# Patient Record
Sex: Male | Born: 1935 | Race: White | Hispanic: No | Smoking: Former smoker
Health system: Southern US, Community
[De-identification: ages and names within clinical notes are randomized; demographics above are authoritative.]

## PROBLEM LIST (undated history)

## (undated) DIAGNOSIS — K8020 Calculus of gallbladder without cholecystitis without obstruction: Secondary | ICD-10-CM

## (undated) DIAGNOSIS — F329 Major depressive disorder, single episode, unspecified: Secondary | ICD-10-CM

## (undated) DIAGNOSIS — K219 Gastro-esophageal reflux disease without esophagitis: Secondary | ICD-10-CM

## (undated) DIAGNOSIS — F419 Anxiety disorder, unspecified: Secondary | ICD-10-CM

## (undated) DIAGNOSIS — I2510 Atherosclerotic heart disease of native coronary artery without angina pectoris: Secondary | ICD-10-CM

## (undated) DIAGNOSIS — I639 Cerebral infarction, unspecified: Secondary | ICD-10-CM

## (undated) DIAGNOSIS — J449 Chronic obstructive pulmonary disease, unspecified: Secondary | ICD-10-CM

## (undated) DIAGNOSIS — N189 Chronic kidney disease, unspecified: Secondary | ICD-10-CM

## (undated) DIAGNOSIS — I10 Essential (primary) hypertension: Secondary | ICD-10-CM

## (undated) DIAGNOSIS — M549 Dorsalgia, unspecified: Secondary | ICD-10-CM

## (undated) DIAGNOSIS — R4189 Other symptoms and signs involving cognitive functions and awareness: Secondary | ICD-10-CM

## (undated) DIAGNOSIS — I219 Acute myocardial infarction, unspecified: Secondary | ICD-10-CM

## (undated) DIAGNOSIS — E785 Hyperlipidemia, unspecified: Secondary | ICD-10-CM

## (undated) DIAGNOSIS — R51 Headache: Secondary | ICD-10-CM

## (undated) HISTORY — DX: Calculus of gallbladder without cholecystitis without obstruction: K80.20

## (undated) HISTORY — DX: Gastro-esophageal reflux disease without esophagitis: K21.9

## (undated) HISTORY — DX: Headache: R51

## (undated) HISTORY — DX: Atherosclerotic heart disease of native coronary artery without angina pectoris: I25.10

## (undated) HISTORY — DX: Other symptoms and signs involving cognitive functions and awareness: R41.89

## (undated) HISTORY — DX: Major depressive disorder, single episode, unspecified: F32.9

## (undated) HISTORY — DX: Essential (primary) hypertension: I10

## (undated) HISTORY — DX: Dorsalgia, unspecified: M54.9

## (undated) HISTORY — DX: Chronic obstructive pulmonary disease, unspecified: J44.9

## (undated) HISTORY — DX: Anxiety disorder, unspecified: F41.9

## (undated) HISTORY — DX: Hyperlipidemia, unspecified: E78.5

---

## 1994-09-20 DIAGNOSIS — F419 Anxiety disorder, unspecified: Secondary | ICD-10-CM

## 1994-09-20 HISTORY — DX: Anxiety disorder, unspecified: F41.9

## 1995-09-21 HISTORY — PX: CORONARY STENT PLACEMENT: SHX1402

## 1998-01-02 ENCOUNTER — Ambulatory Visit (HOSPITAL_COMMUNITY): Admission: RE | Admit: 1998-01-02 | Discharge: 1998-01-02 | Payer: Self-pay | Admitting: Family Medicine

## 2001-08-10 ENCOUNTER — Encounter: Payer: Self-pay | Admitting: Family Medicine

## 2001-08-10 ENCOUNTER — Ambulatory Visit (HOSPITAL_COMMUNITY): Admission: RE | Admit: 2001-08-10 | Discharge: 2001-08-10 | Payer: Self-pay | Admitting: Family Medicine

## 2002-09-20 HISTORY — PX: CORONARY STENT PLACEMENT: SHX1402

## 2003-05-22 ENCOUNTER — Encounter: Payer: Self-pay | Admitting: Emergency Medicine

## 2003-05-22 ENCOUNTER — Inpatient Hospital Stay (HOSPITAL_COMMUNITY): Admission: EM | Admit: 2003-05-22 | Discharge: 2003-05-24 | Payer: Self-pay | Admitting: Emergency Medicine

## 2004-11-27 ENCOUNTER — Ambulatory Visit: Payer: Self-pay | Admitting: Pulmonary Disease

## 2004-12-24 ENCOUNTER — Ambulatory Visit: Payer: Self-pay | Admitting: Cardiology

## 2005-02-22 ENCOUNTER — Ambulatory Visit: Payer: Self-pay | Admitting: Pulmonary Disease

## 2006-01-19 ENCOUNTER — Ambulatory Visit: Payer: Self-pay | Admitting: Cardiology

## 2006-01-31 ENCOUNTER — Ambulatory Visit: Payer: Self-pay | Admitting: Pulmonary Disease

## 2006-10-17 ENCOUNTER — Ambulatory Visit (HOSPITAL_COMMUNITY): Admission: RE | Admit: 2006-10-17 | Discharge: 2006-10-17 | Payer: Self-pay | Admitting: Nephrology

## 2007-05-03 ENCOUNTER — Ambulatory Visit: Payer: Self-pay | Admitting: Cardiology

## 2008-05-08 ENCOUNTER — Ambulatory Visit: Payer: Self-pay | Admitting: Cardiology

## 2008-12-18 DIAGNOSIS — N200 Calculus of kidney: Secondary | ICD-10-CM | POA: Insufficient documentation

## 2008-12-18 DIAGNOSIS — F17200 Nicotine dependence, unspecified, uncomplicated: Secondary | ICD-10-CM

## 2008-12-18 DIAGNOSIS — I10 Essential (primary) hypertension: Secondary | ICD-10-CM

## 2008-12-18 DIAGNOSIS — I472 Ventricular tachycardia, unspecified: Secondary | ICD-10-CM | POA: Insufficient documentation

## 2008-12-19 ENCOUNTER — Ambulatory Visit: Payer: Self-pay | Admitting: Internal Medicine

## 2008-12-19 DIAGNOSIS — R0989 Other specified symptoms and signs involving the circulatory and respiratory systems: Secondary | ICD-10-CM

## 2008-12-19 DIAGNOSIS — R0609 Other forms of dyspnea: Secondary | ICD-10-CM

## 2008-12-19 DIAGNOSIS — J449 Chronic obstructive pulmonary disease, unspecified: Secondary | ICD-10-CM

## 2009-01-13 ENCOUNTER — Telehealth (INDEPENDENT_AMBULATORY_CARE_PROVIDER_SITE_OTHER): Payer: Self-pay | Admitting: *Deleted

## 2009-03-03 ENCOUNTER — Ambulatory Visit: Payer: Self-pay | Admitting: Internal Medicine

## 2009-03-03 DIAGNOSIS — R635 Abnormal weight gain: Secondary | ICD-10-CM

## 2009-05-29 ENCOUNTER — Ambulatory Visit: Payer: Self-pay | Admitting: Cardiology

## 2009-05-29 DIAGNOSIS — M549 Dorsalgia, unspecified: Secondary | ICD-10-CM | POA: Insufficient documentation

## 2009-07-21 ENCOUNTER — Encounter: Payer: Self-pay | Admitting: Cardiovascular Disease

## 2009-09-20 DIAGNOSIS — R4189 Other symptoms and signs involving cognitive functions and awareness: Secondary | ICD-10-CM

## 2009-09-20 HISTORY — DX: Other symptoms and signs involving cognitive functions and awareness: R41.89

## 2009-11-26 ENCOUNTER — Encounter: Payer: Self-pay | Admitting: Cardiovascular Disease

## 2010-03-09 ENCOUNTER — Ambulatory Visit: Payer: Self-pay | Admitting: Cardiovascular Disease

## 2010-03-25 ENCOUNTER — Encounter: Payer: Self-pay | Admitting: Cardiovascular Disease

## 2010-08-27 ENCOUNTER — Encounter: Payer: Self-pay | Admitting: Adult Health

## 2010-08-27 ENCOUNTER — Ambulatory Visit: Payer: Self-pay | Admitting: Cardiology

## 2010-08-31 ENCOUNTER — Telehealth (INDEPENDENT_AMBULATORY_CARE_PROVIDER_SITE_OTHER): Payer: Self-pay | Admitting: *Deleted

## 2010-09-08 ENCOUNTER — Encounter (INDEPENDENT_AMBULATORY_CARE_PROVIDER_SITE_OTHER): Payer: Self-pay | Admitting: *Deleted

## 2010-09-08 LAB — CONVERTED CEMR LAB
Albumin: 4.1 g/dL
BUN: 31 mg/dL
CO2: 21 meq/L
Chloride: 106 meq/L
Creatinine, Ser: 1.44 mg/dL
Total Protein: 7.1 g/dL

## 2010-10-11 ENCOUNTER — Encounter: Payer: Self-pay | Admitting: Nephrology

## 2010-10-20 NOTE — Assessment & Plan Note (Signed)
Summary: W1X   Visit Type:  Follow-up Referring Provider:  Self- Former Dr Sung Amabile pt. Primary Provider:  Dr. [name]  CC:  occasional sob.  History of Present Illness: Terry Flynn is seen today for F/U of CAD, Hypertension, elevated lipids and SOB.  He is a patient of Dr. Antoine Poche.  He has a history of stenting of the RCA I believe back in 2004.  He continues to smoke and has seen Dr. Sherene Sires for COPD.  His activity is severely limited by lower back and hip problems.  He is getting injections by Dr. Ethelene Hal at this point.  He would need a Dobutamine Myovue  to clear him for any major surgery in the future.  He is not having any significant SSCP but he can only walk about 20 ft because of his back and hip pain.  He breathing has been stable with no cough fever or sputum production.  He has been compliant with his meds  Current Problems (verified): 1)  Back Pain  (ICD-724.5) 2)  Weight Gain, Abnormal  (ICD-783.1) 3)  COPD Unspecified  (ICD-496) 4)  Dyspnea  (ICD-786.09) 5)  Nephrolithiasis  (ICD-592.0) 6)  Inguinal Hernia  (ICD-550.90) 7)  Hypertension  (ICD-401.9) 8)  Tobacco Abuse  (ICD-305.1) 9)  Dyslipidemia  (ICD-272.4) 10)  Ventricular Tachycardia  (ICD-427.1) 11)  Coronary Artery Disease  (ICD-414.00)  Current Medications (verified): 1)  Toprol Xl 25 Mg Xr24h-Tab (Metoprolol Succinate) .... Take 1 Tablet By Mouth Once A Day 2)  Colchicine 0.6 Mg Tabs (Colchicine) .Marland Kitchen.. 1 Once Daily As Needed 3)  Symbicort 160-4.5 Mcg/act  Aero (Budesonide-Formoterol Fumarate) .... 2 Puffs First Thing  in Am and 2 Puffs Again in Pm About 12 Hours Later 4)  Lorazepam 2 Mg Tabs (Lorazepam) .... Take 1 Tab Three Times A Day 5)  Proair Hfa 108 (90 Base) Mcg/act Aers (Albuterol Sulfate) .... Inhale 2 Puff Using Inhaler Every Four Hours 6)  Diovan 320 Mg Tabs (Valsartan) .... Take 1 Tab Daily 7)  Uloric 80 Mg Tabs (Febuxostat) .... Take 1 Tab Daily  Allergies (verified): No Known Drug Allergies  Past  History:  Past Medical History: Last updated: 03/03/2009 NEPHROLITHIASIS (ICD-592.0) INGUINAL HERNIA (ICD-550.90) HYPERTENSION (ICD-401.9) TOBACCO ABUSE (ICD-305.1) DYSLIPIDEMIA (ICD-272.4) VENTRICULAR TACHYCARDIA (ICD-427.1) CORONARY ARTERY DISEASE (ICD-414.00) COPD    - PFT's 02/22/05  FEV1 2.04 (76%) ratio 64  21% better after B2,  DLC0 78%     - PFT's 03/03/09          1.57(62%) ratio 58   11% better after B2 (after am symbicort) and DLC0 65  Past Surgical History: Last updated: 12/19/2008 Stent x 4  Family History: Last updated: 12/19/2008 Asthma- Son Heart Dz- 2 Brothers Throat CA- Father (smoker)  Social History: Last updated: 12/19/2008 Current smoker since age 45.  Smokes 1/2 to 1 ppd. Widowed Children Son lives with pt. Retired from [employer]  Review of Systems       Denies fever, malais, weight loss, blurry vision, decreased visual acuity, cough, sputum, SOB, hemoptysis, pleuritic pain, palpitaitons, heartburn, abdominal pain, melena, lower extremity edema, claudication, or rash.   Vital Signs:  Patient profile:   77 year old male Weight:      207 pounds BMI:     24.64 Pulse rate:   72 / minute BP sitting:   166 / 82  (right arm)  Vitals Entered By: Dreama Saa, CNA (March 09, 2010 9:24 AM)   Physical Exam  General:  Affect appropriate Healthy:  appears stated age HEENT: normal Neck supple with no adenopathy JVP normal no bruits no thyromegaly Lungs clear with no wheezing and good diaphragmatic motion Heart:  S1/S2 no murmur,rub, gallop or click PMI normal Abdomen: benighn, BS positve, no tenderness, no AAA no bruit.  No HSM or HJR Distal pulses intact with no bruits No edema Neuro non-focal Skin warm and dry    Impression & Recommendations:  Problem # 1:  CORONARY ARTERY DISEASE (ICD-414.00)  Stable no angina  continue ASA and BB His updated medication list for this problem includes:    Toprol Xl 25 Mg Xr24h-tab  (Metoprolol succinate) .Marland Kitchen... Take 1 tablet by mouth once a day  His updated medication list for this problem includes:    Toprol Xl 25 Mg Xr24h-tab (Metoprolol succinate) .Marland Kitchen... Take 1 tablet by mouth once a day  Problem # 2:  COPD UNSPECIFIED (ICD-496) Refills for inhalers called in .  f/U with [facility] as needed His updated medication list for this problem includes:    Symbicort 160-4.5 Mcg/act Aero (Budesonide-formoterol fumarate) .Marland Kitchen... 2 puffs first thing  in am and 2 puffs again in pm about 12 hours later    Proair Hfa 108 (90 Base) Mcg/act Aers (Albuterol sulfate) ..... Inhale 2 puff using inhaler every four hours  Problem # 3:  HYPERTENSION (ICD-401.9) Well controlled continue low sodium diet The following medications were removed from the medication list:    Benicar Hct 40-25 Mg Tabs (Olmesartan medoxomil-hctz) .Marland Kitchen... Take 1 tablet by mouth once a day His updated medication list for this problem includes:    Toprol Xl 25 Mg Xr24h-tab (Metoprolol succinate) .Marland Kitchen... Take 1 tablet by mouth once a day    Diovan 320 Mg Tabs (Valsartan) .Marland Kitchen... Take 1 tab daily  Problem # 4:  TOBACCO ABUSE (ICD-305.1) Counseled for less than 10 minutes regarding cessation.  No motivation to quit despite COPD  Problem # 5:  BACK PAIN (ICD-724.5) Arthritis in knees and back.  Ibupofen helps for now.  Meloxicam or other Cox-2 inhibitor may be needed in future.  f/U Roe Coombs Moore's office  Patient Instructions: 1)  Your physician recommends that you schedule a follow-up appointment in: 6 months 2)  Your physician recommends that you continue on your current medications as directed. Please refer to the Current Medication list given to you today. Prescriptions: PROAIR HFA 108 (90 BASE) MCG/ACT AERS (ALBUTEROL SULFATE) Inhale 2 puff using inhaler every four hours  #1 x 5   Entered by:   Larita Fife Via LPN   Authorized by:   Colon Branch, MD, [facility]   Signed by:   Larita Fife Via LPN on 60/45/4098   Method used:    [facility] to        [facility] [phone]* (retail)       [address]       [place], [place]  [address]       Ph: [phone] or [phone]       Fax: [phone]   RxID:   [id number] SYMBICORT 160-4.5 MCG/ACT  AERO (BUDESONIDE-FORMOTEROL FUMARATE) 2 puffs first thing  in am and 2 puffs again in pm about 12 hours later  #1 x 5   Entered by:   Larita Fife Via LPN   Authorized by:   Colon Branch, MD, [facility]   Signed by:   Larita Fife Via LPN on 56/38/7564   Method used:   [facility]  [address] [phone]* (retail)       [address]       [place], [place]  [address]       Ph: [phone] or [phone]       Fax: [phone]   RxID:   [id number]

## 2010-10-20 NOTE — Assessment & Plan Note (Signed)
Summary: Z6X   Visit Type:  Follow-up Referring Saliha Salts:  Self- Former Dr Sung Amabile pt. Primary Nayzeth Altman:  Dr. [name]  CC:  no cardiology complaints.  History of Present Illness: [name] is a  77 y/o CM with known history of CAD (stent to RCA 2004), hypertension, chronic back pain.and COPD.  Hecomes today for 6 months follow up for continued assessment and treatment of cardiac issues.  He denies any chest discomfort, DOE or weakness.  He is just getting over Gout and continues to have some back pain.  Otherwise he is without cardiac complaints. He is intolerant to statins causing myalgias.  Current Medications (verified): 1)  Toprol Xl 25 Mg Xr24h-Tab (Metoprolol Succinate) .... Take 1 Tablet By Mouth Once A Day 2)  Lorazepam 2 Mg Tabs (Lorazepam) .... Take 1 Tab Three Times A Day 3)  Proair Hfa 108 (90 Base) Mcg/act Aers (Albuterol Sulfate) .... Inhale 2 Puff Using Inhaler Every Four Hours 4)  Diovan 320 Mg Tabs (Valsartan) .... Take 1 Tab Daily 5)  Uloric 80 Mg Tabs (Febuxostat) .... Take 1 Tab Daily 6)  Indomethacin 25 Mg Caps (Indomethacin) .... Take 1 Tab Daily 7)  Aspirin 81 Mg Tbec (Aspirin) .... Take One Tablet By Mouth Daily 8)  Amlodipine Besylate 5 Mg Tabs (Amlodipine Besylate) .... Take One Tablet By Mouth Daily 9)  Fish Oil 1000 Mg Caps (Omega-3 Fatty Acids) .... Take 1 Tablet By Mouth Once A Day  Allergies (verified): 1)  ! * Statin  Comments:  Nurse/Medical Assistant: patient t brought meds he is no longer on colchicine or symbicort and doesn't take asa PMH-FH-SH reviewed-no changes except otherwise noted  Review of Systems       Gout pain, chronic back pain.  All other systems have been reviewed and are negative unless stated above.   Vital Signs:  Patient profile:   77 year old male Weight:      208 pounds O2 Sat:      94 % on Room air Pulse rate:   72 / minute BP sitting:   160 / 90  (right arm)  Vitals Entered By: Dreama Saa, CNA (August 27, 2010 11:02 AM)  O2 Flow:  Room air  Physical Exam  General:  Well developed, well nourished, in no acute distress. Lungs:  Clear bilaterally to auscultation and percussion. Heart:  Non-displaced PMI, chest non-tender; regular rate and rhythm, S1, S2 without murmurs, rubs or gallops. Carotid upstroke normal, no bruit. Normal abdominal aortic size, no bruits. Femorals normal pulses, no bruits. Pedals normal pulses. No edema, no varicosities. Abdomen:  Obese, NT 2+ bowel sounds Msk:  joint tenderness.  Right foot Pulses:  pulses normal in all 4 extremities Extremities:  No clubbing or cyanosis. Neurologic:  Alert and oriented x 3. Psych:  Normal affect.   EKG  Procedure date:  08/27/2010  Findings:      Normal sinus rhythm with rate of:  67 bpm  Impression & Recommendations:  Problem # 1:  HYPERTENSION (ICD-401.9)  Not welll controlled in patient with known CAD. Past BP was elevated in June.  Would not increase toprol as he has COPD, and is at low normal HR of 67.  Will begin Norvasc 5 mg daily. He will follow-up and have BP recheck in 1 week. His updated medication list for this problem includes:    Toprol Xl 25 Mg Xr24h-tab (Metoprolol succinate) .Marland Kitchen... Take 1 tablet by mouth once a day    Diovan 320  Mg Tabs (Valsartan) .Marland Kitchen... Take 1 tab daily    Aspirin 81 Mg Tbec (Aspirin) .Marland Kitchen... Take one tablet by mouth daily    Amlodipine Besylate 5 Mg Tabs (Amlodipine besylate) .Marland Kitchen... Take one tablet by mouth daily  Problem # 2:  DYSLIPIDEMIA (ICD-272.4) Intolerant to statins causing myalgias.  Will start fish oil 100mg  daily and instructed on low cholesterol diet.  Patient Instructions: 1)  Your physician recommends that you schedule a follow-up appointment in: 3 months. 2)  Your physician has recommended you make the following change in your medication: fish oil 1000mg  1 tablet daily, amlodipine 5mg  daily, asprin 81mg  daily 3)  You have been referred to 1 week nurse visit for BP  check Prescriptions: AMLODIPINE BESYLATE 5 MG TABS (AMLODIPINE BESYLATE) Take one tablet by mouth daily  #30 x 3   Entered by:   Teressa Lower RN   Authorized by:   Joni Reining, NP   Signed by:   Teressa Lower RN on 08/27/2010   Method used:   [facility]        [facility] [phone][facility] (retail)       [address]       [place], [place]  [address]       Ph: [phone] or [phone]       Fax: [phone]   RxID:   [id number]

## 2010-10-22 NOTE — Progress Notes (Signed)
Summary: Terry Flynn was told to call today with bp reading  Phone Note Call from Patient Call back at Home Phone [phone]   Caller: Terry Flynn Reason for Call: Talk to Nurse Summary of Call: Terry Flynn said he was told to call today after getting bp check his bp is 180/90. Terry Flynn has it checked at dr Christell Constant office. Initial call taken by: Faythe Ghee,  August 31, 2010 12:59 PM  Follow-up for Phone Call        [facility] Teressa Lower RN  September 01, 2010 9:32 AM Terry Flynn is scheduled to see Dr. Christell Constant on  Monday 09/07/2010 and will  have them send [place] the office note with bp readings  Follow-up by: Teressa Lower RN,  August 31, 2010 4:58 PM  Additional Follow-up for Phone Call Additional follow up Details #1::        S: Terry Flynn was to have a nurse visit yesterday B: office visit on 08/27/2010, started on fish oil 1000mg  once daily, amlodipine 5mg  daily and asa 81mg  daily A: Terry Flynn denies c/o, refused to come to nurse visit went to Dr. Kathi Der office for bp check and called our office to do visit over the phone. R: awaiting your recommendations Additional Follow-up by: Teressa Lower RN,  September 01, 2010 1:17 PM    Additional Follow-up for Phone Call Additional follow up Details #2::    Increase Norvasc to 10mg  daily.  Repeat BP check in one week for evaluation.    Follow-up by: Joni Reining NP  Additional Follow-up for Phone Call Additional follow up Details #3:: Details for Additional Follow-up Action Taken: lmom   Teressa Lower RN  September 02, 2010 1:31 PM   New/Updated Medications: AMLODIPINE BESYLATE 10 MG TABS (AMLODIPINE BESYLATE) Take one tablet by mouth daily Prescriptions: AMLODIPINE BESYLATE 10 MG TABS (AMLODIPINE BESYLATE) Take one tablet by mouth daily  #30 x 3   Entered by:   Teressa Lower RN   Authorized by:   Joni Reining, NP   Signed by:   Teressa Lower RN on 09/02/2010   Method used:   [facility] to        [facility] [phone]* (retail)       [address]       [place], [place]  [address]       Ph: [phone] or [phone]       Fax: [phone]   RxID:   [id number]

## 2010-11-25 ENCOUNTER — Encounter: Payer: Self-pay | Admitting: Cardiovascular Disease

## 2010-11-25 ENCOUNTER — Ambulatory Visit (INDEPENDENT_AMBULATORY_CARE_PROVIDER_SITE_OTHER): Payer: BC Managed Care – PPO | Admitting: Cardiovascular Disease

## 2010-11-25 DIAGNOSIS — I10 Essential (primary) hypertension: Secondary | ICD-10-CM

## 2010-11-25 DIAGNOSIS — E785 Hyperlipidemia, unspecified: Secondary | ICD-10-CM

## 2010-11-25 DIAGNOSIS — R519 Headache, unspecified: Secondary | ICD-10-CM | POA: Insufficient documentation

## 2010-11-25 DIAGNOSIS — I2510 Atherosclerotic heart disease of native coronary artery without angina pectoris: Secondary | ICD-10-CM

## 2010-11-25 DIAGNOSIS — K219 Gastro-esophageal reflux disease without esophagitis: Secondary | ICD-10-CM

## 2010-11-25 DIAGNOSIS — R51 Headache: Secondary | ICD-10-CM | POA: Insufficient documentation

## 2010-11-25 NOTE — Assessment & Plan Note (Signed)
No angina  Continue current medical Rx

## 2010-11-25 NOTE — Assessment & Plan Note (Signed)
Good control  With diet  Labs with pirmary.  No statins due to myalgias

## 2010-11-25 NOTE — Assessment & Plan Note (Signed)
OTC maalox and pepcid  F/U Dr Christell Constant

## 2010-11-25 NOTE — Assessment & Plan Note (Signed)
Improved with higher dose diovan

## 2010-11-25 NOTE — Patient Instructions (Signed)
F/U Dr Eden Emms 6 months Eye doctor tomorrow F/U primary Dr Christell Constant Maalox and OTC pepcid for GERD

## 2010-11-25 NOTE — Assessment & Plan Note (Signed)
Nonfocal exam with improved BP  Eye doctor tomorrow to R/O glaucoma  And F/U Dr Christell Constant

## 2010-11-25 NOTE — Progress Notes (Signed)
Terry Flynn is a  77 y/o CM with known history of CAD (stent to RCA 2004), hypertension, chronic back pain.and COPD.  He  comes today for 6 months follow up for continued assessment and treatment of cardiac issues.  He denies any chest discomfort, DOE or weakness.  He is just getting over Gout and continues to have some back pain.  Otherwise he is without cardiac complaints. He is intolerant to statins causing myalgias.  Has had headache since Saturday.  Has appt with eye doctor tomorrow to R/O glaucoma.  BP improved on higher dos Diovan.  Also has GERD no change in bowel habits nausea vomiting or melena.  Has F/U with Dr Christell Constant in [place] soon.    ROS: Denies fever, malais, weight loss, blurry vision, decreased visual acuity, cough, sputum, SOB, hemoptysis, pleuritic pain, palpitaitons, heartburn, abdominal pain, melena, lower extremity edema, claudication, or rash.   General:  Affect appropriate Healthy:  appears stated age HEENT: normal Neck supple with no adenopathy JVP normal no bruits no thyromegaly Lungs clear with no wheezing and good diaphragmatic motion Heart:  S1/S2 no murmur,rub, gallop or click PMI normal Abdomen: benighn, BS positve, no tenderness, no AAA no bruit.  No HSM or HJR Distal pulses intact with no bruits No edema Neuro non-focal Skin warm and dry No muscular weakness   Current outpatient prescriptions:albuterol (PROAIR HFA) 108 (90 BASE) MCG/ACT inhaler, Inhale 2 puffs into the lungs every 6 (six) hours as needed.  , Disp: , Rfl: ;  amLODipine (NORVASC) 10 MG tablet, Take 10 mg by mouth daily.  , Disp: , Rfl: ;  aspirin 81 MG chewable tablet, Chew 81 mg by mouth daily.  , Disp: , Rfl: ;  Febuxostat 80 MG TABS, Take 80 mg by mouth daily.  , Disp: , Rfl:  indomethacin (INDOCIN) 25 MG capsule, Take 25 mg by mouth 2 (two) times daily with a meal.  , Disp: , Rfl: ;  metoprolol succinate (TOPROL-XL) 25 MG 24 hr tablet, Take 25 mg by mouth daily.  , Disp: , Rfl: ;  valsartan  (DIOVAN) 320 MG tablet, Take 320 mg by mouth daily.  , Disp: , Rfl:  Statins   Allergies: Statins

## 2010-12-01 NOTE — Letter (Signed)
Summary: Work Writer at [employer]  [address], [place] [address]   Phone: [phone]  Fax: [phone]     November 25, 2010    Terry Flynn   The above named patient had a medical visit today at:  2:00pm and was brought to   clinic by Francoise Ceo. Please take this into consideration when reviewing the   time away from work.      Sincerely yours,  Architectural technologist

## 2010-12-01 NOTE — Assessment & Plan Note (Signed)
Summary: 3 mth f/u per pt checkout//tmj/tg   Visit Type:  Follow-up Referring Provider:  Self- Former Dr Sung Amabile pt. Primary Provider:  Dr. [name]  CC:  headache.  History of Present Illness: Terry Flynn is a  77 y/o CM with known history of CAD (stent to RCA 2004), hypertension, chronic back pain.and COPD.  Hecomes today for 6 months follow up for continued assessment and treatment of cardiac issues.  He denies any chest discomfort, DOE or weakness.  He is just getting over Gout and continues to have some back pain.  Otherwise he is without cardiac complaints. He is intolerant to statins causing myalgias.  Has had headache since Saturday.  Seeing the eye doctor tomorrow.  Also some epigastric pain and GERD.  No nausea, vohmiting melena or change in bowel habits.  BP better controlled on higher dose Diovan  Current Problems (verified): 1)  Back Pain  (ICD-724.5) 2)  Weight Gain, Abnormal  (ICD-783.1) 3)  COPD Unspecified  (ICD-496) 4)  Dyspnea  (ICD-786.09) 5)  Nephrolithiasis  (ICD-592.0) 6)  Inguinal Hernia  (ICD-550.90) 7)  Hypertension  (ICD-401.9) 8)  Tobacco Abuse  (ICD-305.1) 9)  Dyslipidemia  (ICD-272.4) 10)  Ventricular Tachycardia  (ICD-427.1) 11)  Coronary Artery Disease  (ICD-414.00)  Current Medications (verified): 1)  Toprol Xl 25 Mg Xr24h-Tab (Metoprolol Succinate) .... Take 1 Tablet By Mouth Once A Day 2)  Lorazepam 2 Mg Tabs (Lorazepam) .... Take 1 Tab Three Times A Day 3)  Proair Hfa 108 (90 Base) Mcg/act Aers (Albuterol Sulfate) .... Inhale 2 Puff Using Inhaler Every Four Hours 4)  Diovan 320 Mg Tabs (Valsartan) .... Take 1 Tab Daily 5)  Uloric 80 Mg Tabs (Febuxostat) .... Take 1 Tab Daily 6)  Indomethacin 25 Mg Caps (Indomethacin) .... Take 1 Tab Daily 7)  Aspirin 81 Mg Tbec (Aspirin) .... Take One Tablet By Mouth Daily 8)  Amlodipine Besylate 10 Mg Tabs (Amlodipine Besylate) .... Take One Tablet By Mouth Daily 9)  Fish Oil 1000 Mg Caps (Omega-3 Fatty  Acids) .... Take 1 Tablet By Mouth Once A Day  Allergies (verified): 1)  ! * Statin  Comments:  Nurse/Medical Assistant: no meds no list [facility] [place]  Past History:  Past Medical History: Last updated: 03/03/2009 NEPHROLITHIASIS (ICD-592.0) INGUINAL HERNIA (ICD-550.90) HYPERTENSION (ICD-401.9) TOBACCO ABUSE (ICD-305.1) DYSLIPIDEMIA (ICD-272.4) VENTRICULAR TACHYCARDIA (ICD-427.1) CORONARY ARTERY DISEASE (ICD-414.00) COPD    - PFT's 02/22/05  FEV1 2.04 (76%) ratio 64  21% better after B2,  DLC0 78%     - PFT's 03/03/09          1.57(62%) ratio 58   11% better after B2 (after am symbicort) and DLC0 65  Past Surgical History: Last updated: 12/19/2008 Stent x 4  Family History: Last updated: 12/19/2008 Asthma- Son Heart Dz- 2 Brothers Throat CA- Father (smoker)  Social History: Last updated: 12/19/2008 Current smoker since age 52.  Smokes 1/2 to 1 ppd. Widowed Children Son lives with pt. Retired from [employer]  Review of Systems       Denies fever, malais, weight loss, blurry vision, decreased visual acuity, cough, sputum, SOB, hemoptysis, pleuritic pain, palpitaitons, , abdominal pain, melena, lower extremity edema, claudication, or rash.   Vital Signs:  Patient profile:   77 year old male Weight:      204 pounds BMI:     24.28 Pulse rate:   68 / minute BP sitting:   132 / 79  (left arm)  Vitals Entered By: Dreama Saa,  CNA (November 25, 2010 1:55 PM)  Physical Exam  General:  Affect appropriate Healthy:  appears stated age HEENT: normal Neck supple with no adenopathy JVP normal no bruits no thyromegaly Lungs clear with no wheezing and good diaphragmatic motion Heart:  S1/S2 no murmur,rub, gallop or click PMI normal Abdomen: benighn, BS positve, no tenderness, no AAA no bruit.  No HSM or HJR Distal pulses intact with no bruits No edema Neuro non-focal Skin warm and dry    Impression & Recommendations:  Problem # 1:  HYPERTENSION  (ICD-401.9)  Improved with higher dose diovan His updated medication list for this problem includes:    Toprol Xl 25 Mg Xr24h-tab (Metoprolol succinate) .Marland Kitchen... Take 1 tablet by mouth once a day    Diovan 320 Mg Tabs (Valsartan) .Marland Kitchen... Take 1 tab daily    Aspirin 81 Mg Tbec (Aspirin) .Marland Kitchen... Take one tablet by mouth daily    Amlodipine Besylate 10 Mg Tabs (Amlodipine besylate) .Marland Kitchen... Take one tablet by mouth daily  His updated medication list for this problem includes:    Toprol Xl 25 Mg Xr24h-tab (Metoprolol succinate) .Marland Kitchen... Take 1 tablet by mouth once a day    Diovan 320 Mg Tabs (Valsartan) .Marland Kitchen... Take 1 tab daily    Aspirin 81 Mg Tbec (Aspirin) .Marland Kitchen... Take one tablet by mouth daily    Amlodipine Besylate 10 Mg Tabs (Amlodipine besylate) .Marland Kitchen... Take one tablet by mouth daily  Problem # 2:  DYSLIPIDEMIA (ICD-272.4) Continue diet Rx Myalgias with statins  Problem # 3:  CORONARY ARTERY DISEASE (ICD-414.00)  Stabel no angina continue asa and BB His updated medication list for this problem includes:    Toprol Xl 25 Mg Xr24h-tab (Metoprolol succinate) .Marland Kitchen... Take 1 tablet by mouth once a day    Aspirin 81 Mg Tbec (Aspirin) .Marland Kitchen... Take one tablet by mouth daily    Amlodipine Besylate 10 Mg Tabs (Amlodipine besylate) .Marland Kitchen... Take one tablet by mouth daily  His updated medication list for this problem includes:    Toprol Xl 25 Mg Xr24h-tab (Metoprolol succinate) .Marland Kitchen... Take 1 tablet by mouth once a day    Aspirin 81 Mg Tbec (Aspirin) .Marland Kitchen... Take one tablet by mouth daily    Amlodipine Besylate 10 Mg Tabs (Amlodipine besylate) .Marland Kitchen... Take one tablet by mouth daily  Problem # 4:  GERD (ICD-530.81) OTC maalox and pepcid  F/U Dr Christell Constant  Problem # 5:  HEADACHE (ICD-784.0) Nonfocal neuro exam  R/O glaucoma at eye doctor tomorrow  F/U Dr Christell Constant His updated medication list for this problem includes:    Toprol Xl 25 Mg Xr24h-tab (Metoprolol succinate) .Marland Kitchen... Take 1 tablet by mouth once a day    Indomethacin 25 Mg  Caps (Indomethacin) .Marland Kitchen... Take 1 tab daily    Aspirin 81 Mg Tbec (Aspirin) .Marland Kitchen... Take one tablet by mouth daily  Patient Instructions: 1)  Your physician recommends that you schedule a follow-up appointment in: 6 months 2)  Your physician has recommended you make the following change in your medication: you may take OTC Mylox or prevacid.

## 2011-02-02 NOTE — Assessment & Plan Note (Signed)
[facility]                            CARDIOLOGY OFFICE NOTE   NAME:Terry Flynn, Terry Flynn                     MRN:          [id number]  DATE:05/03/2007                            DOB:          05/22/1933    PRIMARY:  Dr. Vernon Prey   REASON FOR PRESENTATION:  Evaluate patient with coronary disease.   HISTORY OF PRESENT ILLNESS:  The patient presents for yearly followup.  He is now 77 years old.  He has had no new symptoms since I last saw  him.  He was evaluated by Dr. Kristian Covey for some renal insufficiency.  He  has had some chronic foot pain which he says limits his walking.  He  continues to smoke a few cigarettes a week when he gets stressed.  He  denies any ongoing chest discomfort, neck or arm discomfort.  He has had  no palpitation, presyncope or syncope.  He denies any PND or orthopnea.   PAST MEDICAL HISTORY:  1. Coronary artery disease (Status post stenting to his right coronary      artery in September 2004 with a Taxus stent.  He had an EF of 50%.      The patient had 90% stenosis, 30% proximal LAD stenosis, 90% first      diagonal stenosis as well as 50% obtuse marginal stenosis and 30%      mid circumflex stenosis).  2. Nonsustained ventricular tachycardia during admission.  3. Dyslipidemia.  4. Ongoing tobacco use.  5. Hypertension.  6. Bilateral inguinal hernia repair.  7. Nephrolithiasis.   ALLERGIES:  None.   MEDICATIONS:  1. Toprol-XL 25 mg daily.  2. Lexapro 10 mg q.h.s.  3. Zetia 10 mg daily.  4. Benicar 40/25 daily.   REVIEW OF SYSTEMS:  As stated in the HPI and otherwise negative for  other systems.   PHYSICAL EXAMINATION:  The patient is in no distress.  Blood pressure  112/72, heart rate 64 and regular.  HEENT:  Eyelids unremarkable.  Pupils equal, round, and react to light.  Fundi not visualized.  Oral mucosa unremarkable.  NECK:  No jugular venous distention.  Waveform within normal limits.  Carotid upstroke brisk  and symmetric.  No bruits, no thyromegaly.  LYMPHATICS:  No cervical, axillary or inguinal adenopathy.  LUNGS:  Clear to auscultation bilaterally.  BACK:  No costovertebral angle tenderness.  CHEST:  Unremarkable.  HEAR:  PMI not displaced or sustained.  S1 and S2 within normal limits.  No S3, no S4.  No clicks, rubs, murmurs.  ABDOMEN:  Flat, positive bowel sounds normal in frequent and pitch.  No  bruits, no rebound, no guarding.  No midline pulsatile mass.  No  hepatomegaly, no splenomegaly.  SKIN:  No rashes, no nodules.  EXTREMITIES:  Show 2+ upper pulses, 2+ femorals, 1+ dorsalis pedis and  posterior tibial bilaterally, dependent rubor, no cyanosis, no clubbing.  NEUROLOGIC:  Oriented to person, place, time.  Cranial nerves II-XII  grossly intact.  Motor grossly intact.   EKG:  Sinus rhythm, rate 64, axis within normal limits, intervals within  normal limits, no acute  ST-T wave changes.   ASSESSMENT AND PLAN:  1. Coronary disease.  The patient is having no ongoing symptoms.  No      further cardiovascular testing is suggested.  Will continue to try      to attempt secondary risk reduction.  2. Tobacco.  He understands the need to stop smoking completely,      though he does not think he can do this because he uses it to help      with his emotional stress.  3. Hypertension.  Blood pressure is well controlled and he will      continue the medications as listed.  4. Dyslipidemia.  He has not tolerated statins in the past.  This is      followed by Dr. Christell Constant.  5. Followup.  Will see the patient back in 1 year or sooner if needed.     [name], MD, [facility]  Electronically Signed    JH/MedQ  DD: 05/03/2007  DT: 05/04/2007  Job #: [id number]   cc:   Ernestina Penna, M.D.

## 2011-02-02 NOTE — Assessment & Plan Note (Signed)
[facility]                            CARDIOLOGY OFFICE NOTE   NAME:Terry Flynn, Terry Flynn                     MRN:          [id number]  DATE:05/08/2008                            DOB:          10-26-1932    PRIMARY CARE PHYSICIAN:  Ernestina Penna, MD   REASON FOR PRESENTATION:  Evaluate the patient with coronary artery  disease.   HISTORY OF PRESENT ILLNESS:  The patient presents for yearly followup.  Since I last saw him, he has done well.  He has not had any new  cardiovascular symptoms.  In particular, he has not had any of the chest  discomfort that he had at the time of his previous interventions.  He  has had no chest pressure, neck, or arm discomfort.  He has had no  palpitations, presyncope, or syncope.  He gets no overt shortness of  breath and denies any PND or orthopnea.  He is fairly limited but gets  around [place] and walking to and from his car.  He has some gout in his  feet and heel and spurs.   PAST MEDICAL HISTORY:  1. Coronary artery disease (status post stenting to his right coronary      artery in September 2004 with a Taxus stent.  His EF was 50%.  He      had a 90% stenosis in the RCA, 30% proximal LAD stenosis, 90% first      diagonal stenosis as well as 50% obtuse marginal stenosis, and 30%      mid circumflex stenosis).  2. Nonsustained ventricular tachycardia at the time of that admission.  3. Dyslipidemia.  4. Ongoing tobacco use (a third pack per day).  5. Hypertension.  6. Bilateral inguinal hernia repair.  7. Nephrolithiasis.   ALLERGIES:  None.   MEDICATIONS:  1. Benicar 40/25 daily.  2. Toprol 25 mg daily.  (The patient was on statins and on Zetia but      quit these because of muscle aches).   REVIEW OF SYSTEMS:  As stated in the HPI and negative for all other  systems.   PHYSICAL EXAMINATION:  GENERAL:  The patient is in no distress.  VITAL SIGNS:  Blood pressure 112/72, heart rate 73 and regular,  weight  205 pounds, and body mass index 31.  HEENT:  Eyes are unremarkable.  Pupils equal, round, and reactive to  light.  Fundi not visualized.  Oral mucosa unremarkable.  NECK:  No jugular venous distention at 45 degrees.  Carotid upstroke  brisk and symmetric.  No bruits and no thyromegaly.  LYMPHATICS:  No cervical, axillary, or inguinal adenopathy.  LUNGS:  Clear to auscultation bilaterally.  BACK:  No costovertebral angle tenderness.  CHEST:  Unremarkable.  HEART:  PMI not displaced or sustained.  S1 and S2 within normal limits.  No S3, no S4, no clicks, no rubs, and no murmurs.  ABDOMEN:  Flat,  positive bowel sounds normal in frequency and pitch.  No bruits,  rebound, or guarding.  No midline pulsatile mass.  No hepatomegaly and  no splenomegaly.  SKIN:  No  rashes and no nodules.  EXTREMITIES:  A 2+ upper pulses, 2+ femorals, 1+ dorsalis pedis, and  posterior tibialis bilaterally.  Dependent rubor.  Trace left lower  extremity edema.  No cyanosis or clubbing.  NEURO:  Oriented to person, place, and time.  Cranial nerves II through  XII grossly intact.  Motor grossly intact.   EKG, sinus rhythm, rate 73, axes within normal limits, intervals within  normal limits, and no acute ST-T wave changes.   ASSESSMENT AND PLAN:  1. Coronary artery disease.  The patient is having no overt symptoms.      However, he has a limited functional capacity.  It has been 5 years      since his PTCA.  He needs stress perfusion imaging to further      evaluate his coronaries.  He wants to defer this until after the      first of the year.  He needs continued risk reduction.  2. Tobacco.  I discussed with him the need to stop smoking (greater      than 3 minutes).  He wants to quit cold [place].  He plans to do      this and I think he is motivated.  3. Hypertension.  Blood pressure is controlled, and he will continue      the medications as listed.  4. Dyslipidemia.  He has not tolerated statins.   He has not tried      pravastatin, and I am going to start this at 40 mg once a day.  He      will be given instructions to get lipid profile in 8 weeks.  5. Obesity.  He understands the need to lose weight with diet and      exercise.  I think I have him convinced to borrow a friend's      stationary bicycle.  6. Follow-up.  I will see him back in 1 year or sooner if needed.  We      will get the results of the stress perfusion study if he consents      to having this done.     [name], MD, [facility]  Electronically Signed    JH/MedQ  DD: 05/08/2008  DT: 05/09/2008  Job #: [id number]   cc:   Ernestina Penna, M.D.

## 2011-02-05 NOTE — Cardiovascular Report (Signed)
NAME:  Terry Flynn, Terry Flynn                        ACCOUNT NO.:  [id number]   MEDICAL RECORD NO.:  [id number]                   PATIENT TYPE:  INP   LOCATION:  2930                                 FACILITY:  [facility]   PHYSICIAN:  Charlies Constable, M.D.                  DATE OF BIRTH:  [birth year]   DATE OF PROCEDURE:  05/22/2003  DATE OF DISCHARGE:                              CARDIAC CATHETERIZATION   PRIMARY CARE PHYSICIAN:  Ernestina Penna, M.D.   PROCEDURE:  Cardiac catheterization and percutaneous intervention.   CLINICAL HISTORY:  Terry Flynn is 77 years old and had a remote stenting of  the right coronary artery seven years ago.  He presented to Ernestina Penna,  M.D. office with chest pain and EKG changes and acute inferior wall MI and  was transferred to [facility] for further treatment.  He was seen in the  emergency room by Willa Rough, M.D. where he developed junctional  bradycardia and then atrial fibrillation with marked bradycardia and  hypotension with blood pressures in the 70s and 80s.  He was transported  emergently to the cardiac catheterization laboratory.  He is a smoker and  has a history of hypertension and hyperlipidemia.   PROCEDURE:  The procedure was performed via the right femoral artery  utilizing an arterial sheath and 6-French preformed coronary catheters.  A  temporary pacemaker was passed via the right femoral vein to the right  ventricle before any angiogram was performed.  He was given fluids and  dopamine.  His initial blood pressure just at the time of starting the  dopamine after we inserted the pacemaker was approximately 90.  After  completion of the diagnostic pictures, we made a decision to proceed with  intervention on the right coronary artery.  The patient was consented and  enrolled in the __________ trial and received bivalirudin.  He also received  600 of Plavix, but no 2B3A inhibitor.   We used a 6-French JR4 guiding catheter with side  holes and a short Luge  wire.  We crossed the lesion with the wire without difficulty.  We initially  pre dilated with a 3.0 x 20 mm Maverick performing one inflation up to 8  atmospheres for 30 seconds.  We then deployed a 3.0 x 32 mm Taxus stent  deploying this with one inflation up to 12 atmospheres for 30 seconds.  We  then post dilated with a 3.25 x 20 mm Quantum Maverick performing three  inflations up to 16 atmospheres for 30 seconds.  We had a great deal of  difficulty getting the Maverick into the stent due to catching on the  proximal edge and had to use a buddy wire to accomplish this.   We then approached the lesion in the distal right coronary artery which  extended into the posterior descending branch of the right coronary artery.  We direct stented this  with a 2.5 x 20 mm Taxus stent deploying this with  one inflation of 10 atmospheres for 30 seconds.  The stent crossed a  posterolateral branch but there did not appear to be ostial lesion in the  posterolateral branch and we felt that we could get by without compromising  this vessel.  We did develop some slow flow after placing this second stent  in both the posterolateral and posterior descending branch and this  responded to intracoronary verapamil.  We then post dilated with a 2.5 x 15  mm Quantum Maverick performing two inflations up to 14 atmospheres for 30  seconds.  Repeat diagnostic studies were then performed through the guiding  catheter.  The patient tolerated the procedure well and left the laboratory  in satisfactory condition.   RESULTS:  Left main coronary artery:  Free of significant disease.   Left anterior descending artery:  Gave rise to two septal perforators and a  moderately large diagonal branch.  There was 30% proximal stenosis in the  LAD.  There was 90% ostial stenosis in the diagonal branch.   Circumflex artery:  Gave rise to a marginal branch and two small  posterolateral branches.  There was  50% ostial and 40% distal stenosis in  the marginal branch and 30% narrowing in the mid and distal circumflex  arteries.   Right coronary artery:  Moderately large vessel that gave rise to a conus  branch, a right ventricular branch, a posterior descending branch, and a  posterolateral branch.  There was a 99% stenosis in the proximal right  coronary artery within the previous stented segment.  There was TIMI 2 flow.  There was also a 90% lesion in the distal right coronary artery that  extended into the posterior descending branch.   LEFT VENTRICULOGRAM:  The left ventriculogram performed in the RAO  projection showed good slight hypokinesis of the inferobasal wall.  The  overall wall motion was good with an estimated ejection fraction was 60%.   Following stenting of the lesion in the proximal right coronary artery  within the stent the stenosis improved from 99% to less than 10% and the  flow improved from TIMI 2 to TIMI 3 flow.   Following stenting of the lesion in the distal right coronary artery the  stenosis improved from 90% to less than 10%.   The patient had the onset of chest pain at 11 a.m. and arrived at our  emergency room at 12:35 p.m.  The first balloon inflation was at 1352.  This  gave a total balloon time of one hour and 28 minutes and a reperfusion time  of two hours and 52 minutes.   CONCLUSIONS:  1. Acute diaphragmatic wall infarction with 99% stenosis within an old stent     in the proximal right coronary artery, 90% stenosis in the distal right     coronary artery, 30% narrowing in the proximal left anterior descending,     90% narrowing in a diagonal branch of the left anterior descending, 50%     narrowing in a marginal branch of the circumflex artery with 30%     narrowing in the mid and distal circumflex artery, and inferobasal     hypokinesis with an estimated ejection fraction of 60%. 2. Successful stenting of the proximal lesion for in-stent  restenosis in the     proximal right coronary artery with improvement of center of narrowing     from 99% to less than 10%  and improvement in the flow from TIMI 2 to TIMI     3 flow.  3. Successful stenting of the distal right coronary artery with improvement     in center of narrowing from 90% to less than 10%.   DISPOSITION:  The patient was returned to post anesthesia unit for further  observation.  The long-term outlook is reasonably good with well preserved  left ventricular function and predominantly single vessel disease.                                               Charlies Constable, M.D.    BB/MEDQ  D:  05/22/2003  T:  05/23/2003  Job:  [id number]   cc:   Ernestina Penna, M.D.  [address] [place]  [place] [address]  Fax: [phone]   [facility]

## 2011-02-05 NOTE — Discharge Summary (Signed)
NAME:  ENIO, HORNBACK NO.:  [id number]   MEDICAL RECORD NO.:  [id number]                   PATIENT TYPE:  INP   LOCATION:  2009                                 FACILITY:  [facility]   PHYSICIAN:  Willa Rough, M.D.                  DATE OF BIRTH:  1933/06/08   DATE OF ADMISSION:  05/22/2003  DATE OF DISCHARGE:  05/24/2003                           DISCHARGE SUMMARY - REFERRING   PROCEDURE:  Emergent coronary artery stenting on May 22, 2003.   REASON FOR ADMISSION:  Mr. Serviss is a 77 year old male, with known  coronary artery disease status post previous stenting, who initially  presented to his primary care physician's office with a complaint of chest  pain associated with diaphoresis and nausea.  An electrocardiogram was  consistent with acute inferior myocardial infarction.  The patient was  stabilized and transferred via EMS to [facility].  On arrival, the  patient was in sinus bradycardia and subsequently developed a brief episode  of paroxysmal atrial fibrillation.  He also was noted to have a very short  PR interval while in sinus rhythm.  He was placed on intravenous  nitroglycerin and heparin, but continued to have chest pain and, following  evaluation by Dr. Willa Rough, directed to the cardiac catheterization lab  for emergent intervention.   LABORATORY DATA:  Hemoglobin 11.5, hematocrit 34.1, platelets 148, WBC 8.1  at discharge, normal metabolic profile prior to discharge.  Lipid profile:  Total cholesterol 114, triglyceride 109, HDL 32, LDL 60 (ratio 3.6).  Cardiac enzymes:  CPK 376/81 (22%), troponin I less than 0.05.  liver  enzymes normal.   Admission chest x-ray:  Tortuous thoracic aorta with fullness in superior  aspect of right hilum.   HOSPITAL COURSE:  Emergent coronary angiogram was performed, by Dr. Charlies Constable (see report for full details), revealing subcritical end stent  restenosis of the proximal RCA as  well as a 90% distal lesion.  The residual  anatomy notable for 30% proximal LAD; 90% first diagonal, 50% first obtuse  marginal; 30% mid circumflex.  LV function was preserved (60%).   Dr. Juanda Chance proceeded with successful stenting (TAXUS) of the 99% proximal  and 9% distal RCA lesions, both of less than 10% residual stenosis.   Postoperative course essentially benign.  The patient had no complaints of  recurrent chest discomfort.   The patient did have a followup two view chest x-ray, following the abnormal  portable chest x-ray (see lab data).  This continued to report suspicion for  right hilar mass.  Therefore, a followup chest CT scan was ordered.  This,  however, was negative for a mass.   There was also a question of tachyarrhythmia.  However, upon further review,  this consisted of a two beat run of ventricular tachycardia over a monitored  24 hour period.  There also was no evidence of ventricular fibrillation.  As noted earlier, the patient did develop transient atrial fibrillation  while in the emergency room which degenerated into a junctional rhythm.  Postoperative course, however, was devoid of any recurrent dysrhythmia.   The patient was cleared for discharge on hospital day #2.   DISCHARGE MEDICATIONS:  1. Plavix 75 mg daily (at least six months, new).  2. Coated aspirin 325 mg daily.  3. Pravachol 40 mg daily.  4. Benicar 10 mg daily.  5. Toprol XL 25 mg daily (new).  6. Nitrostat 0.4 mg p.r.n.   DISCHARGE INSTRUCTIONS:  1. No strenuous activity, driving, or return to work until seen by     physician.  2. Maintain low fat/cholesterol diet.  3. Stop smoking tobacco.   FOLLOWUP:  Follow up with Dr. Tinnie Gens Katz/Michael R. Duran, P.A.-C. on  Monday, June 03, 2003 at 2:15 p.m. at [facility] at  [facility].   DISCHARGE DIAGNOSES:  1. Status post acute inferior myocardial infarction.     A. Emergent stent (TAXUS) proximal/distal  right coronary artery.     B. Residual nonobstructive left anterior descending and circumflex        disease; 90% first diagonal.     C. Normal left ventricular function.  2. Nonsustained ventricular tachycardia.  3. Dyslipidemia.  4. Tobacco.  5. History of hypertension.      Gene Serpe, P.A. [facility]                      Willa Rough, M.D.    GS/MEDQ  D:  05/24/2003  T:  05/24/2003  Job:  [id number]   cc:   Ernestina Penna, M.D.  [address] [place]  [place] [address]  Fax: [phone]   [facility]  [address]  [place]  [place] [address]

## 2011-02-14 ENCOUNTER — Emergency Department (HOSPITAL_COMMUNITY): Payer: Medicare Other

## 2011-02-14 ENCOUNTER — Emergency Department (HOSPITAL_COMMUNITY)
Admission: EM | Admit: 2011-02-14 | Discharge: 2011-02-14 | Disposition: A | Discharge status: Home / Self Care | Payer: Medicare Other | Attending: Emergency Medicine | Admitting: Emergency Medicine

## 2011-02-14 DIAGNOSIS — R51 Headache: Secondary | ICD-10-CM | POA: Insufficient documentation

## 2011-02-14 LAB — DIFFERENTIAL
Basophils Absolute: 0 10*3/uL (ref 0.0–0.1)
Basophils Relative: 0 % (ref 0–1)
Lymphocytes Relative: 20 % (ref 12–46)
Monocytes Relative: 8 % (ref 3–12)
Neutro Abs: 6.4 10*3/uL (ref 1.7–7.7)
Neutrophils Relative %: 70 % (ref 43–77)

## 2011-02-14 LAB — COMPREHENSIVE METABOLIC PANEL
ALT: 11 U/L (ref 0–53)
AST: 15 U/L (ref 0–37)
Alkaline Phosphatase: 108 U/L (ref 39–117)
CO2: 25 mEq/L (ref 19–32)
Chloride: 98 mEq/L (ref 96–112)
GFR calc Af Amer: >60 mL/min (ref >60)
GFR calc non Af Amer: 57 mL/min — ABNORMAL LOW (ref >60)
Sodium: 133 mEq/L — ABNORMAL LOW (ref 135–145)
Total Bilirubin: 0.4 mg/dL (ref 0.3–1.2)

## 2011-02-14 LAB — CBC
Hemoglobin: 15.9 g/dL (ref 13.0–17.0)
RBC: 5.08 MIL/uL (ref 4.22–5.81)

## 2011-05-28 ENCOUNTER — Encounter: Payer: Self-pay | Admitting: Adult Health

## 2011-06-01 ENCOUNTER — Encounter: Payer: Self-pay | Admitting: Adult Health

## 2011-06-01 ENCOUNTER — Ambulatory Visit (INDEPENDENT_AMBULATORY_CARE_PROVIDER_SITE_OTHER): Payer: Medicare Other | Admitting: Adult Health

## 2011-06-01 DIAGNOSIS — I2510 Atherosclerotic heart disease of native coronary artery without angina pectoris: Secondary | ICD-10-CM

## 2011-06-01 DIAGNOSIS — I10 Essential (primary) hypertension: Secondary | ICD-10-CM

## 2011-06-01 DIAGNOSIS — E7800 Pure hypercholesterolemia, unspecified: Secondary | ICD-10-CM

## 2011-06-01 NOTE — Assessment & Plan Note (Signed)
He is doing very well without cardiac complaint.  Denies any chest pain or exertional discomfort.  His breathing status is sometimes compromised secondary to COPD and this causes some outside heat intolerance. Otherwise is doing well. I will make no changes at this time. He is to call for any changes or concerns.

## 2011-06-01 NOTE — Progress Notes (Signed)
HPI: [name] is pleasant 77 y/o patient of Dr. Dietrich Flynn, we are following for ongoing assessment and treatment of CAD (stent to RCA in 2004) hypertension, COPD and hypercholesterolemia (intolerant to statins). He has been doing well without complaints. He has no new allergies or hospitalizations. He did go to ER in August of 2012 for a migraine headache.  He is followed by Dr. Rudi Flynn for his cholesterol status and is on Fish Oil tablets daily. Heis tolerating Norvasc without complaint with the exception of mild ankle edema.   Allergies  Allergen Reactions  . Statins     REACTION: MYALGIA    Current Outpatient Prescriptions  Medication Sig Dispense Refill  . albuterol (PROAIR HFA) 108 (90 BASE) MCG/ACT inhaler Inhale 2 puffs into the lungs every 6 (six) hours as needed.        Marland Kitchen amLODipine (NORVASC) 10 MG tablet Take 10 mg by mouth daily.        Marland Kitchen aspirin 81 MG chewable tablet Chew 81 mg by mouth daily.        . Febuxostat 80 MG TABS Take 80 mg by mouth daily.        . metoprolol succinate (TOPROL-XL) 25 MG 24 hr tablet Take 25 mg by mouth daily.        . valsartan (DIOVAN) 320 MG tablet Take 320 mg by mouth daily.          Past Medical History  Diagnosis Date  . Back pain   . HTN (hypertension)   . CAD (coronary artery disease)   . COPD (chronic obstructive pulmonary disease)     Stent to RCA 2004  . GERD (gastroesophageal reflux disease)   . Headache     Past Surgical History  Procedure Date  . Coronary stent placement     x4    ZOX:WRUEAV of systems complete and found to be negative unless listed above PHYSICAL EXAM BP 118/69  Pulse 64  Resp 18  Ht 6\' 5"  (1.956 m)  Wt 203 lb (92.08 kg)  BMI 24.07 kg/m2  SpO2 96% General: Well developed, well nourished, in no acute distress Head: Eyes PERRLA, No xanthomas.   Normal cephalic and atramatic  Lungs: Clear bilaterally to auscultation and percussion. Heart: HRRR S1 S2, without MRG.  Pulses are 2+ & equal.    No carotid bruit. No JVD.  No abdominal bruits. No femoral bruits. Abdomen: Bowel sounds are positive, abdomen soft and non-tender without masses or                  Hernia's noted. Msk:  Back normal, normal gait. Normal strength and tone for age. Extremities: No clubbing, cyanosis or edema.  DP +1 Neuro: Alert and oriented X 3. Psych:  Good affect, responds appropriately    ASSESSMENT AND PLAN

## 2011-06-01 NOTE — Assessment & Plan Note (Signed)
Blood pressure is well controlled at present.  No changes in medications. He is due to see Dr. Christell Constant this month. He is to have labs drawn at that time.  Will request copy for evaluation of kidney fx on multiple medications.

## 2011-06-01 NOTE — Patient Instructions (Signed)
**Note De-identified  Obfuscation** Your physician recommends that you continue on your current medications as directed. Please refer to the Current Medication list given to you today.  Your physician recommends that you schedule a follow-up appointment in: 6 MONTHS  

## 2011-06-22 ENCOUNTER — Encounter: Payer: Self-pay | Admitting: Cardiology

## 2011-08-27 ENCOUNTER — Emergency Department (HOSPITAL_COMMUNITY): Payer: Medicare Other

## 2011-08-27 ENCOUNTER — Inpatient Hospital Stay (HOSPITAL_COMMUNITY)
Admission: EM | Admit: 2011-08-27 | Discharge: 2011-08-31 | DRG: 065 | Disposition: A | Discharge status: Home Health | Payer: Medicare Other | Attending: Internal Medicine | Admitting: Internal Medicine

## 2011-08-27 ENCOUNTER — Other Ambulatory Visit: Payer: Self-pay

## 2011-08-27 ENCOUNTER — Encounter (HOSPITAL_COMMUNITY): Payer: Self-pay | Admitting: Emergency Medicine

## 2011-08-27 DIAGNOSIS — F17200 Nicotine dependence, unspecified, uncomplicated: Secondary | ICD-10-CM | POA: Diagnosis present

## 2011-08-27 DIAGNOSIS — G4700 Insomnia, unspecified: Secondary | ICD-10-CM | POA: Diagnosis present

## 2011-08-27 DIAGNOSIS — I2510 Atherosclerotic heart disease of native coronary artery without angina pectoris: Secondary | ICD-10-CM | POA: Diagnosis present

## 2011-08-27 DIAGNOSIS — R296 Repeated falls: Secondary | ICD-10-CM

## 2011-08-27 DIAGNOSIS — Z888 Allergy status to other drugs, medicaments and biological substances status: Secondary | ICD-10-CM

## 2011-08-27 DIAGNOSIS — N189 Chronic kidney disease, unspecified: Secondary | ICD-10-CM | POA: Diagnosis present

## 2011-08-27 DIAGNOSIS — N179 Acute kidney failure, unspecified: Secondary | ICD-10-CM | POA: Diagnosis present

## 2011-08-27 DIAGNOSIS — J449 Chronic obstructive pulmonary disease, unspecified: Secondary | ICD-10-CM

## 2011-08-27 DIAGNOSIS — I6350 Cerebral infarction due to unspecified occlusion or stenosis of unspecified cerebral artery: Principal | ICD-10-CM | POA: Diagnosis present

## 2011-08-27 DIAGNOSIS — K219 Gastro-esophageal reflux disease without esophagitis: Secondary | ICD-10-CM | POA: Diagnosis present

## 2011-08-27 DIAGNOSIS — Z9861 Coronary angioplasty status: Secondary | ICD-10-CM

## 2011-08-27 DIAGNOSIS — F329 Major depressive disorder, single episode, unspecified: Secondary | ICD-10-CM | POA: Diagnosis present

## 2011-08-27 DIAGNOSIS — I639 Cerebral infarction, unspecified: Secondary | ICD-10-CM | POA: Diagnosis present

## 2011-08-27 DIAGNOSIS — Z79899 Other long term (current) drug therapy: Secondary | ICD-10-CM

## 2011-08-27 DIAGNOSIS — F3289 Other specified depressive episodes: Secondary | ICD-10-CM | POA: Diagnosis present

## 2011-08-27 DIAGNOSIS — E785 Hyperlipidemia, unspecified: Secondary | ICD-10-CM | POA: Diagnosis present

## 2011-08-27 DIAGNOSIS — I129 Hypertensive chronic kidney disease with stage 1 through stage 4 chronic kidney disease, or unspecified chronic kidney disease: Secondary | ICD-10-CM | POA: Diagnosis present

## 2011-08-27 DIAGNOSIS — Z9181 History of falling: Secondary | ICD-10-CM

## 2011-08-27 DIAGNOSIS — I10 Essential (primary) hypertension: Secondary | ICD-10-CM | POA: Diagnosis present

## 2011-08-27 DIAGNOSIS — F411 Generalized anxiety disorder: Secondary | ICD-10-CM | POA: Diagnosis present

## 2011-08-27 DIAGNOSIS — J441 Chronic obstructive pulmonary disease with (acute) exacerbation: Secondary | ICD-10-CM | POA: Diagnosis present

## 2011-08-27 DIAGNOSIS — Z7902 Long term (current) use of antithrombotics/antiplatelets: Secondary | ICD-10-CM

## 2011-08-27 HISTORY — DX: Chronic kidney disease, unspecified: N18.9

## 2011-08-27 LAB — HEPATIC FUNCTION PANEL
ALT: 15 U/L (ref 0–53)
AST: 35 U/L (ref 0–37)
Albumin: 3.2 g/dL — ABNORMAL LOW (ref 3.5–5.2)
Alkaline Phosphatase: 78 U/L (ref 39–117)
Alkaline Phosphatase: 67 U/L (ref 39–117)
Bilirubin, Direct: <0.1 mg/dL (ref 0.0–0.3)
Total Bilirubin: 0.3 mg/dL (ref 0.3–1.2)
Total Bilirubin: 0.2 mg/dL — ABNORMAL LOW (ref 0.3–1.2)
Total Protein: 7 g/dL (ref 6.0–8.3)

## 2011-08-27 LAB — DIFFERENTIAL
Basophils Absolute: 0 10*3/uL (ref 0.0–0.1)
Basophils Relative: 0 % (ref 0–1)
Monocytes Absolute: 0.7 10*3/uL (ref 0.1–1.0)
Neutro Abs: 5.5 10*3/uL (ref 1.7–7.7)
Neutrophils Relative %: 72 % (ref 43–77)

## 2011-08-27 LAB — BASIC METABOLIC PANEL
Chloride: 103 mEq/L (ref 96–112)
Creatinine, Ser: 1.66 mg/dL — ABNORMAL HIGH (ref 0.50–1.35)
GFR calc Af Amer: 45 mL/min — ABNORMAL LOW (ref >90)
Potassium: 4 mEq/L (ref 3.5–5.1)

## 2011-08-27 LAB — PROTIME-INR: Prothrombin Time: 13.2 seconds (ref 11.6–15.2)

## 2011-08-27 LAB — CBC
MCHC: 33.7 g/dL (ref 30.0–36.0)
Platelets: 113 10*3/uL — ABNORMAL LOW (ref 150–400)
RDW: 14.9 % (ref 11.5–15.5)

## 2011-08-27 LAB — CK TOTAL AND CKMB (NOT AT ARMC): Total CK: 2152 U/L — ABNORMAL HIGH (ref 7–232)

## 2011-08-27 MED ORDER — ASPIRIN 81 MG PO CHEW
81.0000 mg | CHEWABLE_TABLET | Freq: Every day | ORAL | Status: DC
  Administered 2011-08-27: 81 mg via ORAL
  Filled 2011-08-27: qty 1

## 2011-08-27 MED ORDER — ASPIRIN 81 MG PO CHEW
CHEWABLE_TABLET | ORAL | Status: AC
  Administered 2011-08-27: 81 mg via ORAL
  Filled 2011-08-27: qty 1

## 2011-08-27 MED ORDER — GUAIFENESIN 100 MG/5ML PO SOLN
5.0000 mL | ORAL | Status: DC | PRN
  Filled 2011-08-27: qty 5

## 2011-08-27 MED ORDER — CLOPIDOGREL BISULFATE 75 MG PO TABS
75.0000 mg | ORAL_TABLET | Freq: Every day | ORAL | Status: DC
  Administered 2011-08-28 – 2011-08-31 (×4): 75 mg via ORAL
  Filled 2011-08-27 (×5): qty 1

## 2011-08-27 MED ORDER — ACETAMINOPHEN 325 MG PO TABS
650.0000 mg | ORAL_TABLET | Freq: Four times a day (QID) | ORAL | Status: DC
  Administered 2011-08-27 – 2011-08-30 (×10): 650 mg via ORAL
  Filled 2011-08-27 (×6): qty 2

## 2011-08-27 MED ORDER — IPRATROPIUM BROMIDE 0.02 % IN SOLN
0.5000 mg | RESPIRATORY_TRACT | Status: AC
  Administered 2011-08-27 – 2011-08-28 (×3): 0.5 mg via RESPIRATORY_TRACT
  Filled 2011-08-27 (×3): qty 2.5

## 2011-08-27 MED ORDER — SENNOSIDES-DOCUSATE SODIUM 8.6-50 MG PO TABS: 1.0000 | ORAL_TABLET | Freq: Every evening | ORAL | Status: DC | PRN

## 2011-08-27 MED ORDER — DOXYCYCLINE HYCLATE 100 MG PO TABS
100.0000 mg | ORAL_TABLET | Freq: Two times a day (BID) | ORAL | Status: DC
  Administered 2011-08-27 – 2011-08-31 (×8): 100 mg via ORAL
  Filled 2011-08-27 (×9): qty 1

## 2011-08-27 MED ORDER — ALBUTEROL SULFATE HFA 108 (90 BASE) MCG/ACT IN AERS
2.0000 | INHALATION_SPRAY | RESPIRATORY_TRACT | Status: DC | PRN
  Administered 2011-08-27: 2 via RESPIRATORY_TRACT
  Filled 2011-08-27: qty 6.7

## 2011-08-27 MED ORDER — ONDANSETRON HCL 4 MG/2ML IJ SOLN: 4.0000 mg | Freq: Four times a day (QID) | INTRAMUSCULAR | Status: DC | PRN

## 2011-08-27 MED ORDER — PAROXETINE HCL 10 MG PO TABS
10.0000 mg | ORAL_TABLET | ORAL | Status: DC
  Administered 2011-08-28 – 2011-08-30 (×3): 10 mg via ORAL
  Filled 2011-08-27 (×5): qty 1

## 2011-08-27 MED ORDER — LORAZEPAM 1 MG PO TABS: 1.0000 mg | ORAL_TABLET | Freq: Two times a day (BID) | ORAL | Status: DC

## 2011-08-27 MED ORDER — NICOTINE 21 MG/24HR TD PT24
21.0000 mg | MEDICATED_PATCH | Freq: Once | TRANSDERMAL | Status: AC
  Administered 2011-08-27: 21 mg via TRANSDERMAL
  Filled 2011-08-27: qty 1

## 2011-08-27 MED ORDER — IPRATROPIUM BROMIDE 0.02 % IN SOLN
0.5000 mg | Freq: Once | RESPIRATORY_TRACT | Status: AC
  Administered 2011-08-27: 0.5 mg via RESPIRATORY_TRACT
  Filled 2011-08-27: qty 2.5

## 2011-08-27 MED ORDER — LORAZEPAM 1 MG PO TABS
2.0000 mg | ORAL_TABLET | Freq: Three times a day (TID) | ORAL | Status: DC
  Administered 2011-08-27 – 2011-08-30 (×10): 2 mg via ORAL
  Filled 2011-08-27 (×10): qty 2

## 2011-08-27 MED ORDER — LORAZEPAM 1 MG PO TABS
0.5000 mg | ORAL_TABLET | Freq: Once | ORAL | Status: AC
  Administered 2011-08-27: 0.5 mg via ORAL
  Filled 2011-08-27: qty 1

## 2011-08-27 MED ORDER — ACETAMINOPHEN 325 MG PO TABS
650.0000 mg | ORAL_TABLET | ORAL | Status: DC | PRN
  Administered 2011-08-30: 650 mg via ORAL
  Filled 2011-08-27 (×5): qty 2

## 2011-08-27 MED ORDER — ALBUTEROL SULFATE (5 MG/ML) 0.5% IN NEBU
2.5000 mg | INHALATION_SOLUTION | RESPIRATORY_TRACT | Status: AC
  Administered 2011-08-27 – 2011-08-28 (×3): 2.5 mg via RESPIRATORY_TRACT
  Filled 2011-08-27: qty 1
  Filled 2011-08-27 (×2): qty 0.5

## 2011-08-27 MED ORDER — METHYLPREDNISOLONE SODIUM SUCC 125 MG IJ SOLR
60.0000 mg | Freq: Two times a day (BID) | INTRAMUSCULAR | Status: DC
  Administered 2011-08-27: 60 mg via INTRAVENOUS
  Filled 2011-08-27: qty 0.96
  Filled 2011-08-27: qty 2
  Filled 2011-08-27: qty 0.96

## 2011-08-27 MED ORDER — LORAZEPAM 1 MG PO TABS
ORAL_TABLET | ORAL | Status: AC
  Filled 2011-08-27: qty 2

## 2011-08-27 MED ORDER — SODIUM CHLORIDE 0.9 % IJ SOLN
3.0000 mL | INTRAMUSCULAR | Status: DC | PRN
  Administered 2011-08-28: 3 mL via INTRAVENOUS

## 2011-08-27 MED ORDER — SODIUM CHLORIDE 0.9 % IJ SOLN
3.0000 mL | Freq: Two times a day (BID) | INTRAMUSCULAR | Status: DC
  Administered 2011-08-27 – 2011-08-30 (×6): 3 mL via INTRAVENOUS

## 2011-08-27 MED ORDER — ALBUTEROL SULFATE (5 MG/ML) 0.5% IN NEBU
5.0000 mg | INHALATION_SOLUTION | Freq: Once | RESPIRATORY_TRACT | Status: AC
  Administered 2011-08-27: 5 mg via RESPIRATORY_TRACT
  Filled 2011-08-27: qty 1

## 2011-08-27 MED ORDER — ENOXAPARIN SODIUM 30 MG/0.3ML ~~LOC~~ SOLN
30.0000 mg | SUBCUTANEOUS | Status: DC
  Administered 2011-08-27 – 2011-08-30 (×4): 30 mg via SUBCUTANEOUS
  Filled 2011-08-27 (×5): qty 0.3

## 2011-08-27 NOTE — ED Notes (Signed)
Report received. Awaiting pt to return to ED from vascular.

## 2011-08-27 NOTE — ED Notes (Signed)
Pt complaining of feeling anxious. States he usually takes ativan at home 3xday. Paged Dr. Baltazar Apo for order. Awaiting return call.

## 2011-08-27 NOTE — ED Provider Notes (Signed)
This 77 year old male lives at home with his son, and the patient states he has been coughing and wheezing for the last 2 weeks, the patient also has accidentally tripped and fallen twice in the last 2 months according to the patient without lightheadedness dizziness localized or even generalized weakness.  The patient has mild diffuse wheezing on examination.  Hurman Horn, MD 08/27/11 [phone]

## 2011-08-27 NOTE — ED Provider Notes (Signed)
Medical screening examination/treatment/procedure(s) were conducted as a shared visit with non-physician practitioner(s) and myself.  I personally evaluated the patient during the encounter  Hurman Horn, MD 08/27/11 1747

## 2011-08-27 NOTE — ED Provider Notes (Signed)
History     CSN: [id number] Arrival date & time: 08/27/2011  5:18 AM   First MD Initiated Contact with Patient 08/27/11 [phone]      Chief Complaint  Patient presents with  . Fall    (Consider location/radiation/quality/duration/timing/severity/associated sxs/prior treatment) HPI Comments: Patient with history of COPD from home. History from patient as well as EMS who spoke with the patient's son. The patient lives at home with his son. Over the past several weeks patient has had multiple falls. He has had no workup for this prior to today. This morning the patient's woke up and stumbled over a chair. Patient states that he struck the back of his head when he fell. He denies palpitations or dizziness prior to falling. He denies loss of consciousness, vomiting, weakness in extremities. The patient has no other complaints today. The patient has a history of coronary artery disease and stent placement however does not endorse any chest pain. Patient has some shortness of breath but this is at baseline per him. He occasionally uses oxygen at home and uses his inhaler several times a day.  Patient is a 77 y.o. male presenting with fall. The history is provided by the patient and the EMS personnel.  Fall The accident occurred 1 to 2 hours ago. The fall occurred while walking. The point of impact was the head. The pain is at a severity of 0/10. The patient is experiencing no pain. He was ambulatory at the scene. Pertinent negatives include no visual change, no fever, no numbness, no abdominal pain, no nausea, no vomiting, no headaches, no loss of consciousness and no tingling. He has tried nothing for the symptoms.    Past Medical History  Diagnosis Date  . Back pain   . HTN (hypertension)   . CAD (coronary artery disease)   . COPD (chronic obstructive pulmonary disease)     Stent to RCA 2004  . GERD (gastroesophageal reflux disease)   . Headache     Past Surgical History  Procedure Date  .  Coronary stent placement     x4    Family History  Problem Relation Age of Onset  . Throat cancer Father     Smoker  . Heart disease Brother   . Asthma Son   . Heart disease Brother     History  Substance Use Topics  . Smoking status: Current Some Day Smoker -- 1.0 packs/day for 50 years    Types: Cigarettes  . Smokeless tobacco: Not on file  . Alcohol Use: Yes      Review of Systems  Constitutional: Negative for fever.  Gastrointestinal: Negative for nausea, vomiting and abdominal pain.  Neurological: Negative for tingling, loss of consciousness, numbness and headaches.    Allergies  Statins  Home Medications   Current Outpatient Rx  Name Route Sig Dispense Refill  . ALBUTEROL SULFATE HFA 108 (90 BASE) MCG/ACT IN AERS Inhalation Inhale 2 puffs into the lungs every 6 (six) hours as needed. For breathing    . AMLODIPINE BESYLATE 10 MG PO TABS Oral Take 10 mg by mouth daily.      . ASPIRIN 81 MG PO CHEW Oral Chew 81 mg by mouth daily.      Marland Kitchen METOPROLOL SUCCINATE ER 25 MG PO TB24 Oral Take 25 mg by mouth daily.      Marland Kitchen PAROXETINE HCL 10 MG PO TABS Oral Take 10 mg by mouth every morning.      Marland Kitchen VALSARTAN 320 MG PO TABS  Oral Take 320 mg by mouth daily.        BP 124/74  Pulse 97  Temp(Src) 98.6 F (37 C) (Oral)  Resp 25  SpO2 97%  Physical Exam  Nursing note and vitals reviewed. Constitutional: He is oriented to person, place, and time. He appears well-developed and well-nourished.  HENT:  Head: Normocephalic and atraumatic.  Mouth/Throat: Oropharynx is clear and moist.  Eyes: Conjunctivae are normal. Pupils are equal, round, and reactive to light. Right eye exhibits no discharge. Left eye exhibits no discharge.  Neck: Normal range of motion. Neck supple.  Cardiovascular: Normal rate, regular rhythm and normal heart sounds.   Pulmonary/Chest: No respiratory distress. He has wheezes. He has no rales.       Mild diffuse expiratory wheezing  Abdominal: Soft. Bowel  sounds are normal. There is no tenderness. There is no rebound and no guarding.  Musculoskeletal: He exhibits no edema and no tenderness.       Small abrasion of right knee. Scattered varicosities across bilateral lower extremities.  Neurological: He is alert and oriented to person, place, and time.  Skin: Skin is warm and dry. No erythema.  Psychiatric: He has a normal mood and affect.    ED Course  Procedures (including critical care time)  Labs Reviewed  CBC - Abnormal; Notable for the following:    Platelets 113 (*) PLATELET COUNT CONFIRMED BY SMEAR   All other components within normal limits  BASIC METABOLIC PANEL - Abnormal; Notable for the following:    Glucose, Bld 116 (*)    Creatinine, Ser 1.66 (*)    GFR calc non Af Amer 38 (*)    GFR calc Af Amer 45 (*)    All other components within normal limits  DIFFERENTIAL  URINALYSIS, ROUTINE W REFLEX MICROSCOPIC  PROTIME-INR  APTT  CK TOTAL AND CKMB  TROPONIN I  HEPATIC FUNCTION PANEL   Dg Chest 2 View  08/27/2011  *RADIOLOGY REPORT*  Clinical Data: Syncope, shortness of breath.  COPD.  CHEST - 2 VIEW  Comparison: 08/27/2011  Findings: Minimal bibasilar atelectasis.  Heart is normal size.  No effusions or acute bony abnormality.  IMPRESSION: No active disease.  Original Report Authenticated By: Cyndie Chime, M.D.   Ct Head Wo Contrast  08/27/2011  *RADIOLOGY REPORT*  Clinical Data: Fall, hit head.  CT HEAD WITHOUT CONTRAST  Technique:  Contiguous axial images were obtained from the base of the skull through the vertex without contrast.  Comparison: 02/14/2011  Findings: Acute to subacute infarct is noted in the posterior left parietal lobe. There is atrophy and chronic small vessel disease changes.  No hemorrhage.  No hydrocephalus. No acute calvarial abnormality.  Diffuse chronic mucosal thickening throughout the paranasal sinuses.  Mastoids are clear.  IMPRESSION: Acute to subacute left posterior parietal infarct.  Atrophy,  chronic small vessel disease.  Original Report Authenticated By: Cyndie Chime, M.D.     1. CVA (cerebral infarction)   2. COPD (chronic obstructive pulmonary disease)     6:35 AM patient seen and examined. Discussed with Dr. Fonnie Jarvis. Workup ordered. Oxygen was stopped in the room and patient held his O2 sat between 92-94%.   8:07 AM CT shows acute to subacute stroke. Remainder of workup ordered. Will admit to hospital.  8:51 AM outpatient clinics to admit patient. I've spoken with the son who states the patient has been more confused and has fallen twice since Wednesday night. He has had needed help getting up off the  floor. Patient has had some slight weakness of right arm according to the son since this time.   Date: 08/27/2011  Rate: 89  Rhythm: normal sinus rhythm  QRS Axis: normal  Intervals: normal  ST/T Wave abnormalities: nonspecific ST changes  Conduction Disutrbances:nonspecific intraventricular conduction delay  Narrative Interpretation:   Old EKG Reviewed: changes noted t-waves now upright inferiorly, new <69mm ST seg depression laterally     MDM  Admit for CVA        Carolee Rota, [place] 08/27/11 [phone]

## 2011-08-27 NOTE — Progress Notes (Signed)
Bilateral carotid artery duplex completed at 11:25.  Preliminary report is no evidence of significant ICA stenosis.  Right vertebral artery has retrograde flow and left vertebral has antegrade.  Brachial blood pressure is 116 mmHg on the right and 146 mmHg on the left, suggestive of a right subclavian steal. Smiley Houseman 08/27/2011, 12:04 PM

## 2011-08-27 NOTE — H&P (Signed)
Date: 08/27/2011                  Patient Name:  Terry Flynn  MRN: [id number]  DOB: Aug 26, 1933  Age / Sex: 77 y.o., male   PCP: No primary provider on file.                 Medical Service: Internal Medicine Teaching Service                 Attending Physician: Staci Righter, MD     First Contact: Dr. Dede Query  Pager: [phone]  Second Contact: Dr. Arvilla Market  Pager: [phone]              After Hours (After 5pm / weekends / holidays): First Contact  Pager: [phone]   Second Contact  Pager: [phone]     Chief Complaint: Falls and confusion  History of Present Illness: Patient is a 77 y.o. male with a PMHx of hypertension, hyperlipidemia, coronary artery disease status post stenting, COPD, GERD, DJD lumbar spine, depression and anxiety who presents to [facility] for evaluation of falls and confusion. History was obtained from both patient and his son. Patient states that he has had a number of falls recently with the last 2 falls within this week. Patient's son states at patient first fell on Tuesday evening. He subsequently fell again morning of admission. Patient states that he is lightheaded upon standing. At baseline he is a cane to ambulate. However after his first fall patient used a walker. Per patient he reports difficulty with balance over the last year. Additionally patient has history of COPD and is only on albuterol inhaler. He has productive cough with white sputum along with worsening shortness of breath and wheezing over the last few days. No sick contacts at home. No fevers or chills. Patient also complains of a headache. He states that this headache has been chronic over the last month. It is located over the frontal region. It is not relieved by Advil. No headache symptoms present at this time.  History of obtained from son - patient has suffered from significant depression anxiety since the death of his wife in 44. Patient's son reports the patient is increasingly agitated over  the last year. He also reports increasing confusion over the last year. Patient often gets days of the week mixed up. His balance has been increasingly unstable and despite being advised to use a walker patient will only use his cane. Patient is also requiring more assistance with activities of daily living such as bathing as patient will be short of breath due to his COPD.  Current Outpatient Medications: 1.) albuterol (PROAIR HFA) 108 (90 BASE) MCG/ACT inhaler, Inhale 2 puffs into the lungs every 6 (six) hours as needed.  2.) amLODipine (NORVASC) 10 MG tablet, Take 10 mg by mouth daily.  3.) aspirin 81 MG chewable tablet, Chew 81 mg by mouth daily.  4.) metoprolol succinate (TOPROL-XL) 25 MG 24 hr tablet, Take 25 mg by mouth daily.  5.) paroxetine (PAXIL) 10 MG tablet, Take 10 mg by mouth every morning.  6.) valsartan (DIOVAN) 320 MG tablet, Take 320 mg by mouth daily.  7.) ativan 2 mg po tid   Allergies: Allergies  Allergen Reactions  . Statins Other (See Comments)    REACTION: MYALGIA     Past Medical History: Past Medical History  Diagnosis Date  . Back pain     DJD  . HTN (hypertension)   .  CAD (coronary artery disease)     stent to RCA 18-Feb-1996 and 18-Feb-2003  . COPD (chronic obstructive pulmonary disease)   . GERD (gastroesophageal reflux disease)   . Headache     chronic, worse over last month  . Chronic renal insufficiency     baseline Cr around 1.4  . Depression 1996    since wife passed away  . Anxiety 1996    since wife passed away  . Confusion 02/17/10    progressive since 02-17-10, question of dementia  . HLD (hyperlipidemia)     Past Surgical History: Past Surgical History  Procedure Date  . Coronary stent placement 1997    RCA  . Coronary stent placement 18-Feb-2003    in-stent restenosis, stent replaced RCA.      Family History: Family History  Problem Relation Age of Onset  . Throat cancer Father     Smoker  . Heart disease Brother   . Asthma Son   . Heart disease  Brother     Social History: History   Social History  . Marital Status: Widowed    Spouse Name: N/A    Number of Children: 1  . Years of Education: N/A   Occupational History  . Retired [employer]   .     Social History Main Topics  . Smoking status: Current Some Day Smoker -- 1.0 packs/day for 50 years    Types: Cigarettes  . Smokeless tobacco: Not on file  . Alcohol Use: Yes     very occasional  . Drug Use: No  . Sexually Active: Not on file   Other Topics Concern  . Not on file   Social History Narrative   Lives in [place] with his son.Widowed since 02/18/1995.ADLs: toilet-ing independently, dresses himself, administers medications independently, some meal preparation, hygiene, ambulates with cane IADLs: most activities assisted by son, ie finances, groceries, driving     Review of Systems: As per HPI  Vital Signs: Blood pressure 115/71, pulse 96, temperature 98.6 F (37 C), temperature source Oral, resp. rate 25, SpO2 96.00%.  Physical Exam: Gen: Sitting up in bed in no acute distress. Mildly tachypneic. Alert HEENT: Normocephalic, atraumatic. Right facial droop Neck: Supple, normal range of motion and no JVD, no carotid bruits Lungs-upper airway sounds, coarse breath sounds, bilateral expiratory wheezing, shallow breaths, not using accessory muscles Heart-S1-S2 present, ectopic beats, tachycardic Abdomen-Slightly distended but consistent with patient's baseline, nontender, bowel sounds are present Extremities-no lower extremity edema, pulses dorsalis pedis palpable 2+ Neuro-alert and oriented x3, cranial nerves II through XII grossly intact, strength in upper and lower extremities equal 5 out of 5, finger to nose normal however abnormal rapid rotating head movement, right facial droop present, slightly slurred speech unable to assess gait  Lab results: Basic Metabolic Panel: Recent Labs  [facility] 08/27/11 0645   Kishawn Pickar 137   K 4.0   CL 103   CO2 22    GLUCOSE 116*   BUN 23   CREATININE 1.66*   CALCIUM 9.0   MG --   PHOS --   Liver Function Tests: Recent Labs  [facility] 08/27/11 0815   AST 41*   ALT 16   ALKPHOS 78   BILITOT 0.3   PROT 8.0   ALBUMIN 3.5   CBC: Recent Labs  [facility] 08/27/11 0645   WBC 7.7   NEUTROABS 5.5   HGB 16.3   HCT 48.4   MCV 92.7   PLT 113*   Cardiac Enzymes: Recent Labs  [facility] 08/27/11 0818   CKTOTAL 2152*   CKMB 5.2*   CKMBINDEX --   TROPONINI <0.30     Imaging results:  Dg Chest 2 View  08/27/2011  *RADIOLOGY REPORT*  Clinical Data: Syncope, shortness of breath.  COPD.  CHEST - 2 VIEW  Comparison: 08/27/2011  Findings: Minimal bibasilar atelectasis.  Heart is normal size.  No effusions or acute bony abnormality.  IMPRESSION: No active disease.  Original Report Authenticated By: Cyndie Chime, M.D.   Ct Head Wo Contrast  08/27/2011  *RADIOLOGY REPORT*  Clinical Data: Fall, hit head.  CT HEAD WITHOUT CONTRAST  Technique:  Contiguous axial images were obtained from the base of the skull through the vertex without contrast.  Comparison: 02/14/2011  Findings: Acute to subacute infarct is noted in the posterior left parietal lobe. There is atrophy and chronic small vessel disease changes.  No hemorrhage.  No hydrocephalus. No acute calvarial abnormality.  Diffuse chronic mucosal thickening throughout the paranasal sinuses.  Mastoids are clear.  IMPRESSION: Acute to subacute left posterior parietal infarct.  Atrophy, chronic small vessel disease.  Original Report Authenticated By: Cyndie Chime, M.D.      Assessment & Plan:  #1 Falls and confusion-multifactorial etiology with acute CVA and dementia. Patient has suffered an acute to subacute left posterior parietal infarction.    A) CVA - right facial droop present. CT positive for a left posterior parietal infarct.         Plan: Stroke workup per protocol.    - MRI/MRA   - Lipid panel and A1c to assess risk factors   - Because patient  was compliant with aspirin this is considered as aspirin failure thus start patient on Plavix   - PT/OT evaluation   - Consider inpatient rehabilitation versus skilled nursing home facility placement if patient qualifies   - Hold blood pressure medication setting of acute stroke and soft systolic blood pressures   - Resume diet as patient passed a swallow evaluation            B) Confusion- diagnosis of dementia has not been formally made however this may be likely. Given progression over the                last year this may be consistent with Alzheimer's dementia. No prior diagnostic testing has been done for this problem.               This will likely need further workup as an outpatient with Mini-Mental Status Examination. We'll deferred to patient's                primary care provider to initiate medication such as Aricept to slow down the progression if patient indeed does have                Dementia.  #2 COPD exacerbation-patient is only on a rescue inhaler and does not have any maintenance inhalers. He is also not on home oxygen. Given onset of cough and worsening shortness of breath with wheezing patient is suffering an acute COPD exacerbation.  Plan:  -Scheduled DuoNeb breathing treatments -Solu-Medrol twice a day -Start doxycycline -Start patient on Spiriva along with albuterol after symptoms start to improve -If symptoms have improved have nursing ambulate patient and check oxygen saturations to see if patient will qualify for home O2.    #3 depression and anxiety-patient has suffered from depression and anxiety since 1996 after his wife passed. Patient has been on chronic Paxil  and Ativan since this time. Per patient's son patient has been increasingly agitated which worsens during hospitalization.   Plan: -Continue Paxil at home dose. -Continue Ativan. - consider chemical restraints such as Haldol for worsening agitation    #4 tobacco abuse-patient does not want to quit  smoking at this time. We'll provide patient with a nicotine patch.  DVT Prophylaxis: Lovenox   Code status: DNR/DNI       SIDHU,AMANJOT 08/27/2011, 12:27 PM  ----------------------------------- Addendum By Dr. Dede Query.        For confusion evaluation, We will also check TSH and Vitamin B 12 level.       We will initiate the anticoagulation therapy with Plavix for patient's CVA. Given his extensive cardiac             history with previous stents placement, Aspirin 81 mg is also initiated.

## 2011-08-27 NOTE — ED Notes (Signed)
Patient fell when he got up this morning. Pt states he stumbled over a chair. Pt's son states he has had several falls over the last few weeks

## 2011-08-27 NOTE — ED Notes (Signed)
Ordered diet tray for pt  

## 2011-08-27 NOTE — ED Provider Notes (Signed)
Received call from Dr. Kearney Hard regarding ct result of acute to subacute left parietal stroke.  Discussed with Dr. Fonnie Jarvis.  Plan admission.  Hilario Quarry, MD 08/27/11 0800

## 2011-08-28 ENCOUNTER — Encounter (HOSPITAL_COMMUNITY): Payer: Self-pay | Admitting: *Deleted

## 2011-08-28 DIAGNOSIS — J441 Chronic obstructive pulmonary disease with (acute) exacerbation: Secondary | ICD-10-CM

## 2011-08-28 DIAGNOSIS — I2510 Atherosclerotic heart disease of native coronary artery without angina pectoris: Secondary | ICD-10-CM

## 2011-08-28 DIAGNOSIS — I6350 Cerebral infarction due to unspecified occlusion or stenosis of unspecified cerebral artery: Principal | ICD-10-CM

## 2011-08-28 LAB — LIPID PANEL
HDL: 33 mg/dL — ABNORMAL LOW (ref >39)
LDL Cholesterol: 74 mg/dL (ref 0–99)
Total CHOL/HDL Ratio: 3.9 RATIO
VLDL: 21 mg/dL (ref 0–40)

## 2011-08-28 LAB — URINALYSIS, ROUTINE W REFLEX MICROSCOPIC
Glucose, UA: NEGATIVE mg/dL
Nitrite: NEGATIVE
Protein, ur: 100 mg/dL — AB

## 2011-08-28 LAB — RPR: RPR Ser Ql: NONREACTIVE

## 2011-08-28 LAB — URINE MICROSCOPIC-ADD ON

## 2011-08-28 LAB — TSH: TSH: 2.493 u[IU]/mL (ref 0.350–4.500)

## 2011-08-28 MED ORDER — IPRATROPIUM BROMIDE 0.02 % IN SOLN: 0.5000 mg | RESPIRATORY_TRACT | Status: DC | PRN

## 2011-08-28 MED ORDER — PREDNISONE 20 MG PO TABS
40.0000 mg | ORAL_TABLET | Freq: Every day | ORAL | Status: DC
  Administered 2011-08-28 – 2011-08-30 (×3): 40 mg via ORAL
  Filled 2011-08-28 (×7): qty 2

## 2011-08-28 MED ORDER — ALBUTEROL SULFATE (5 MG/ML) 0.5% IN NEBU
2.5000 mg | INHALATION_SOLUTION | Freq: Four times a day (QID) | RESPIRATORY_TRACT | Status: DC
  Administered 2011-08-28 – 2011-08-31 (×9): 2.5 mg via RESPIRATORY_TRACT
  Filled 2011-08-28 (×9): qty 0.5

## 2011-08-28 MED ORDER — ALBUTEROL SULFATE (5 MG/ML) 0.5% IN NEBU
2.5000 mg | INHALATION_SOLUTION | RESPIRATORY_TRACT | Status: DC | PRN
  Administered 2011-08-30: 2.5 mg via RESPIRATORY_TRACT
  Filled 2011-08-28: qty 0.5

## 2011-08-28 MED ORDER — IPRATROPIUM BROMIDE 0.02 % IN SOLN
0.5000 mg | RESPIRATORY_TRACT | Status: DC | PRN
  Administered 2011-08-30: 0.5 mg via RESPIRATORY_TRACT
  Filled 2011-08-28: qty 2.5

## 2011-08-28 MED ORDER — ALBUTEROL SULFATE (5 MG/ML) 0.5% IN NEBU: 2.5000 mg | INHALATION_SOLUTION | RESPIRATORY_TRACT | Status: DC | PRN

## 2011-08-28 MED ORDER — IPRATROPIUM BROMIDE 0.02 % IN SOLN
0.5000 mg | Freq: Four times a day (QID) | RESPIRATORY_TRACT | Status: DC
  Administered 2011-08-28 – 2011-08-31 (×9): 0.5 mg via RESPIRATORY_TRACT
  Filled 2011-08-28 (×9): qty 2.5

## 2011-08-28 MED ORDER — NICOTINE 21 MG/24HR TD PT24
21.0000 mg | MEDICATED_PATCH | Freq: Every day | TRANSDERMAL | Status: DC
  Administered 2011-08-28 – 2011-08-31 (×4): 21 mg via TRANSDERMAL
  Filled 2011-08-28 (×4): qty 1

## 2011-08-28 NOTE — Progress Notes (Signed)
Physical Therapy Evaluation Patient Details Name: Terry Flynn MRN: [id number] DOB: [birth year] Today's Date: 08/28/2011  Problem List:  Patient Active Problem List  Diagnoses  . DYSLIPIDEMIA  . TOBACCO ABUSE  . HYPERTENSION  . CORONARY ARTERY DISEASE  . VENTRICULAR TACHYCARDIA  . COPD UNSPECIFIED  . INGUINAL HERNIA  . NEPHROLITHIASIS  . BACK PAIN  . WEIGHT GAIN, ABNORMAL  . DYSPNEA  . GERD (gastroesophageal reflux disease)  . Headache  . GERD  . Hypercholesterolemia  . Frequent falls  . CVA (cerebral infarction)    Past Medical History:  Past Medical History  Diagnosis Date  . Back pain     DJD  . HTN (hypertension)   . CAD (coronary artery disease)     stent to RCA 02-10-1996 and 02-10-03  . COPD (chronic obstructive pulmonary disease)   . GERD (gastroesophageal reflux disease)   . Headache     chronic, worse over last month  . Chronic renal insufficiency     baseline Cr around 1.4  . Depression 1996    since wife passed away  . Anxiety 1996    since wife passed away  . Confusion Feb 09, 2010    progressive since 09-Feb-2010, question of dementia  . HLD (hyperlipidemia)    Past Surgical History:  Past Surgical History  Procedure Date  . Coronary stent placement 1997    RCA  . Coronary stent placement 2003-02-10    in-stent restenosis, stent replaced RCA.      PT Assessment/Plan/Recommendation PT Assessment Clinical Impression Statement: patient s/p CVA and history of frequent, recent falls.  Patient with no obvious loss of balance during evaluation, would like to do more high level testing of balance.  patient will benefit from skilled PT to further assessment balance and improve mobility to modified indepedent  PT Recommendation/Assessment: Patient will need skilled PT in the acute care venue PT Problem List: Decreased activity tolerance;Decreased balance;Decreased mobility;Cardiopulmonary status limiting activity PT Therapy Diagnosis : Generalized weakness PT Plan PT Frequency:  Min 4X/week PT Treatment/Interventions: DME instruction;Gait training;Functional mobility training;Therapeutic activities;Balance training PT Recommendation Recommendations for Other Services: OT consult Follow Up Recommendations: Home health PT;24 hour supervision/assistance Equipment Recommended: None recommended by PT PT Goals  Acute Rehab PT Goals PT Goal Formulation: With patient Time For Goal Achievement: 7 days Pt will go Supine/Side to Sit: with modified independence;with HOB 0 degrees;Independently PT Goal: Supine/Side to Sit - Progress: Progressing toward goal Pt will go Sit to Supine/Side: Independently;with HOB 0 degrees PT Goal: Sit to Supine/Side - Progress: Progressing toward goal Pt will go Sit to Stand: with modified independence PT Goal: Sit to Stand - Progress: Progressing toward goal Pt will go Stand to Sit: with modified independence PT Goal: Stand to Sit - Progress: Progressing toward goal Pt will Ambulate: >150 feet;with modified independence;with rolling walker PT Goal: Ambulate - Progress: Progressing toward goal  PT Evaluation Precautions/Restrictions  Precautions Precautions: Fall Required Braces or Orthoses: No Restrictions Weight Bearing Restrictions: No Prior Functioning  Home Living Lives With: Sheran Spine Help From: Family Type of Home: House Home Layout: One level Home Access: Stairs to enter Entrance Stairs-Rails: Can reach both;Right;Left Entrance Stairs-Number of Steps: 4 Home Adaptive Equipment: Straight cane;Walker - rolling Prior Function Level of Independence: Requires assistive device for independence;Independent with gait Cognition Cognition Arousal/Alertness: Awake/alert Overall Cognitive Status: Appears within functional limits for tasks assessed Orientation Level: Oriented to person;Oriented to place Sensation/Coordination Sensation Light Touch: Not tested Stereognosis: Not tested Hot/Cold: Not tested Proprioception: Not  tested Coordination Gross Motor Movements are Fluid and Coordinated: No Coordination and Movement Description: appears "stiff" in movements for lower extremities Extremity Assessment RLE Assessment RLE Assessment: Within Functional Limits LLE Assessment LLE Assessment: Within Functional Limits Mobility (including Balance) Bed Mobility Bed Mobility: Yes Supine to Sit: 5: Supervision;With rails;HOB elevated (Comment degrees) Supine to Sit Details (indicate cue type and reason): supervision for safety, cues for sequence Sit to Supine - Right: 5: Supervision;HOB flat Sit to Supine - Right Details (indicate cue type and reason): supervision for safety Scooting to [facility]: 1: +2 Total assist Scooting to [facility] Details (indicate cue type and reason): unable to scoot self in bed; two person assist to scoot to [facility] Transfers Transfers: Yes Sit to Stand: 5: Supervision;From bed;With upper extremity assist Sit to Stand Details (indicate cue type and reason): supervision for safety, balance Stand to Sit: 5: Supervision;To bed;With upper extremity assist Stand to Sit Details: supervision for safety Ambulation/Gait Ambulation/Gait: Yes Ambulation/Gait Assistance: 4: Min assist Ambulation/Gait Assistance Details (indicate cue type and reason): assist for safety, balance.  no obvious loss of balance during gait.  Patient SOB post ambulation. Ambulation Distance (Feet): 150 Feet Assistive device: Rolling walker Gait Pattern: Decreased stride length Gait velocity: decreased Stairs: No Wheelchair Mobility Wheelchair Mobility: No  Static Sitting Balance Static Sitting - Balance Support: No upper extremity supported;Feet supported Static Sitting - Level of Assistance: 5: Stand by assistance Static Sitting - Comment/# of Minutes: at least 1 minute Static Standing Balance Static Standing - Balance Support: Bilateral upper extremity supported Static Standing - Level of Assistance: 4: Min assist Static  Standing - Comment/# of Minutes: required assist for static standing Dynamic Standing Balance Dynamic Standing - Comments: no loss of balance during ambulation; had patient look up and down and right and left during ambulation. Exercise    End of Session PT - End of Session Equipment Utilized During Treatment: Gait belt Activity Tolerance: Patient tolerated treatment well Patient left: in bed;with call bell in reach Nurse Communication: Mobility status for transfers;Mobility status for ambulation General Behavior During Session: [facility] for tasks performed Cognition: Impaired (patient stated he was having surgery for stroke)  Olivia Canter, PT 08/28/2011, 12:32 PM [phone]

## 2011-08-28 NOTE — Progress Notes (Signed)
Internal Medicine MS4 Progress Note  Subjective: Terry Flynn was seen and examined bedside this morning. He states that he slept well throughout the night however as per RN staff overnight he was agitated and refused his scheduled ativan dose. He states that his breathing has symptomatically improved but still endorses mild SOB, cough, and sputum production of which he says is yellow. He denies any fevers, chills, headaches, and vision changes.  Objective: Vital signs in last 24 hours: Filed Vitals:   08/28/11 0046 08/28/11 0200 08/28/11 0506 08/28/11 1026  BP: 142/86 148/80  120/72  Pulse: 94 96  84  Temp: 99.1 F (37.3 C) 99 F (37.2 C)  97.4 F (36.3 C)  TempSrc: Oral Oral  Oral  Resp: 22 20  20   Height:      Weight:      SpO2: 94% 98% 96% 95%   Weight change:   Intake/Output Summary (Last 24 hours) at 08/28/11 1151 Last data filed at 08/27/11 1841  Gross per 24 hour  Intake      0 ml  Output    100 ml  Net   -100 ml   Physical Exam: General appearance: cooperative, no distress with slowed mentation HEENT: Normocephalic, without obvious abnormality, nasal canula in place Lungs: diminished breath sounds bilaterally with diffuse expiratory wheezes and prolonged expiratory phase Heart: S1, S2 normal, RRR Abdomen: soft, non-tender, mildly distended; bowel sounds normal; Extremities: extremities normal, atraumatic, no cyanosis or edema Pulses: 2+ and symmetric Neurologic: AAOx3, CN VII: Right sided mild facial droop prominent with attempted smile and Right eyelid close strength diminished to left; remaining CN grossly intact; Motor: 5/5 equal strength in upper and lower extremities  Lab Results: Basic Metabolic Panel:  Lab 08/27/11 0454  Tyshea Imel 137  K 4.0  CL 103  CO2 22  GLUCOSE 116*  BUN 23  CREATININE 1.66*  CALCIUM 9.0  MG --  PHOS --   Liver Function Tests:  Lab 08/27/11 1918 08/27/11 0815  AST 35 41*  ALT 15 16  ALKPHOS 67 78  BILITOT 0.2* 0.3  PROT 7.0  8.0  ALBUMIN 3.2* 3.5   CBC:  Lab 08/27/11 0645  WBC 7.7  NEUTROABS 5.5  HGB 16.3  HCT 48.4  MCV 92.7  PLT 113*   Cardiac Enzymes:  Lab 08/27/11 0818  CKTOTAL 2152*  CKMB 5.2*  CKMBINDEX --  TROPONINI <0.30   Fasting Lipid Panel:  Lab 08/28/11 0600  CHOL 128  HDL 33*  LDLCALC 74  TRIG 098  CHOLHDL 3.9  LDLDIRECT --   Thyroid Function Tests:  Lab 08/27/11 1918  TSH 2.493  T4TOTAL --  FREET4 --  T3FREE --  THYROIDAB --   Coagulation:  Lab 08/27/11 0815  LABPROT 13.2  INR 0.98   Anemia Panel:  Lab 08/27/11 1918  VITAMINB12 232  FOLATE --  FERRITIN --  TIBC --  IRON --  RETICCTPCT --    Studies/Results: Dg Chest 2 View  08/27/2011  *RADIOLOGY REPORT*  Clinical Data: Syncope, shortness of breath.  COPD.  CHEST - 2 VIEW  Comparison: 08/27/2011  Findings: Minimal bibasilar atelectasis.  Heart is normal size.  No effusions or acute bony abnormality.  IMPRESSION: No active disease.  Original Report Authenticated By: Cyndie Chime, M.D.   Ct Head Wo Contrast  08/27/2011  *RADIOLOGY REPORT*  Clinical Data: Fall, hit head.  CT HEAD WITHOUT CONTRAST  Technique:  Contiguous axial images were obtained from the base of the skull through the vertex  without contrast.  Comparison: 02/14/2011  Findings: Acute to subacute infarct is noted in the posterior left parietal lobe. There is atrophy and chronic small vessel disease changes.  No hemorrhage.  No hydrocephalus. No acute calvarial abnormality.  Diffuse chronic mucosal thickening throughout the paranasal sinuses.  Mastoids are clear.  IMPRESSION: Acute to subacute left posterior parietal infarct.  Atrophy, chronic small vessel disease.  Original Report Authenticated By: Cyndie Chime, M.D.    MRI and MRA Head/brain Wo Cm  08/27/2011  *RADIOLOGY REPORT*  Clinical Data:  Stroke.  Patient was on the floor for several hours.  Weakness.  Abnormal CT scan.  MRI HEAD WITHOUT CONTRAST MRA HEAD WITHOUT CONTRAST  Technique:   Multiplanar, multiecho pulse sequences of the brain and surrounding structures were obtained without intravenous contrast. Angiographic images of the head were obtained using MRA technique without contrast.  Comparison:  CT head without contrast 08/27/2011.  MRI HEAD  Findings:  There is increased diffusion signal, T2, and FLAIR signal within the left parietal lobe, corresponding to the area of hypoattenuation on the CT scan.  The ADC map shows intermediate to increased signal, suggesting this is an acute or subacute infarct. There is no associated hemorrhage.  Advanced generalized atrophy is present.  A moderate periventricular and mild subcortical T2 and FLAIR hyperintensity is present bilaterally.  There is asymmetric white matter disease in the brain stem.  Punctate lacunar infarcts are evident within the right cerebellum.  Flow is present in the major intracranial arteries.  The globes and orbits are intact.  Mild mucosal thickening is present in the frontal sinuses bilaterally.  There is scattered opacification of ethmoid air cells.  Circumferential mucosal thickening is evident within the maxillary sphenoid sinuses bilaterally.  There is a polyp or mucous retention cyst posteriorly in the right maxillary sinus.  IMPRESSION:  1.  Signal abnormality within the left parietal lobe is compatible with a an acute to subacute infarct, likely at least several days old. 2.  Advanced atrophy and moderate white matter disease likely reflects the sequelae of chronic microvascular ischemia. 3.  Moderate diffuse sinus disease.   MRA HEAD  Findings: The internal carotid arteries within normal limits from high cervical segments through the ICA termini.  The A1 and M1 segments are normal.  No definite to anterior communicating artery is seen.  There is moderate attenuation of MCA branch vessels bilaterally.  No significant proximal stenosis or occlusion is evident.  The left vertebral artery is the dominant vessel.  The  right vertebral artery is hypoplastic on the T2 sequence, but pierced of flow.  There is no flow signal within this artery on the MRA.  This suggests extremely slow or occluded flow.  The basilar artery is within normal limits.  There is marked attenuation of PCA branch vessels bilaterally.  IMPRESSION:  1.  Occluded or slow flow within the right vertebral artery. 2.  Moderate small vessel disease.  Original Report Authenticated By: Jamesetta Orleans. MATTERN, M.D.   Medications: I have reviewed the patient's current medications. Scheduled Meds:    . acetaminophen  650 mg Oral Q6H WA  . ipratropium  0.5 mg Nebulization Q4H   And  . albuterol  2.5 mg Nebulization Q4H  . albuterol  2.5 mg Nebulization Q6H  . clopidogrel  75 mg Oral Q breakfast  . doxycycline  100 mg Oral Q12H  . enoxaparin  30 mg Subcutaneous Q24H  . ipratropium  0.5 mg Nebulization Q6H  . LORazepam      .  LORazepam  2 mg Oral Q8H  . nicotine  21 mg Transdermal Once  . PARoxetine  10 mg Oral Q0700  . predniSONE  40 mg Oral Q breakfast  . sodium chloride  3 mL Intravenous Q12H  . DISCONTD: aspirin  81 mg Oral Daily  . DISCONTD: LORazepam  1 mg Oral BID  . DISCONTD: methylPREDNISolone (SOLU-MEDROL) injection  60 mg Intravenous Q12H   Continuous Infusions:  PRN Meds:.acetaminophen, albuterol, guaiFENesin, ipratropium, ondansetron (ZOFRAN) IV, senna-docusate, sodium chloride, DISCONTD: albuterol, DISCONTD: albuterol, DISCONTD: ipratropium  Assessment & Plan by Problem Terry Flynn is a 77 year old man with a past medical history significant for COPD, CAD s/p RCA stent, hyperlipidemia, depression, and anxiety, presenting with falls and confusion who on imaging was found to have a several day old non-hemorraghic infarct within the left parietal lobe. He also presented with a several day history of worsening cough, SOB, and sputum production being managed as an acute exacerbation of his underlying COPD. Finally, he has a history of  depression and anxiety since the passing of his wife that will be managed on this admission as well.  #Falls and Confusion Patient endorses year long history of increasing confusion with numerous falls. Etiology is most likely multifactorial with an acute CVA found on imaging. An underlying component of undiagnosed dementia may be present as well and will be evaluated on this admission. An MRI and MRA were performed to further evaluate the CVA. The MRA showed occluded vs slow flow within the Right vertebral artery and the MRI showed likely several day old acute to subacute infarct with advanced atrophy and moderate white matter disease likely a sequelae of chronic microvascular ischemia. Lipid panel to assess stroke risk within normal limits. Patient was compliant on ASA 81 at home but with this presentation is considered an ASA failure and was started on clopidogrel. Antihypertensive medications were held as patient has recent stroke with pressures stable. Patient may have an underlying dementia which was worked up with a B12 and TSH which were normal and an RPR which is pending.  -holding antihypertensive medications -will obtain Hemoglobin A1C for evaluation of risk factors -will f/u RPR as part of dementia workup -continue clopidogrel -PT/OT evaluation  #COPD exacerbation Patient has a history of COPD with no home maintenance medication. He presented with a several day history of cough, sputum production, and SOB.   -DuoNeb therapy now discontinued with lung exam mildly improved -Will start albuterol nebulizer Q6 scheduled -Will start atrovent nebulizer Q6 scheduled -Will order for separate albuterol and atrovent nebulizer Q2 PRN for tighter management -switched IV solu-medrol to PO prednisone as patient passed swallow study and can tolerate PO meds -Continue doxycycline   #Depression and Anxiety This has been an ongoing issue since the death of his wife in 1995/02/18. Patient has home  medications of Ativan and Paxil which will be continued on his home scheduled regimen. If agitation not controlled with home regimen will consider adding a PRN Haldol.  -Continue Ativan 2mg  TID -Continue Paxil 10mg  daily  #Chronic Smoking History Patient has 50 pack year history of smoking and continues to smoke. Patient was counseled and is not interested in smoking cessation at this time.   -Nictotine patch during this admission  DVT prophylaxis: Lovenox  Disposition: Secondary to admission history, patient will most likely require inpatient rehabilitation vs a skilled nursing facility upon discharge. Will follow-up PT/OT recommendations.    LOS: 1 day   Joseph Berkshire 08/28/2011, 11:51 AM

## 2011-08-28 NOTE — H&P (Signed)
Internal Medicine Teaching Service Attending Note Date: 08/28/2011  Patient name: Terry Flynn [facility]  Medical record number: [id number]  Date of birth: 04-Aug-1933   I have seen and evaluated Terry Flynn and discussed their care with the Residency Team.  77 yo with hitory of COPD and falls came in with confusion and falls. He has had at least 2 falls in the last week.  Endorses some lightheadedness.  Has also had progressively worsening ability to walk over the last year.  + headache.   Physical Exam: Blood pressure 120/72, pulse 84, temperature 97.4 F (36.3 C), temperature source Oral, resp. rate 20, height 5\' 7"  (1.702 m), weight 198 lb 3.2 oz (89.903 kg), SpO2 95.00%. BP 120/72  Pulse 84  Temp(Src) 97.4 F (36.3 C) (Oral)  Resp 20  Ht 5\' 7"  (1.702 m)  Wt 198 lb 3.2 oz (89.903 kg)  BMI 31.04 kg/m2  SpO2 95% General appearance: alert and no distress Lungs: clear to auscultation bilaterally Neurologic: Grossly normal, not oriented to place or time.   Lab results: Results for orders placed during the hospital encounter of 08/27/11 (from the past 24 hour(s))  VITAMIN B12     Status: Normal   Collection Time   08/27/11  7:18 PM      Component Value Range   Vitamin B-12 232  211 - 911 (pg/mL)  HEPATIC FUNCTION PANEL     Status: Abnormal   Collection Time   08/27/11  7:18 PM      Component Value Range   Total Protein 7.0  6.0 - 8.3 (g/dL)   Albumin 3.2 (*) 3.5 - 5.2 (g/dL)   AST 35  0 - 37 (U/L)   ALT 15  0 - 53 (U/L)   Alkaline Phosphatase 67  39 - 117 (U/L)   Total Bilirubin 0.2 (*) 0.3 - 1.2 (mg/dL)   Bilirubin, Direct <4.0  0.0 - 0.3 (mg/dL)   Indirect Bilirubin NOT CALCULATED  0.3 - 0.9 (mg/dL)  TSH     Status: Normal   Collection Time   08/27/11  7:18 PM      Component Value Range   TSH 2.493  0.350 - 4.500 (uIU/mL)  LIPID PANEL     Status: Abnormal   Collection Time   08/28/11  6:00 AM      Component Value Range   Cholesterol 128  0 - 200 (mg/dL)   Triglycerides 981   <150 (mg/dL)   HDL 33 (*) >19 (mg/dL)   Total CHOL/HDL Ratio 3.9     VLDL 21  0 - 40 (mg/dL)   LDL Cholesterol 74  0 - 99 (mg/dL)    Imaging results:  Dg Chest 2 View  08/27/2011  *RADIOLOGY REPORT*  Clinical Data: Syncope, shortness of breath.  COPD.  CHEST - 2 VIEW  Comparison: 08/27/2011  Findings: Minimal bibasilar atelectasis.  Heart is normal size.  No effusions or acute bony abnormality.  IMPRESSION: No active disease.  Original Report Authenticated By: Cyndie Chime, M.D.   Ct Head Wo Contrast  08/27/2011  *RADIOLOGY REPORT*  Clinical Data: Fall, hit head.  CT HEAD WITHOUT CONTRAST  Technique:  Contiguous axial images were obtained from the base of the skull through the vertex without contrast.  Comparison: 02/14/2011  Findings: Acute to subacute infarct is noted in the posterior left parietal lobe. There is atrophy and chronic small vessel disease changes.  No hemorrhage.  No hydrocephalus. No acute calvarial abnormality.  Diffuse chronic mucosal thickening throughout the  paranasal sinuses.  Mastoids are clear.  IMPRESSION: Acute to subacute left posterior parietal infarct.  Atrophy, chronic small vessel disease.  Original Report Authenticated By: Cyndie Chime, M.D.   Mr Brain Wo Contrast  08/27/2011  *RADIOLOGY REPORT*  Clinical Data:  Stroke.  Patient was on the floor for several hours.  Weakness.  Abnormal CT scan.  MRI HEAD WITHOUT CONTRAST MRA HEAD WITHOUT CONTRAST  Technique:  Multiplanar, multiecho pulse sequences of the brain and surrounding structures were obtained without intravenous contrast. Angiographic images of the head were obtained using MRA technique without contrast.  Comparison:  CT head without contrast 08/27/2011.  MRI HEAD  Findings:  There is increased diffusion signal, T2, and FLAIR signal within the left parietal lobe, corresponding to the area of hypoattenuation on the CT scan.  The ADC map shows intermediate to increased signal, suggesting this is an acute or  subacute infarct. There is no associated hemorrhage.  Advanced generalized atrophy is present.  A moderate periventricular and mild subcortical T2 and FLAIR hyperintensity is present bilaterally.  There is asymmetric white matter disease in the brain stem.  Punctate lacunar infarcts are evident within the right cerebellum.  Flow is present in the major intracranial arteries.  The globes and orbits are intact.  Mild mucosal thickening is present in the frontal sinuses bilaterally.  There is scattered opacification of ethmoid air cells.  Circumferential mucosal thickening is evident within the maxillary sphenoid sinuses bilaterally.  There is a polyp or mucous retention cyst posteriorly in the right maxillary sinus.  IMPRESSION:  1.  Signal abnormality within the left parietal lobe is compatible with a an acute to subacute infarct, likely at least several days old. 2.  Advanced atrophy and moderate white matter disease likely reflects the sequelae of chronic microvascular ischemia. 3.  Moderate diffuse sinus disease.  MRA HEAD  Findings: The internal carotid arteries within normal limits from high cervical segments through the ICA termini.  The A1 and M1 segments are normal.  No definite to anterior communicating artery is seen.  There is moderate attenuation of MCA branch vessels bilaterally.  No significant proximal stenosis or occlusion is evident.  The left vertebral artery is the dominant vessel.  The right vertebral artery is hypoplastic on the T2 sequence, but pierced of flow.  There is no flow signal within this artery on the MRA.  This suggests extremely slow or occluded flow.  The basilar artery is within normal limits.  There is marked attenuation of PCA branch vessels bilaterally.  IMPRESSION:  1.  Occluded or slow flow within the right vertebral artery. 2.  Moderate small vessel disease.  Original Report Authenticated By: Jamesetta Orleans. MATTERN, M.D.   Mr Maxine Glenn Head/brain Wo Cm  08/27/2011  *RADIOLOGY  REPORT*  Clinical Data:  Stroke.  Patient was on the floor for several hours.  Weakness.  Abnormal CT scan.  MRI HEAD WITHOUT CONTRAST MRA HEAD WITHOUT CONTRAST  Technique:  Multiplanar, multiecho pulse sequences of the brain and surrounding structures were obtained without intravenous contrast. Angiographic images of the head were obtained using MRA technique without contrast.  Comparison:  CT head without contrast 08/27/2011.  MRI HEAD  Findings:  There is increased diffusion signal, T2, and FLAIR signal within the left parietal lobe, corresponding to the area of hypoattenuation on the CT scan.  The ADC map shows intermediate to increased signal, suggesting this is an acute or subacute infarct. There is no associated hemorrhage.  Advanced generalized atrophy is present.  A moderate periventricular and mild subcortical T2 and FLAIR hyperintensity is present bilaterally.  There is asymmetric white matter disease in the brain stem.  Punctate lacunar infarcts are evident within the right cerebellum.  Flow is present in the major intracranial arteries.  The globes and orbits are intact.  Mild mucosal thickening is present in the frontal sinuses bilaterally.  There is scattered opacification of ethmoid air cells.  Circumferential mucosal thickening is evident within the maxillary sphenoid sinuses bilaterally.  There is a polyp or mucous retention cyst posteriorly in the right maxillary sinus.  IMPRESSION:  1.  Signal abnormality within the left parietal lobe is compatible with a an acute to subacute infarct, likely at least several days old. 2.  Advanced atrophy and moderate white matter disease likely reflects the sequelae of chronic microvascular ischemia. 3.  Moderate diffuse sinus disease.  MRA HEAD  Findings: The internal carotid arteries within normal limits from high cervical segments through the ICA termini.  The A1 and M1 segments are normal.  No definite to anterior communicating artery is seen.  There is  moderate attenuation of MCA branch vessels bilaterally.  No significant proximal stenosis or occlusion is evident.  The left vertebral artery is the dominant vessel.  The right vertebral artery is hypoplastic on the T2 sequence, but pierced of flow.  There is no flow signal within this artery on the MRA.  This suggests extremely slow or occluded flow.  The basilar artery is within normal limits.  There is marked attenuation of PCA branch vessels bilaterally.  IMPRESSION:  1.  Occluded or slow flow within the right vertebral artery. 2.  Moderate small vessel disease.  Original Report Authenticated By: Jamesetta Orleans. MATTERN, M.D.    Assessment and Plan: I agree with the formulated Assessment and Plan with the following changes: He has evidence of a stroke in parietal region.  He will require secondary prevention with Plavix.  He is allergic to Statins so will have to use other methods for lipid control. His creat is likely 2/2 prerenal process and will follow, his baseline is normal.  For his CVA, he will get PT/OT and consider rehab.  I do not feel he needs permissive blood pressure control since this is not acute on MRI.

## 2011-08-28 NOTE — Progress Notes (Signed)
Patient has not voided since being changed from incontinence at 2030 last night with moderate saturation. Bladder scanned at 0430 and result shows 97 ml. Attempt made to in and out cath after writer explained what was going to be done and why but patient declined and stated,  "if don't leave my room , i will get my gun and shoot you". Writer and nurse tech left room.    Patient in agitated mood still and declined his 0600 Ativan 2 mg. Another nurse called to help but still declined.

## 2011-08-29 ENCOUNTER — Inpatient Hospital Stay (HOSPITAL_COMMUNITY): Payer: Medicare Other

## 2011-08-29 LAB — CREATININE, URINE, RANDOM: Creatinine, Urine: 139.48 mg/dL

## 2011-08-29 LAB — BASIC METABOLIC PANEL
BUN: 45 mg/dL — ABNORMAL HIGH (ref 6–23)
CO2: 24 mEq/L (ref 19–32)
Chloride: 104 mEq/L (ref 96–112)
Glucose, Bld: 140 mg/dL — ABNORMAL HIGH (ref 70–99)
Potassium: 4.3 mEq/L (ref 3.5–5.1)

## 2011-08-29 MED ORDER — TRAZODONE 25 MG HALF TABLET
25.0000 mg | ORAL_TABLET | Freq: Once | ORAL | Status: AC
  Administered 2011-08-29: 25 mg via ORAL
  Filled 2011-08-29: qty 1

## 2011-08-29 MED ORDER — WHITE PETROLATUM GEL
Status: AC
  Administered 2011-08-29: 19:00:00
  Filled 2011-08-29: qty 5

## 2011-08-29 NOTE — Progress Notes (Signed)
Subjective: Pt feeling well today.  Reports poor sleep overnight.  No other concerns/complaints.  No acute events overnight.  Objective: Vital signs in last 24 hours: Filed Vitals:   08/29/11 0307 08/29/11 0500 08/29/11 0738 08/29/11 0933  BP: 133/81 139/83  129/84  Pulse: 74 81  93  Temp: 97.4 F (36.3 C) 97.4 F (36.3 C)  97.8 F (36.6 C)  TempSrc: Axillary Axillary  Oral  Resp: 22 24  18   Height:      Weight:      SpO2: 94% 96% 94% 91%   Weight change:   Intake/Output Summary (Last 24 hours) at 08/29/11 1041 Last data filed at 08/29/11 0534  Gross per 24 hour  Intake    483 ml  Output    425 ml  Net     58 ml    Physical Exam: Gen: Sitting up in bed in no acute distress. Alert and oriented HEENT: Normocephalic, atraumatic. Right facial droop noted Neck: Supple, normal range of motion and no JVD, no carotid bruits  Lungs-CTAB with good air movement.  No ronchi, wheezes, or crackles Heart-S1-S2 present, RRR with occasional ectopy Abdomen-Slightly distended but consistent with patient's baseline, nontender, bowel sounds are present  Extremities-no lower extremity edema, pulses dorsalis pedis palpable 2+  Neuro-alert and oriented x3, cranial nerves II through XII grossly intact, strength in upper and lower extremities equal 5 out of 5,   Lab Results: Basic Metabolic Panel:  Lab 08/29/11 1610 08/27/11 0645  NA 138 137  K 4.3 4.0  CL 104 103  CO2 24 22  GLUCOSE 140* 116*  BUN 45* 23  CREATININE 2.03* 1.66*  CALCIUM 8.5 9.0  MG -- --  PHOS -- --   Medications: I have reviewed the patient's current medications.  Assessment/Plan: CVA: pt remains neurologically stable with no new changes.  TSH and A1c wnl.  LDL at goal. - continue Plavix - continue PT/OT  A/CKI: Cr trending up.  U/A reveals pyuria however pt asymptomatic (no dysuria, etc).  He reports a hx of enlarged prostate, unclear if rising Cr is the result of obstructive pathology +/- pre-renal.   - D/C  condom cath - Check urine Cr, urine Na - Renal U/S  - BMET in am  Fall and confusion: likely multifactorial and related to pts underlying dementia, acute CVA, and underlying CNS vascular disease. - check orthostatic blood pressure - continue with PT/OT  COPD exacerbation: this is improved.  Plan for prednisone taper.  He will need albuterol inhaler as well as rx for tiotropium on d/c.  Depression/anxiety: stable on current medication regimen.  Insomnia: pt reports difficulty sleeping.  May be related to depression in addition to hospitalization.  Will try trazodone tonight. - Trazodone 25mg  qhs  Dispo: PT recommends d/c to home with HHPT.  WIll need to confirm with son; if pt cannot go home with son, will need SNF.   Terry Flynn 08/29/2011, 10:41 AM

## 2011-08-29 NOTE — Progress Notes (Signed)
CSW received consult for SNF placement. PT recommendation for home with [facility] noted. Also noted pt ambulating 150 ft with no loss of balance and lives with son. If plan changes and SNF needed, will need OT eval as pt has [employer]. CSW signing off. Please re-consult if SNF needed. Thanks. Dellie Burns, MSW, [place] [phone]

## 2011-08-30 LAB — BASIC METABOLIC PANEL
CO2: 23 mEq/L (ref 19–32)
Chloride: 106 mEq/L (ref 96–112)
Glucose, Bld: 105 mg/dL — ABNORMAL HIGH (ref 70–99)
Potassium: 3.7 mEq/L (ref 3.5–5.1)
Sodium: 138 mEq/L (ref 135–145)

## 2011-08-30 MED ORDER — SODIUM CHLORIDE 0.9 % IV SOLN: 250.0000 mL | INTRAVENOUS | Status: DC | PRN

## 2011-08-30 MED ORDER — SODIUM CHLORIDE 0.9 % IJ SOLN
3.0000 mL | Freq: Two times a day (BID) | INTRAMUSCULAR | Status: DC
  Administered 2011-08-31: 3 mL via INTRAVENOUS

## 2011-08-30 MED ORDER — SODIUM CHLORIDE 0.45 % IV SOLN
INTRAVENOUS | Status: DC
  Administered 2011-08-30: 23:00:00 via INTRAVENOUS

## 2011-08-30 MED ORDER — AMLODIPINE BESYLATE 10 MG PO TABS
10.0000 mg | ORAL_TABLET | Freq: Every day | ORAL | Status: DC
  Administered 2011-08-30 – 2011-08-31 (×2): 10 mg via ORAL
  Filled 2011-08-30 (×2): qty 1

## 2011-08-30 MED ORDER — SODIUM CHLORIDE 0.9 % IV SOLN: INTRAVENOUS | Status: AC

## 2011-08-30 MED ORDER — METOPROLOL SUCCINATE ER 25 MG PO TB24
25.0000 mg | ORAL_TABLET | Freq: Every day | ORAL | Status: DC
  Administered 2011-08-30 – 2011-08-31 (×2): 25 mg via ORAL
  Filled 2011-08-30 (×2): qty 1

## 2011-08-30 MED ORDER — SODIUM CHLORIDE 0.9 % IJ SOLN: 3.0000 mL | INTRAMUSCULAR | Status: DC | PRN

## 2011-08-30 NOTE — Progress Notes (Signed)
Occupational Therapy Evaluation Patient Details Name: Terry Flynn MRN: [id number] DOB: [birth year] Today's Date: 08/30/2011  Problem List:  Patient Active Problem List  Diagnoses  . DYSLIPIDEMIA  . TOBACCO ABUSE  . HYPERTENSION  . CORONARY ARTERY DISEASE  . VENTRICULAR TACHYCARDIA  . COPD UNSPECIFIED  . INGUINAL HERNIA  . NEPHROLITHIASIS  . BACK PAIN  . WEIGHT GAIN, ABNORMAL  . DYSPNEA  . GERD (gastroesophageal reflux disease)  . Headache  . GERD  . Hypercholesterolemia  . Frequent falls  . CVA (cerebral infarction)    Past Medical History:  Past Medical History  Diagnosis Date  . Back pain     DJD  . HTN (hypertension)   . CAD (coronary artery disease)     stent to RCA February 22, 1996 and 2003-02-22  . COPD (chronic obstructive pulmonary disease)   . GERD (gastroesophageal reflux disease)   . Headache     chronic, worse over last month  . Chronic renal insufficiency     baseline Cr around 1.4  . Depression 1996    since wife passed away  . Anxiety 1996    since wife passed away  . Confusion 21-Feb-2010    progressive since 02/21/10, question of dementia  . HLD (hyperlipidemia)    Past Surgical History:  Past Surgical History  Procedure Date  . Coronary stent placement 1997    RCA  . Coronary stent placement 2003/02/22    in-stent restenosis, stent replaced RCA.      OT Assessment/Plan/Recommendation OT Assessment Clinical Impression Statement: Patient will benefit from skilled OT in the acute setting to maximize independence with ADL and ADL mobility to Mod I/I- Supervision level upon d/c home. OT Recommendation/Assessment: Patient will need skilled OT in the acute care venue OT Problem List: Decreased activity tolerance;Impaired balance (sitting and/or standing);Decreased knowledge of use of DME or AE OT Therapy Diagnosis : Generalized weakness OT Plan OT Frequency: Min 2X/week OT Treatment/Interventions: Self-care/ADL training;Energy conservation;DME and/or AE  instruction;Therapeutic activities;Patient/family education OT Recommendation Follow Up Recommendations: Home health OT Equipment Recommended: Tub/shower seat Individuals Consulted Consulted and Agree with Results and Recommendations: Patient OT Goals Acute Rehab OT Goals OT Goal Formulation: With patient Time For Goal Achievement: 7 days ADL Goals Pt Will Perform Lower Body Dressing: with modified independence;Sit to stand from bed ADL Goal: Lower Body Dressing - Progress: Other (comment) Pt Will Transfer to Toilet: with modified independence;Ambulation;3-in-1 (RW) ADL Goal: Toilet Transfer - Progress: Other (comment) Pt Will Perform Tub/Shower Transfer: with supervision;Tub transfer;Ambulation;with DME;Shower seat without back ADL Goal: Web designer - Progress: Other (comment)  OT Evaluation Precautions/Restrictions  Precautions Precautions: Fall Required Braces or Orthoses: No Restrictions Weight Bearing Restrictions: No Prior Functioning Home Living Lives With: Sheran Spine Help From: Family Type of Home: House Bathroom Shower/Tub: Nurse, adult Toilet: Standard Home Adaptive Equipment: Bedside commode/3-in-1   ADL ADL Eating/Feeding: Performed;Independent Where Assessed - Eating/Feeding: Edge of bed Grooming: Performed;Supervision/safety Where Assessed - Grooming: Standing at sink Upper Body Bathing: Simulated;Supervision/safety;Set up Where Assessed - Upper Body Bathing: Sitting, bed Lower Body Bathing: Performed;Supervision/safety Lower Body Bathing Details (indicate cue type and reason): Min guard assist for sit to stand Where Assessed - Lower Body Bathing: Sit to stand from bed Upper Body Dressing: Simulated;Supervision/safety (Min hand held assist for item retrieval) Where Assessed - Upper Body Dressing: Sitting, bed Lower Body Dressing: Simulated;Supervision/safety (min hand held assist for item retrieval) Where Assessed - Lower Body  Dressing: Sit to stand from bed Toilet Transfer: Performed;Minimal assistance Toilet Transfer  Details (indicate cue type and reason): hand held assist for ambulation.patient with one posterior LOB and required therapist assist to correct Toilet Transfer Method: Ambulating Toilet Transfer Equipment: Raised toilet seat with arms (or 3-in-1 over toilet) Toileting - Clothing Manipulation: Performed;Minimal assistance Where Assessed - Toileting Clothing Manipulation: Standing Toileting - Hygiene: Performed;Supervision/safety Where Assessed - Toileting Hygiene: Sit on 3-in-1 or toilet Tub/Shower Transfer: Not assessed ADL Comments: Patient states he was receiving some assist from son with ADLs, specifically bathing PTA. Educated patient re: safety and DME. Recommend use of tub/shower seat for bathing. Vision/Perception  Vision - History Baseline Vision: No visual deficits Patient Visual Report: No change from baseline Vision - Assessment Eye Alignment: Within Functional Limits Cognition Cognition Arousal/Alertness: Awake/alert Overall Cognitive Status: Appears within functional limits for tasks assessed Orientation Level: Oriented X4 Sensation/Coordination Sensation Light Touch: Appears Intact Coordination Gross Motor Movements are Fluid and Coordinated: Yes Fine Motor Movements are Fluid and Coordinated: Yes Extremity Assessment RUE Assessment RUE Assessment: Within Functional Limits LUE Assessment LUE Assessment: Within Functional Limits Mobility  Bed Mobility Supine to Sit: HOB flat;7: Independent Sit to Supine - Right: 7: Independent;HOB flat Transfers Sit to Stand: With upper extremity assist;From bed (Min guard assist) Stand to Sit: 5: Supervision;To bed End of Session OT - End of Session Equipment Utilized During Treatment: Gait belt Activity Tolerance:  (Pt with SOB during session- states this is baseline) Patient left: in bed;with call bell in reach General Behavior  During Session: [facility] for tasks performed Cognition: [facility] for tasks performed   Enslee Bibbins 08/30/2011, 3:27 PM

## 2011-08-30 NOTE — Progress Notes (Signed)
Physical Therapy Treatment Patient Details Name: Terry Flynn MRN: [id number] DOB: April 15, 1933 Today's Date: 08/30/2011  PT Assessment/Plan  PT - Assessment/Plan Comments on Treatment Session: Pt Presents with decreased endurance and SOB/wheezing with activity. Pt would benefit from continued skilled PT and HHPT follow-up. Continue to recommend S with mobility. PT Plan: Discharge plan remains appropriate Follow Up Recommendations: Home health PT Equipment Recommended: None recommended by PT PT Goals  Acute Rehab PT Goals PT Goal: Supine/Side to Sit - Progress: Met PT Goal: Sit to Stand - Progress: Progressing toward goal PT Goal: Stand to Sit - Progress: Progressing toward goal PT Goal: Ambulate - Progress: Progressing toward goal  PT Treatment Precautions/Restrictions  Precautions Precautions: Fall Required Braces or Orthoses: No Restrictions Weight Bearing Restrictions: No Mobility (including Balance) Bed Mobility Bed Mobility: Yes Supine to Sit: 6: Modified independent (Device/Increase time) Transfers Transfers: Yes Sit to Stand: 5: Supervision;From bed;With upper extremity assist Sit to Stand Details (indicate cue type and reason): wide Base of support and cues to increased forward trunk flexion to increase independence Stand to Sit: 5: Supervision;With upper extremity assist;To chair/3-in-1 Stand to Sit Details: uses UE to control descent into chair Ambulation/Gait Ambulation/Gait: Yes Ambulation/Gait Assistance: 4: Min assist Ambulation/Gait Assistance Details (indicate cue type and reason): SOB and wheezing noted with gait, good velocity and stride length, but with fatigue decreases Ambulation Distance (Feet): 100 Feet (second trial 130 feet) Assistive device: None Gait Pattern: Decreased stride length;Decreased dorsiflexion - right;Right foot flat Gait velocity: decreases with fatigue  Posture/Postural Control Posture/Postural Control: No significant  limitations Balance Balance Assessed: Yes Static Sitting Balance Static Sitting - Level of Assistance: 7: Independent Static Standing Balance Static Standing - Balance Support: No upper extremity supported Static Standing - Level of Assistance: 5: Stand by assistance Static Standing - Comment/# of Minutes: wide BOS and pushes back into bed/chair with legs for stability Dynamic Standing Balance Dynamic Standing - Balance Support: No upper extremity supported Dynamic Standing - Level of Assistance: 5: Stand by assistance Dynamic Standing - Balance Activities: Forward lean/weight shifting;Reaching for objects;Reaching across midline;Lateral lean/weight shifting Dynamic Standing - Comments: reaching about 3 inches, maintains wide BOS to maintain balance Exercise    End of Session PT - End of Session Equipment Utilized During Treatment: Gait belt Activity Tolerance: Patient limited by fatigue;Other (comment) (rest breaks needed due to SOB and wheezing with gait) Patient left: in chair;with call bell in reach Nurse Communication: Mobility status for ambulation General Behavior During Session: [facility] for tasks performed Cognition: [facility] for tasks performed  Greggory Stallion 08/30/2011, 10:58 AM

## 2011-08-30 NOTE — Progress Notes (Signed)
Speech Language/Pathology Speech Language Pathology Evaluation Patient Details Name: Terry Flynn MRN: [id number] DOB: 23-Nov-1932 Today's Date: 08/30/2011  Problem List:  Patient Active Problem List  Diagnoses  . DYSLIPIDEMIA  . TOBACCO ABUSE  . HYPERTENSION  . CORONARY ARTERY DISEASE  . VENTRICULAR TACHYCARDIA  . COPD UNSPECIFIED  . INGUINAL HERNIA  . NEPHROLITHIASIS  . BACK PAIN  . WEIGHT GAIN, ABNORMAL  . DYSPNEA  . GERD (gastroesophageal reflux disease)  . Headache  . GERD  . Hypercholesterolemia  . Frequent falls  . CVA (cerebral infarction)    Past Medical History:  Past Medical History  Diagnosis Date  . Back pain     DJD  . HTN (hypertension)   . CAD (coronary artery disease)     stent to RCA 02-24-96 and 24-Feb-2003  . COPD (chronic obstructive pulmonary disease)   . GERD (gastroesophageal reflux disease)   . Headache     chronic, worse over last month  . Chronic renal insufficiency     baseline Cr around 1.4  . Depression 1996    since wife passed away  . Anxiety 1996    since wife passed away  . Confusion February 23, 2010    progressive since 2010/02/23, question of dementia  . HLD (hyperlipidemia)    Past Surgical History:  Past Surgical History  Procedure Date  . Coronary stent placement 1997    RCA  . Coronary stent placement 2003/02/24    in-stent restenosis, stent replaced RCA.      SLP Assessment/Plan/Recommendation Assessment Clinical Impression Statement: \ SLP Recommendation/Assessment: Patient will need skilled Speech Lanaguage Pathology Services in the acute care venue to address identified deficits Problem List: Memory;Problem Solving Therapy Diagnosis: Cognitive Impairments Plan Speech Therapy Frequency: min 1 x/week Duration: 2 weeks Treatment/Interventions: Environmental controls;Cueing hierarchy;Functional tasks;SLP instruction and feedback;Compensatory strategies;Patient/family education Potential to Achieve Goals: Good Potential Considerations:  Ability to learn/carryover information;Family/community support Recommendation Recommendations for Other Services: OT consult;PT consult Follow up Recommendations: Home health SLP Equipment Recommended: Tub/shower seat Individuals Consulted Consulted and Agree with Results and Recommendations: Family member/caregiver;Patient  1.SLP Goals   Please see goal section of this assessment for goals  SLP Evaluation Prior Functioning  Prior Functional Status Type of Home: House Lives With: Sheran Spine Help From: Family Cognition Cognition Overall Cognitive Status: Impaired Arousal/Alertness: Awake/alert Orientation Level: Oriented X4 Attention: Focused;Sustained Focused Attention: Appears intact Sustained Attention: Appears intact Memory: Impaired Memory Impairment: Decreased short term memory;Retrieval deficit;Decreased recall of new information Decreased Short Term Memory: Functional basic;Verbal complex Awareness: Appears intact Problem Solving: Impaired Problem Solving Impairment: Verbal basic Executive Function: Self Monitoring;Self Correcting Self Correcting: Appears intact Safety/Judgment: Impaired Comprehension  Auditory Comprehension Yes/No Questions: Not tested Commands: Within Functional Limits Conversation: Simple Interfering Components: Working memory;Processing speed EffectiveTechniques: Extra processing time;Repetition;Stressing words IT trainer Discrimination: Not tested Reading Comprehension Reading Status: Not tested Expression  Expression Primary Mode of Expression: Verbal Verbal Expression Initiation: No impairment Level of Generative/Spontaneous Verbalization: Conversation Repetition: No impairment Naming: Not tested Pragmatics: No impairment Non-Verbal Means of Communication: Not applicable Written Expression Written Expression: Not tested Oral/Motor  Oral Motor/Sensory Function Labial ROM: Reduced right Labial Symmetry:  Abnormal symmetry right Labial Strength: Reduced Lingual ROM: Reduced right Lingual Symmetry: Abnormal symmetry right Lingual Strength: Reduced Facial ROM: Reduced right Facial Symmetry: Right droop Facial Strength: Reduced Facial Sensation: Reduced Velum: Impaired right Mandible: Within Functional Limits Motor Speech Intelligibility: Intelligible  Ulice Dash, SLP student  Breck Coons Lonell Face.Ed [employer] [phone]  08/30/2011

## 2011-08-30 NOTE — Plan of Care (Signed)
Problem: Phase II Progression Outcomes Goal: Other Phase II Outcomes/Goals Completed Speech, Language, Cognitive Evaluation. Pt. will benefit from ST services in acute care. See full report

## 2011-08-30 NOTE — Progress Notes (Signed)
Internal Medicine MS4 Progress Note  Subjective: Patient was seen and examined bedside this morning. He states that he "didn't sleep well because I got to bed late". As per RN no acute overnight events and no severe agitation. He had one non-loose bowel movement this morning and reports urinating without difficulty in initiation, hesitance, and denies dysuria. He complains of mild cough and denies SOB, headache, vision changes, or chest pain.   Objective: Vital signs in last 24 hours: Filed Vitals:   08/30/11 0200 08/30/11 0700 08/30/11 0701 08/30/11 0702  BP: 122/69 132/85 150/79 152/79  Pulse: 60 73 80 85  Temp: 97.6 F (36.4 C) 98.1 F (36.7 C)    TempSrc: Oral Oral    Resp: 20 22    Height:      Weight:      SpO2: 93% 94%     Orthostatic vitals for pt. LYING: 132/85 P73, SITTING: 150/79 P80, STANDING: 152/79 P85.  Weight change:   Intake/Output Summary (Last 24 hours) at 08/30/11 0853 Last data filed at 08/30/11 0746  Gross per 24 hour  Intake    600 ml  Output   1100 ml  Net   -500 ml   Physical Exam: General appearance: alert, making good eye contact, sitting upright in bed, cooperative, and in no distress HEENT: Normocephalic, without obvious abnormality, nasal canula in place  Lungs: equal air entry bilaterally with mild expiratory wheezes in upper fields, no rales, rhonchi Heart: S1, S2 normal, RRR  Abdomen: soft, non-tender, mildly distended; bowel sounds normal;  Extremities: extremities normal, atraumatic, no cyanosis or edema  Pulses: 2+ and symmetric  Neurologic: AAOx3, CN VII: Right sided mild facial droop, prominent with attempted smile and Right eyelid close strength diminished compared to left - Patient states this has been present for years; remaining CN grossly intact; Motor: 5/5 equal strength in upper and lower extremities  Lab Results: Basic Metabolic Panel:  Lab 08/30/11 1610 08/29/11 0623  NA 138 138  K 3.7 4.3  CL 106 104  CO2 23 24  GLUCOSE  105* 140*  BUN 44* 45*  CREATININE 1.52* 2.03*  CALCIUM 8.7 8.5  MG -- --  PHOS -- --   Misc. Labs: Results for Terry, Terry Flynn (MRN [id number]) as of 08/30/2011 09:40  08/29/2011 19:59  Sodium, Ur 46  Creatinine, Urine 139.48  Fe(Na) %: 0.363  Studies/Results: US Renal  08/29/2011  *RADIOLOGY REPORT*  Clinical Data: Rising creatinine.  Question obstruction.  RENAL/URINARY TRACT ULTRASOUND COMPLETE  Comparison:  None.  Findings:  Right Kidney:  Measures 10.7 cm.  No stone or hydronephrosis. Multiple cysts are identified with the largest in the upper pole measuring 2.1 cm.  Left Kidney:  Measures 11.5 cm.  No stone or hydronephrosis. Multiple cysts are identified measuring up to 5.4 cm in diameter.  Bladder:  Unremarkable.  IMPRESSION: Negative for hydronephrosis or acute finding.  Bilateral renal cysts.  Original Report Authenticated By: Bernadene Bell. Maricela Curet, M.D.   Medications: I have reviewed the patient's current medications. Scheduled Meds:    acetaminophen  650 mg Oral Q6H WA   albuterol  2.5 mg Nebulization Q6H   clopidogrel  75 mg Oral Q breakfast   doxycycline  100 mg Oral Q12H   enoxaparin  30 mg Subcutaneous Q24H   ipratropium  0.5 mg Nebulization Q6H   LORazepam  2 mg Oral Q8H   nicotine  21 mg Transdermal Daily   PARoxetine  10 mg Oral Q0700   predniSONE  40  mg Oral Q breakfast   sodium chloride  3 mL Intravenous Q12H   traZODone  25 mg Oral Once   white petrolatum       PRN Meds:.acetaminophen, albuterol, guaiFENesin, ipratropium, ondansetron (ZOFRAN) IV, senna-docusate, sodium chloride  Assessment & Plan by Problem  [name] is a 77 year old man with a past medical history significant for COPD, CAD s/p RCA stent, hyperlipidemia, depression, and anxiety, presenting with falls and confusion who on imaging was found to have a several day old non-hemorraghic infarct within the left parietal lobe. He also presented with a several day history of worsening  cough, SOB, and sputum production being managed as an acute exacerbation of his underlying COPD. Finally, he has a history of depression and anxiety since the passing of his wife that will be managed on this admission as well.  #Azotemia Patient had elevated BUN and Creatinine that have increased since admission labs. UA showed moderate leukocytosis and few bacteria however patient has been afebrile and denies dysuria. UA also showed hyaline casts. A renal US was performed showing no acute process and no hydronephrosis. With a BUN:Cr ratio >20, FeNa of 0.363% (<1%), hyaline casts, pre-renal cause considered most likely secondary to dehydration. Patient was on ARB which could cause renal vasoconstriction via decreased intraglomerular pressure, however this medication, along with his other anti-hypertensives were suspended at admission. -Encourage PO intake -consider IVF  #Hypertension This is a chronic condition and patient was on anti-hypertensive regimen at home including metoprolol succinate, amlodipine, and valsartan. With admission for CVA and soft pressures, treatment was suspended. Patient has been stable however BP has steadily increased. Will restart metoprolol and amlodipine however with BUN/Cr levels, hold valsartan. -Monitor BP -Hold valsartan 2/2 renal function -Restart metoprolol succinate 25mg  daily -Restart amlodipine 10mg  daily  #Falls and Confusion - stable Patient endorses year long history of increasing confusion with numerous falls. Etiology is most likely multifactorial with an acute CVA found on imaging. An underlying component of undiagnosed dementia may be present as well and will be evaluated on this admission. Lipid panel to assess stroke risk within normal limits. Patient may have an underlying dementia which was worked up with a B12, TSH, and RPR all within normal limits. Vital signs negative for orthostatic changes. -continue clopidogrel  -continue PT  #COPD  exacerbation - resolved vs resolving Patient has a history of COPD with no home maintenance. No He presented with a several day history of cough, sputum production, and SOB. Will discharge with maintenance medications including short-acting beta agonist and long-acting antimuscarinic -Will start albuterol nebulizer Q6 scheduled  -Will start atrovent nebulizer Q6 scheduled  -Will order for separate albuterol and atrovent nebulizer Q2 PRN for tighter management  -prednisone  -Continue doxycycline  -consider flu vaccine per GOLD recommendations for COPD managment  #Depression and Anxiety - stable This has been an ongoing issue since the death of his wife in Feb 18, 1995. Patient has home medications of Ativan and Paxil which will be continued on his home scheduled regimen. If agitation not controlled with home regimen will consider adding a PRN Haldol.  -Continue Ativan 2mg  TID  -Continue Paxil 10mg  daily   #Chronic Smoking History  Patient has 50 pack year history of smoking and continues to smoke. Patient was counseled and is not interested in smoking cessation at this time.  -Nictotine patch during this admission   DVT prophylaxis: Lovenox   Disposition: Patient will require Home health PT and will discuss with family about home vs skilled nursing  facility upon discharge.     LOS: 3 days   Joseph Berkshire 08/30/2011, 8:53 AM

## 2011-08-30 NOTE — Progress Notes (Signed)
Orthostatic vitals for pt. LYING: 132/85 P73, SITTING: 150/79 P80, STANDING: 152/79 P85.

## 2011-08-30 NOTE — Progress Notes (Signed)
Clinical Social Work-Please see shadow chart for full assessment-pt very pleasant and respectfully declines ST SNF search for rehab. With pt permission CSW discussed dispo options with son while stating he will be going home regardless. Pt son very anxious and nervous with regards to caring for father at home and wants to provide the best care possible for his father. In addition to anxiety surrounding safety at home, pt son is getting married on Ssturday and he is clearly overwhelmed with care giving. Pt son will not go against his fathers wishes although he openly expresses he does not feel home is the safest option. At this point CSW unable to move forward without pt's permission as long as pt deemed to have capacity with regards to medical decision making. If pt remains adamant about return home CSW would recommend HHSW for safety eval. CSW will f/u after OT eval and rounds with treatment team in AM. Jodean Lima, [phone]

## 2011-08-30 NOTE — Progress Notes (Signed)
Resident Addendum to Medical Student Note   I have seen and examined the patient, and agree with the the medical student assessment and plan outlined above. Please see my brief note below for additional details.  HPI: Pt feeling well today.  No new concerns/complaints.  No acute events O/N.  OBJECTIVE: VS: Reviewed  Meds: Reviewed  Labs: Reviewed  Imaging: Reviewed   Physical Exam:  Gen: Sitting up in bed in no acute distress. Alert and oriented  HEENT: Normocephalic, atraumatic. Right facial droop noted  Neck: Supple, normal range of motion and no JVD, no carotid bruits  Lungs-CTAB with good air movement. No ronchi, wheezes, or crackles  Heart-S1-S2 present, RRR with occasional ectopy  Abdomen-Slightly distended but consistent with patient's baseline, nontender, bowel sounds are present  Extremities-no lower extremity edema, pulses dorsalis pedis palpable 2+  Neuro-alert and oriented x3, cranial nerves II through XII grossly intact, strength in upper and lower extremities equal 5 out of 5,   ASSESSMENT/ PLAN: CVA: pt remains neurologically stable with no new changes. TSH and A1c wnl. LDL at goal.  - continue Plavix  - continue PT/OT  A/CKI: Cr trending up. U/A reveals pyuria however pt asymptomatic (no dysuria, etc). Likely pre-renal given FeNa and results of renal u/s.. - IVFs - BMET in am  Fall and confusion: likely multifactorial and related to pts underlying dementia, acute CVA, and underlying CNS vascular disease.  - check orthostatic blood pressure  - continue with PT/OT  COPD exacerbation: this is improved. Plan for prednisone taper. He will need albuterol inhaler as well as rx for tiotropium on d/c.  Depression/anxiety: stable on current medication regimen.  Insomnia: pt reports difficulty sleeping. May be related to depression in addition to hospitalization. Will try trazodone tonight.  - Trazodone 25mg  qhs   Dispo: PT recommends d/c to home with HHPT. WIll need to  confirm with son; if pt cannot go home with son, will need SNF.  WIll enlist social work for assistance with dispo planning.    Length of Stay: 3   Nelda Bucks, Jacklyn Shell, Internal Medicine Resident 08/30/2011, 12:24 PM

## 2011-08-31 MED ORDER — TIOTROPIUM BROMIDE MONOHYDRATE 18 MCG IN CAPS: 18.0000 ug | ORAL_CAPSULE | Freq: Every day | RESPIRATORY_TRACT | Status: DC

## 2011-08-31 MED ORDER — CLOPIDOGREL BISULFATE 75 MG PO TABS: 75.0000 mg | ORAL_TABLET | Freq: Every day | ORAL | Status: DC

## 2011-08-31 MED ORDER — PREDNISONE 10 MG PO TABS: ORAL_TABLET | ORAL | Status: DC

## 2011-08-31 MED ORDER — DOXYCYCLINE HYCLATE 100 MG PO TABS: 100.0000 mg | ORAL_TABLET | Freq: Two times a day (BID) | ORAL | Status: AC

## 2011-08-31 NOTE — Discharge Summary (Signed)
Physician Discharge Summary  Patient ID: Terry Flynn MRN: [id number] DOB/AGE: [birth year] 77 y.o.  Admit date: 08/27/2011 Discharge date: 08/31/2011  Admission Diagnoses: Increased falls, right facial droop, confusion COPD CAD s/p RCA stent HTN Depression/Anxiety Dyslipidemia Tobacco Abuse  Discharge Diagnoses:  CVA - ischemic stroke COPD CAD s/p RCA stent HTN Depression/Anxiety Dyslipidemia Tobacco Abuse Frequent falls  Discharged Condition: stable  Disposition and follow up: Terry Flynn will follow-up in the General Internal Medicine clinic on December 21st at 2:15pm with Dr. Allena Katz. During this visit the physician should follow-up on his respiratory status to ensure resolution of COPD exacerbation and also assess his progress with home health PT/OT; for now patient is living with son but may need to move to SNF if he has significant difficulty with his ADLs.  Would consider MMSE to evaluate pts underlying cognitive function and capacity to engage in medical decision making.  Hospital Course:  Please refer to admission H&P for full details, HPI, admission labs and vitals  #CVA; subacute/acute parietal infarct:  Initial non-contrasted CT head was unrevealing for SDH, SAH, or large CVA. MRI brain and MRA were performed to further evaluate his symptoms. The MRA showed occluded vs slow flow within the right vertebral artery and the MRI showed an acute/subacute infarct in the left parietal lobe with global advanced atrophy and diffuse moderate white matter disease likely a sequelae of chronic microvascular ischemia. A lipid panel to assess stroke risk returned with an LDL at goal and lipids within normal limits. 12 lead EKG revealed NSR and no evidence of arrythmia, ischemia, or other concerning cardiac pathology.  He was monitored on telemetry for the duration of his stay without any events.  He was taking daily ASA 81mg  on admission; this was discontinued and Plavix was  started. PT/OT were consulted and advised continue PT/OT at home; pt and son were agreeable with this plan and the pt was discharged home with PT/OT services.  #COPD: pt presented with increased SOB an increased productive cough.  He has never been formally tested for obstructive lung disease but his smoking hx , exam findings, and symptoms  Were consistent with an acute COPD exacerbation.  Pt was treated with albuterol and atrovent nebs as well as prednisone and doxycycline.   He will continue doxycycline for 6 days at home as well as a prednisone taper.  Additionally, he was sent home with an albuterol inhaler and prescription for Spiriva.  #Frequent falls and confusion: this is multifactorial and related to pt CVA/vascular CNS disease as well as his underlying dementia.  PT/OT consultation was obtained as outlined above.  Orthostatic vitals were negative.  B12, TSH, and RPR were all within normal limits.    #HTN: pt was continued on his home medications without incident and his blood pressure remained within acceptable limits for the duration of his stay.  #Depression/anxiety: pt was continued on his home medication, Paxil.  He denied any SI/HI.  #Tobacco addiction: pt was counseled extensively about smoking cessation.  A smoking cessation consult was obtained and nicotine patch offered.  Pt is not interested in smoking cessation at this time  Consults: none  Significant Diagnostic Studies:   08/27/11: CT head without contrast: Acute to subacute left posterior parietal infarct. Atrophy, chronic small vessel disease.  08/27/11: MRI brain: 1. Signal abnormality within the left parietal lobe is compatible with a an acute to subacute infarct, likely at least several days old. 2. Advanced atrophy and moderate white matter disease likely  reflects the sequelae of chronic microvascular ischemia. 3. Moderate diffuse sinus disease.  08/27/11: MRA head: 1. Occluded or slow flow within the right vertebral  artery. 2. Moderate small vessel disease.  08/29/11: Renal U/S:  Negative for hydronephrosis or acute finding. Bilateral renal cysts.  Discharge Exam: Blood pressure 154/87, pulse 65, temperature 97.4 F (36.3 C), temperature source Oral, resp. rate 20, height 5\' 7"  (1.702 m), weight 198 lb 3.2 oz (89.903 kg), SpO2 93.00%. General appearance: alert, making good eye contact, sitting upright in bed, cooperative, and in no distress  HEENT: Normocephalic, without obvious abnormality, moist mucus membranes, nasal canula in place  Lungs: equal air entry and clear to ausculation bilaterally with no Wheezes, rales, rhonchi  Heart: S1, S2 normal, RRR  Abdomen: soft, non-tender, mildly distended; bowel sounds normal;  Extremities: extremities normal, atraumatic, no cyanosis or edema  Pulses: 2+ and symmetric  Neurologic: AAOx3, CN VII: Right sided mild facial droop, prominent with attempted smile and Right eyelid close strength diminished compared to left - Patient states this has been present for years; remaining CN grossly intact; Motor: 5/5 equal strength in upper and lower extremities   Disposition: Home or Self Care  Discharge Orders    Future Appointments: Provider: Department: Dept Phone: Center:   09/10/2011 2:15 PM [facility] [phone] [facility]     Future Orders Please Complete By Expires   Diet - low sodium heart healthy      Increase activity slowly        Current Discharge Medication List    START taking these medications   Details  clopidogrel (PLAVIX) 75 MG tablet Take 1 tablet (75 mg total) by mouth daily with breakfast. Qty: 30 tablet, Refills: 0    doxycycline (VIBRA-TABS) 100 MG tablet Take 1 tablet (100 mg total) by mouth every 12 (twelve) hours. Qty: 12 tablet, Refills: 0    predniSONE (DELTASONE) 10 MG tablet Take 3 tabs po qd x 3 days, then 2 tabs po qd x 3 days, then 1 tab po qd x 3 days, then stop    tiotropium (SPIRIVA HANDIHALER) 18 MCG  inhalation capsule Place 1 capsule (18 mcg total) into inhaler and inhale daily. Qty: 30 capsule, Refills: 0      CONTINUE these medications which have NOT CHANGED   Details  albuterol (PROAIR HFA) 108 (90 BASE) MCG/ACT inhaler Inhale 2 puffs into the lungs every 6 (six) hours as needed. For breathing    amLODipine (NORVASC) 10 MG tablet Take 10 mg by mouth daily.      metoprolol succinate (TOPROL-XL) 25 MG 24 hr tablet Take 25 mg by mouth daily.      PARoxetine (PAXIL) 10 MG tablet Take 10 mg by mouth every morning.      valsartan (DIOVAN) 320 MG tablet Take 320 mg by mouth daily.        STOP taking these medications     aspirin 81 MG chewable tablet        Follow-up Information    Follow up with PATEL,RAVI on 09/10/2011. (2:!5p)    Contact information:   [address] [place] [address] [phone]          Signed: Nelda Bucks 08/31/2011, 12:27 PM

## 2011-08-31 NOTE — Progress Notes (Signed)
Internal Medicine MS4 Progress Note  Subjective: Patient seen and examined bedside this morning. He had a headache last night at 9:00pm which was alleviated by tylenol. He denies any SOB or additional headaches. He still endorses non-productive cough though states it is "much better". Patient is aware and amenable to planned discharge today.  Objective: Vital signs in last 24 hours: Filed Vitals:   08/30/11 2100 08/31/11 0200 08/31/11 0500 08/31/11 1000  BP: 99/43 136/68 142/84 154/87  Pulse: 67 65 65 65  Temp: 98 F (36.7 C) 97.7 F (36.5 C) 98.3 F (36.8 C) 97.4 F (36.3 C)  TempSrc: Oral Oral Oral Oral  Resp: 18 18 17 20   Height:      Weight:      SpO2: 92% 96% 95% 93%   Weight change:   Intake/Output Summary (Last 24 hours) at 08/31/11 1201 Last data filed at 08/31/11 1100  Gross per 24 hour  Intake   1200 ml  Output    751 ml  Net    449 ml   Physical Exam: General appearance: alert, making good eye contact, sitting upright in bed, cooperative, and in no distress  HEENT: Normocephalic, without obvious abnormality, moist mucus membranes, nasal canula in place  Lungs: equal air entry and clear to ausculation bilaterally with no  Wheezes, rales, rhonchi  Heart: S1, S2 normal, RRR  Abdomen: soft, non-tender, mildly distended; bowel sounds normal;  Extremities: extremities normal, atraumatic, no cyanosis or edema  Pulses: 2+ and symmetric  Neurologic: AAOx3, CN VII: Right sided mild facial droop, prominent with attempted smile and Right eyelid close strength diminished compared to left - Patient states this has been present for years; remaining CN grossly intact; Motor: 5/5 equal strength in upper and lower extremities  Lab Results: Basic Metabolic Panel:  Lab 08/30/11 1610 08/29/11 0623  NA 138 138  K 3.7 4.3  CL 106 104  CO2 23 24  GLUCOSE 105* 140*  BUN 44* 45*  CREATININE 1.52* 2.03*  CALCIUM 8.7 8.5  MG -- --  PHOS -- --   Medications: I have reviewed the  patient's current medications. Scheduled Meds:   . acetaminophen  650 mg Oral Q6H WA  . albuterol  2.5 mg Nebulization Q6H  . amLODipine  10 mg Oral Daily  . clopidogrel  75 mg Oral Q breakfast  . doxycycline  100 mg Oral Q12H  . enoxaparin  30 mg Subcutaneous Q24H  . ipratropium  0.5 mg Nebulization Q6H  . LORazepam  2 mg Oral Q8H  . metoprolol succinate  25 mg Oral Daily  . nicotine  21 mg Transdermal Daily  . PARoxetine  10 mg Oral Q0700  . predniSONE  40 mg Oral Q breakfast  . sodium chloride  3 mL Intravenous Q12H  . sodium chloride  3 mL Intravenous Q12H   Continuous Infusions:   . sodium chloride 20 mL/hr at 08/30/11 2235  . 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 He also presented with a several day history of worsening cough, SOB, and sputum production being managed as an acute exacerbation of his underlying COPD.  Finally, he has a history of depression and anxiety since the passing of his wife that will be managed on this admission as well.   #Hypertension  This is a chronic condition and patient was on anti-hypertensive regimen at home including metoprolol succinate, amlodipine, and valsartan. With admission for CVA and soft pressures, treatment was suspended. Patient has been stable however BP has steadily increased. Restarted metoprolol and amlodipine however with prior BUN/Cr levels, hold valsartan.  -Monitor BP  -continue metoprolol succinate 25mg  daily  -continue amlodipine 10mg  daily   #Azotemia  Patient had elevated BUN and  Creatinine that have increased since admission labs. UA showed moderate leukocytosis and few bacteria however patient has been afebrile and denies dysuria. UA also showed hyaline casts. A renal US was performed showing no acute process and no hydronephrosis. With a BUN:Cr ratio >20, FeNa of 0.363% (<1%), hyaline casts, pre-renal cause considered most likely secondary to dehydration. Patient was given IVF boluses and encouraged to increase PO liquid intake. Clinically euvolemic. -continue to encourage PO fluid intake    #Falls and Confusion - stable  Patient endorses year long history of increasing confusion with numerous falls. Etiology is most likely multifactorial with an acute CVA found on imaging. An underlying component of undiagnosed dementia may be present as well and will be evaluated on this admission. Lipid panel to assess stroke risk within normal limits. Patient may have an underlying dementia which was worked up with a B12, TSH, and RPR all within normal limits. Vital signs negative for orthostatic changes.  -continue clopidogrel at home -continue PT and OT at home as home health visits  #COPD exacerbation - resolved vs resolving  Patient has a history of COPD with no home maintenance. No He presented with a several day history of cough, sputum production, and SOB. Will discharge with maintenance medications including short-acting beta agonist and long-acting antimuscarinic  -prednisone taper on discharge -will discharge with albuterol and spireva  #Depression and Anxiety - stable  This has been an ongoing issue since the death of his wife in 02/22/95. Patient has home medications of Ativan and Paxil which will be continued on his home scheduled regimen. If agitation not controlled with home regimen will consider adding a PRN Haldol.  -Continue Ativan 2mg  TID  -Continue Paxil 10mg  daily   #Chronic Smoking History  Patient has 50 pack year history of smoking and continues to smoke. Patient  was counseled and is not interested in smoking cessation at this time.  -Nictotine patch during this admission  DVT prophylaxis: Lovenox   Disposition: Patient will require Home health PT and OT, will be going home with son.     LOS: 4 days   Terry Flynn 08/31/2011, 12:01 PM  I have examined the patient with Terry Flynn (MS IV) and agree with the above assessment and plan with changes outlined in my full note.  Please refer to discharge summary for full details.  Nelda Bucks, DO

## 2011-09-01 NOTE — Progress Notes (Signed)
09/01/2011 Late entry:    CARE MANAGEMENT NOTE 09/01/2011  Patient:  Terry Flynn, Terry Flynn   Account Number:  [id number]  Date Initiated:  09/01/2011  Documentation initiated by:  Johny Shock  Subjective/Objective Assessment:   Pt requiring HH PT     Action/Plan:   Met with pt and son and HHPT arranged with [facility].   Anticipated DC Date:  08/31/2011   Anticipated DC Plan:  HOME W HOME HEALTH SERVICES         [facility] Choice  HOME HEALTH   Choice offered to / List presented to:  C-4 Adult Children        HH arranged  HH-2 PT      Status of service:  Completed, signed off Medicare Important Message given?   (If response is "NO", the following Medicare IM given date fields will be blank) Date Medicare IM given:   Date Additional Medicare IM given:    Discharge Disposition:  HOME W HOME HEALTH SERVICES  Per UR Regulation:  Reviewed for med. necessity/level of care/duration of stay  Comments:  Pt to d/c to home, selected [facility] for HHPT, lives with son.

## 2011-09-10 ENCOUNTER — Encounter: Payer: Medicare Other | Admitting: Internal Medicine

## 2011-10-12 ENCOUNTER — Other Ambulatory Visit: Payer: Self-pay | Admitting: *Deleted

## 2011-10-12 NOTE — Telephone Encounter (Signed)
Pt never seen by [place] outside of hospital. Please ask him to call and make appt and then I will be happy to provide refill.

## 2011-10-12 NOTE — Telephone Encounter (Signed)
Pt cancelled  appointment in the Clinics.  Angelina Ok, RN 10/12/2011 2:19 PM.

## 2011-10-13 NOTE — Telephone Encounter (Signed)
Pt called and message left for him to call clinic so I can make appointment if he is to f/u with our clilnic

## 2011-10-26 ENCOUNTER — Other Ambulatory Visit: Payer: Self-pay | Admitting: Internal Medicine

## 2011-10-26 NOTE — Telephone Encounter (Signed)
Pt states he sees Dr. Christell Constant in [facility]; [facility] was called.

## 2011-11-01 ENCOUNTER — Other Ambulatory Visit: Payer: Self-pay | Admitting: Internal Medicine

## 2011-12-01 ENCOUNTER — Encounter: Payer: Self-pay | Admitting: Cardiovascular Disease

## 2011-12-09 ENCOUNTER — Ambulatory Visit: Payer: Medicare Other | Admitting: Physical Therapy

## 2011-12-13 ENCOUNTER — Ambulatory Visit: Payer: Medicare Other | Attending: Family Medicine | Admitting: Physical Therapy

## 2011-12-13 DIAGNOSIS — R5381 Other malaise: Secondary | ICD-10-CM | POA: Insufficient documentation

## 2011-12-13 DIAGNOSIS — IMO0001 Reserved for inherently not codable concepts without codable children: Secondary | ICD-10-CM | POA: Insufficient documentation

## 2011-12-13 DIAGNOSIS — R269 Unspecified abnormalities of gait and mobility: Secondary | ICD-10-CM | POA: Insufficient documentation

## 2011-12-15 ENCOUNTER — Ambulatory Visit: Payer: Medicare Other | Admitting: Physical Therapy

## 2011-12-21 ENCOUNTER — Ambulatory Visit: Payer: Medicare Other | Attending: Family Medicine | Admitting: *Deleted

## 2011-12-21 DIAGNOSIS — IMO0001 Reserved for inherently not codable concepts without codable children: Secondary | ICD-10-CM | POA: Insufficient documentation

## 2011-12-21 DIAGNOSIS — R5381 Other malaise: Secondary | ICD-10-CM | POA: Insufficient documentation

## 2011-12-21 DIAGNOSIS — R269 Unspecified abnormalities of gait and mobility: Secondary | ICD-10-CM | POA: Insufficient documentation

## 2011-12-23 ENCOUNTER — Ambulatory Visit: Payer: Medicare Other | Admitting: *Deleted

## 2011-12-28 ENCOUNTER — Ambulatory Visit: Payer: Medicare Other | Admitting: *Deleted

## 2011-12-30 ENCOUNTER — Ambulatory Visit: Payer: Medicare Other | Admitting: *Deleted

## 2012-01-04 ENCOUNTER — Ambulatory Visit: Payer: Medicare Other | Admitting: *Deleted

## 2012-01-06 ENCOUNTER — Ambulatory Visit: Payer: Medicare Other | Admitting: *Deleted

## 2012-01-11 ENCOUNTER — Ambulatory Visit: Payer: Medicare Other | Admitting: *Deleted

## 2012-01-13 ENCOUNTER — Encounter: Payer: Medicare Other | Admitting: *Deleted

## 2012-01-14 ENCOUNTER — Ambulatory Visit: Payer: Medicare Other | Admitting: Cardiovascular Disease

## 2012-02-20 ENCOUNTER — Other Ambulatory Visit: Payer: Self-pay | Admitting: Internal Medicine

## 2012-02-21 NOTE — Telephone Encounter (Signed)
Pt of Dr. Christell Constant of [facility] per pt 10/26/11.

## 2012-02-23 ENCOUNTER — Emergency Department (HOSPITAL_COMMUNITY): Payer: Medicare Other

## 2012-02-23 ENCOUNTER — Inpatient Hospital Stay (HOSPITAL_COMMUNITY)
Admission: EM | Admit: 2012-02-23 | Discharge: 2012-02-26 | DRG: 190 | Disposition: A | Discharge status: Home / Self Care | Payer: Medicare Other | Attending: Family Medicine | Admitting: Family Medicine

## 2012-02-23 ENCOUNTER — Encounter (HOSPITAL_COMMUNITY): Payer: Self-pay | Admitting: *Deleted

## 2012-02-23 DIAGNOSIS — I10 Essential (primary) hypertension: Secondary | ICD-10-CM

## 2012-02-23 DIAGNOSIS — N189 Chronic kidney disease, unspecified: Secondary | ICD-10-CM | POA: Diagnosis present

## 2012-02-23 DIAGNOSIS — I129 Hypertensive chronic kidney disease with stage 1 through stage 4 chronic kidney disease, or unspecified chronic kidney disease: Secondary | ICD-10-CM | POA: Diagnosis present

## 2012-02-23 DIAGNOSIS — F341 Dysthymic disorder: Secondary | ICD-10-CM | POA: Diagnosis present

## 2012-02-23 DIAGNOSIS — Z8673 Personal history of transient ischemic attack (TIA), and cerebral infarction without residual deficits: Secondary | ICD-10-CM

## 2012-02-23 DIAGNOSIS — J449 Chronic obstructive pulmonary disease, unspecified: Secondary | ICD-10-CM

## 2012-02-23 DIAGNOSIS — F0390 Unspecified dementia without behavioral disturbance: Secondary | ICD-10-CM | POA: Diagnosis present

## 2012-02-23 DIAGNOSIS — I2510 Atherosclerotic heart disease of native coronary artery without angina pectoris: Secondary | ICD-10-CM | POA: Diagnosis present

## 2012-02-23 DIAGNOSIS — Z7902 Long term (current) use of antithrombotics/antiplatelets: Secondary | ICD-10-CM

## 2012-02-23 DIAGNOSIS — R51 Headache: Secondary | ICD-10-CM

## 2012-02-23 DIAGNOSIS — J438 Other emphysema: Secondary | ICD-10-CM

## 2012-02-23 DIAGNOSIS — R635 Abnormal weight gain: Secondary | ICD-10-CM

## 2012-02-23 DIAGNOSIS — Z9861 Coronary angioplasty status: Secondary | ICD-10-CM

## 2012-02-23 DIAGNOSIS — Z825 Family history of asthma and other chronic lower respiratory diseases: Secondary | ICD-10-CM

## 2012-02-23 DIAGNOSIS — I472 Ventricular tachycardia: Secondary | ICD-10-CM

## 2012-02-23 DIAGNOSIS — R0989 Other specified symptoms and signs involving the circulatory and respiratory systems: Secondary | ICD-10-CM

## 2012-02-23 DIAGNOSIS — E7800 Pure hypercholesterolemia, unspecified: Secondary | ICD-10-CM

## 2012-02-23 DIAGNOSIS — E785 Hyperlipidemia, unspecified: Secondary | ICD-10-CM | POA: Diagnosis present

## 2012-02-23 DIAGNOSIS — J441 Chronic obstructive pulmonary disease with (acute) exacerbation: Principal | ICD-10-CM | POA: Diagnosis present

## 2012-02-23 DIAGNOSIS — N200 Calculus of kidney: Secondary | ICD-10-CM

## 2012-02-23 DIAGNOSIS — R296 Repeated falls: Secondary | ICD-10-CM

## 2012-02-23 DIAGNOSIS — D696 Thrombocytopenia, unspecified: Secondary | ICD-10-CM

## 2012-02-23 DIAGNOSIS — Z79899 Other long term (current) drug therapy: Secondary | ICD-10-CM

## 2012-02-23 DIAGNOSIS — K219 Gastro-esophageal reflux disease without esophagitis: Secondary | ICD-10-CM

## 2012-02-23 DIAGNOSIS — Z800 Family history of malignant neoplasm of digestive organs: Secondary | ICD-10-CM

## 2012-02-23 DIAGNOSIS — K4090 Unilateral inguinal hernia, without obstruction or gangrene, not specified as recurrent: Secondary | ICD-10-CM

## 2012-02-23 DIAGNOSIS — F17200 Nicotine dependence, unspecified, uncomplicated: Secondary | ICD-10-CM | POA: Diagnosis present

## 2012-02-23 DIAGNOSIS — Z8249 Family history of ischemic heart disease and other diseases of the circulatory system: Secondary | ICD-10-CM

## 2012-02-23 DIAGNOSIS — M549 Dorsalgia, unspecified: Secondary | ICD-10-CM

## 2012-02-23 DIAGNOSIS — I639 Cerebral infarction, unspecified: Secondary | ICD-10-CM | POA: Diagnosis present

## 2012-02-23 DIAGNOSIS — J189 Pneumonia, unspecified organism: Secondary | ICD-10-CM | POA: Diagnosis present

## 2012-02-23 DIAGNOSIS — R531 Weakness: Secondary | ICD-10-CM

## 2012-02-23 DIAGNOSIS — IMO0002 Reserved for concepts with insufficient information to code with codable children: Secondary | ICD-10-CM

## 2012-02-23 HISTORY — DX: Cerebral infarction, unspecified: I63.9

## 2012-02-23 LAB — COMPREHENSIVE METABOLIC PANEL
ALT: 11 U/L (ref 0–53)
AST: 11 U/L (ref 0–37)
Albumin: 3.4 g/dL — ABNORMAL LOW (ref 3.5–5.2)
Alkaline Phosphatase: 79 U/L (ref 39–117)
Chloride: 105 mEq/L (ref 96–112)
Potassium: 4.8 mEq/L (ref 3.5–5.1)
Sodium: 138 mEq/L (ref 135–145)
Total Protein: 7.1 g/dL (ref 6.0–8.3)

## 2012-02-23 LAB — URINALYSIS, ROUTINE W REFLEX MICROSCOPIC
Glucose, UA: NEGATIVE mg/dL
Hgb urine dipstick: NEGATIVE
Ketones, ur: NEGATIVE mg/dL
Protein, ur: NEGATIVE mg/dL

## 2012-02-23 LAB — CBC
MCH: 31.2 pg (ref 26.0–34.0)
MCH: 31.3 pg (ref 26.0–34.0)
MCHC: 32.5 g/dL (ref 30.0–36.0)
MCHC: 32.9 g/dL (ref 30.0–36.0)
Platelets: 154 10*3/uL (ref 150–400)
RDW: 13.9 % (ref 11.5–15.5)

## 2012-02-23 LAB — CARDIAC PANEL(CRET KIN+CKTOT+MB+TROPI)
Relative Index: INVALID (ref 0.0–2.5)
Total CK: 26 U/L (ref 7–232)

## 2012-02-23 LAB — DIFFERENTIAL
Basophils Absolute: 0 10*3/uL (ref 0.0–0.1)
Basophils Relative: 0 % (ref 0–1)
Eosinophils Absolute: 0.3 10*3/uL (ref 0.0–0.7)
Neutro Abs: 5.3 10*3/uL (ref 1.7–7.7)
Neutrophils Relative %: 58 % (ref 43–77)

## 2012-02-23 LAB — PROTIME-INR
INR: 0.91 (ref 0.00–1.49)
Prothrombin Time: 12.4 seconds (ref 11.6–15.2)

## 2012-02-23 LAB — CREATININE, SERUM
Creatinine, Ser: 1.38 mg/dL — ABNORMAL HIGH (ref 0.50–1.35)
GFR calc non Af Amer: 48 mL/min — ABNORMAL LOW (ref >90)

## 2012-02-23 MED ORDER — SODIUM CHLORIDE 0.9 % IV SOLN: INTRAVENOUS | Status: DC

## 2012-02-23 MED ORDER — ALBUTEROL SULFATE (5 MG/ML) 0.5% IN NEBU
2.5000 mg | INHALATION_SOLUTION | Freq: Four times a day (QID) | RESPIRATORY_TRACT | Status: DC
  Administered 2012-02-24 (×4): 2.5 mg via RESPIRATORY_TRACT
  Filled 2012-02-23 (×3): qty 0.5

## 2012-02-23 MED ORDER — DEXTROSE 5 % IV SOLN
500.0000 mg | Freq: Once | INTRAVENOUS | Status: AC
  Administered 2012-02-23: 500 mg via INTRAVENOUS
  Filled 2012-02-23: qty 500

## 2012-02-23 MED ORDER — AMLODIPINE BESYLATE 10 MG PO TABS
10.0000 mg | ORAL_TABLET | Freq: Every day | ORAL | Status: DC
  Administered 2012-02-24 – 2012-02-26 (×3): 10 mg via ORAL
  Filled 2012-02-23 (×3): qty 1

## 2012-02-23 MED ORDER — METHYLPREDNISOLONE SODIUM SUCC 40 MG IJ SOLR
40.0000 mg | Freq: Every day | INTRAMUSCULAR | Status: DC
  Administered 2012-02-23 – 2012-02-26 (×4): 40 mg via INTRAVENOUS
  Filled 2012-02-23 (×5): qty 1

## 2012-02-23 MED ORDER — LORAZEPAM 1 MG PO TABS
2.0000 mg | ORAL_TABLET | Freq: Three times a day (TID) | ORAL | Status: DC | PRN
Start: 2012-02-23 — End: 2012-02-26
  Administered 2012-02-23 – 2012-02-26 (×6): 2 mg via ORAL
  Filled 2012-02-23 (×7): qty 2

## 2012-02-23 MED ORDER — PAROXETINE HCL 20 MG PO TABS
20.0000 mg | ORAL_TABLET | Freq: Every day | ORAL | Status: DC
  Administered 2012-02-24 – 2012-02-26 (×3): 20 mg via ORAL
  Filled 2012-02-23 (×3): qty 1

## 2012-02-23 MED ORDER — FEBUXOSTAT 80 MG PO TABS: 40.0000 mg | ORAL_TABLET | Freq: Every day | ORAL | Status: DC

## 2012-02-23 MED ORDER — ONDANSETRON HCL 4 MG/2ML IJ SOLN: 4.0000 mg | Freq: Three times a day (TID) | INTRAMUSCULAR | Status: DC | PRN

## 2012-02-23 MED ORDER — ONDANSETRON HCL 4 MG PO TABS: 4.0000 mg | ORAL_TABLET | Freq: Four times a day (QID) | ORAL | Status: DC | PRN

## 2012-02-23 MED ORDER — SODIUM CHLORIDE 0.9 % IJ SOLN
3.0000 mL | Freq: Two times a day (BID) | INTRAMUSCULAR | Status: DC
  Administered 2012-02-24 – 2012-02-26 (×4): 3 mL via INTRAVENOUS

## 2012-02-23 MED ORDER — CLOPIDOGREL BISULFATE 75 MG PO TABS
75.0000 mg | ORAL_TABLET | Freq: Every day | ORAL | Status: DC
  Administered 2012-02-24 – 2012-02-26 (×3): 75 mg via ORAL
  Filled 2012-02-23 (×3): qty 1

## 2012-02-23 MED ORDER — ONDANSETRON HCL 4 MG/2ML IJ SOLN: 4.0000 mg | Freq: Four times a day (QID) | INTRAMUSCULAR | Status: DC | PRN

## 2012-02-23 MED ORDER — SODIUM CHLORIDE 0.9 % IJ SOLN
3.0000 mL | Freq: Two times a day (BID) | INTRAMUSCULAR | Status: DC
  Administered 2012-02-24 – 2012-02-25 (×2): 3 mL via INTRAVENOUS

## 2012-02-23 MED ORDER — ACETAMINOPHEN 650 MG RE SUPP: 650.0000 mg | Freq: Four times a day (QID) | RECTAL | Status: DC | PRN

## 2012-02-23 MED ORDER — IPRATROPIUM BROMIDE 0.02 % IN SOLN
0.5000 mg | Freq: Four times a day (QID) | RESPIRATORY_TRACT | Status: DC
  Administered 2012-02-24 (×3): 0.5 mg via RESPIRATORY_TRACT
  Filled 2012-02-23 (×3): qty 2.5

## 2012-02-23 MED ORDER — METOPROLOL SUCCINATE ER 25 MG PO TB24
25.0000 mg | ORAL_TABLET | Freq: Every day | ORAL | Status: DC
  Administered 2012-02-24 – 2012-02-25 (×2): 25 mg via ORAL
  Filled 2012-02-23 (×3): qty 1

## 2012-02-23 MED ORDER — IRBESARTAN 300 MG PO TABS
300.0000 mg | ORAL_TABLET | Freq: Every day | ORAL | Status: DC
  Administered 2012-02-24 – 2012-02-26 (×3): 300 mg via ORAL
  Filled 2012-02-23 (×3): qty 1

## 2012-02-23 MED ORDER — DEXTROSE 5 % IV SOLN
1.0000 g | INTRAVENOUS | Status: DC
  Administered 2012-02-23 – 2012-02-25 (×3): 1 g via INTRAVENOUS
  Filled 2012-02-23 (×4): qty 10

## 2012-02-23 MED ORDER — DEXTROSE 5 % IV SOLN
1.0000 g | Freq: Once | INTRAVENOUS | Status: AC
  Administered 2012-02-23: 1 g via INTRAVENOUS
  Filled 2012-02-23: qty 10

## 2012-02-23 MED ORDER — NICOTINE 21 MG/24HR TD PT24
21.0000 mg | MEDICATED_PATCH | Freq: Once | TRANSDERMAL | Status: AC
  Administered 2012-02-23: 21 mg via TRANSDERMAL

## 2012-02-23 MED ORDER — NICOTINE 21 MG/24HR TD PT24
MEDICATED_PATCH | TRANSDERMAL | Status: AC
  Administered 2012-02-23: 21 mg via TRANSDERMAL
  Filled 2012-02-23: qty 1

## 2012-02-23 MED ORDER — ACETAMINOPHEN 325 MG PO TABS
650.0000 mg | ORAL_TABLET | Freq: Four times a day (QID) | ORAL | Status: DC | PRN
  Administered 2012-02-24 (×2): 650 mg via ORAL
  Filled 2012-02-23 (×2): qty 2

## 2012-02-23 MED ORDER — DEXTROSE 5 % IV SOLN
500.0000 mg | INTRAVENOUS | Status: DC
  Administered 2012-02-25: 500 mg via INTRAVENOUS
  Filled 2012-02-23 (×3): qty 500

## 2012-02-23 MED ORDER — FEBUXOSTAT 40 MG PO TABS
40.0000 mg | ORAL_TABLET | Freq: Every day | ORAL | Status: DC | PRN
  Filled 2012-02-23: qty 2

## 2012-02-23 MED ORDER — ENOXAPARIN SODIUM 40 MG/0.4ML ~~LOC~~ SOLN
40.0000 mg | Freq: Every day | SUBCUTANEOUS | Status: DC
  Administered 2012-02-24 – 2012-02-26 (×3): 40 mg via SUBCUTANEOUS
  Filled 2012-02-23 (×3): qty 0.4

## 2012-02-23 MED ORDER — PANTOPRAZOLE SODIUM 40 MG PO TBEC
40.0000 mg | DELAYED_RELEASE_TABLET | Freq: Every day | ORAL | Status: DC
  Administered 2012-02-23 – 2012-02-25 (×3): 40 mg via ORAL
  Filled 2012-02-23 (×2): qty 1

## 2012-02-23 MED ORDER — BUDESONIDE 0.5 MG/2ML IN SUSP
0.5000 mg | Freq: Two times a day (BID) | RESPIRATORY_TRACT | Status: DC
  Administered 2012-02-24 – 2012-02-26 (×5): 0.5 mg via RESPIRATORY_TRACT
  Filled 2012-02-23 (×8): qty 2

## 2012-02-23 MED ORDER — ALBUTEROL SULFATE (5 MG/ML) 0.5% IN NEBU
2.5000 mg | INHALATION_SOLUTION | RESPIRATORY_TRACT | Status: DC | PRN
  Filled 2012-02-23: qty 0.5

## 2012-02-23 MED ORDER — OLMESARTAN MEDOXOMIL 40 MG PO TABS: 40.0000 mg | ORAL_TABLET | Freq: Every day | ORAL | Status: DC

## 2012-02-23 NOTE — ED Provider Notes (Signed)
History     CSN: [id number]  Arrival date & time 02/23/12  03/13/1547   First MD Initiated Contact with Patient 02/23/12 [phone]      Chief Complaint  Patient presents with  . Weakness    (Consider location/radiation/quality/duration/timing/severity/associated sxs/prior treatment) HPI Comments: Patient presents from home with generalized weakness, headaches and bilateral leg pain for the past 3 days. Is a history of stroke in December his family is concerned that the symptoms are representative of another. Patient states she said daily headaches ever since his stroke that are gradual in onset and wax and wane in severity. Complains of pain from the knee down in his bilateral legs for the past several weeks as well. He feels generally weak and has difficulty walking with his walker. He denies any chest pain, shortness of breath, fever, chills. A good appetite no vomiting or diarrhea.  Patient is a 78 y.o. male presenting with weakness. The history is provided by the patient.  Weakness The primary symptoms include headaches. Primary symptoms do not include fever, nausea or vomiting.  The headache is associated with weakness.  Additional symptoms include weakness.    Past Medical History  Diagnosis Date  . Back pain     DJD  . HTN (hypertension)   . CAD (coronary artery disease)     stent to RCA 12-Mar-1996 and 2003/03/13  . COPD (chronic obstructive pulmonary disease)   . GERD (gastroesophageal reflux disease)   . Headache     chronic, worse over last month  . Chronic renal insufficiency     baseline Cr around 1.4  . Depression 1996    since wife passed away  . Anxiety 1996    since wife passed away  . Confusion 2010-03-12    progressive since Mar 12, 2010, question of dementia  . HLD (hyperlipidemia)   . Stroke     Past Surgical History  Procedure Date  . Coronary stent placement 1997    RCA  . Coronary stent placement 03/13/2003    in-stent restenosis, stent replaced RCA.      Family History  Problem  Relation Age of Onset  . Throat cancer Father     Smoker  . Heart disease Brother   . Asthma Son   . Heart disease Brother     History  Substance Use Topics  . Smoking status: Current Some Day Smoker -- 1.0 packs/day for 50 years    Types: Cigarettes  . Smokeless tobacco: Not on file  . Alcohol Use: Yes     very occasional      Review of Systems  Constitutional: Positive for activity change, appetite change and fatigue. Negative for fever.  HENT: Negative for congestion and rhinorrhea.   Respiratory: Negative for cough, chest tightness and shortness of breath.   Cardiovascular: Negative for chest pain.  Gastrointestinal: Negative for nausea, vomiting and abdominal pain.  Genitourinary: Negative for dysuria and hematuria.  Musculoskeletal: Positive for myalgias, arthralgias and gait problem.  Skin: Negative for rash.  Neurological: Positive for weakness, light-headedness and headaches. Negative for numbness.    Allergies  Statins  Home Medications   No current outpatient prescriptions on file.  BP 129/72  Pulse 66  Temp(Src) 97.6 F (36.4 C) (Oral)  Resp 20  Ht 5\' 7"  (1.702 m)  Wt 207 lb 8 oz (94.121 kg)  BMI 32.50 kg/m2  SpO2 93%  Physical Exam  Constitutional: He is oriented to person, place, and time. He appears well-developed and well-nourished. No distress.  HENT:  Head: Normocephalic and atraumatic.  Mouth/Throat: Oropharynx is clear and moist. No oropharyngeal exudate.  Eyes: Conjunctivae are normal. Pupils are equal, round, and reactive to light.  Neck: Normal range of motion. Neck supple.  Cardiovascular: Normal rate and regular rhythm.   No murmur heard. Pulmonary/Chest: Effort normal and breath sounds normal. No respiratory distress.  Abdominal: Soft. There is no tenderness. There is no rebound and no guarding.  Musculoskeletal: He exhibits no edema and no tenderness.       Lower extremities are cool to touch bilaterally. +2 DP and PT pulses. 5  Out of 5 strength  Neurological: He is alert and oriented to person, place, and time. No cranial nerve deficit.       Cranial nerves 3-12 intact, no ataxia finger to nose, no facial droop, 5 out of 5 strength throughout, no pronator drift.  Skin: Skin is warm.    ED Course  Procedures (including critical care time)  Labs Reviewed  COMPREHENSIVE METABOLIC PANEL - Abnormal; Notable for the following:    BUN 29 (*)    Creatinine, Ser 1.44 (*)    Albumin 3.4 (*)    Total Bilirubin 0.1 (*)    GFR calc non Af Amer 45 (*)    GFR calc Af Amer 53 (*)    All other components within normal limits  URINALYSIS, ROUTINE W REFLEX MICROSCOPIC - Abnormal; Notable for the following:    Bilirubin Urine SMALL (*)    All other components within normal limits  COMPREHENSIVE METABOLIC PANEL - Abnormal; Notable for the following:    CO2 17 (*)    Glucose, Bld 154 (*)    BUN 28 (*)    Albumin 3.4 (*)    Total Bilirubin 0.2 (*)    GFR calc non Af Amer 49 (*)    GFR calc Af Amer 57 (*)    All other components within normal limits  CBC - Abnormal; Notable for the following:    Platelets 142 (*)    All other components within normal limits  CREATININE, SERUM - Abnormal; Notable for the following:    Creatinine, Ser 1.38 (*)    GFR calc non Af Amer 48 (*)    GFR calc Af Amer 55 (*)    All other components within normal limits  CBC  DIFFERENTIAL  CARDIAC PANEL(CRET KIN+CKTOT+MB+TROPI)  PROTIME-INR  CBC  GLUCOSE, CAPILLARY  URINALYSIS, ROUTINE W REFLEX MICROSCOPIC  TSH   Dg Chest 2 View  02/23/2012  *RADIOLOGY REPORT*  Clinical Data: Shortness of breath.  Generalized weakness.  CHEST - 2 VIEW  Comparison: Two-view chest x-ray 08/27/2011.  Findings: Cardiac silhouette mildly enlarged but stable.  Thoracic aorta mildly tortuous and atherosclerotic, unchanged.  Hilar and mediastinal contours otherwise unremarkable.  Airspace consolidation in the medial right lower lobe.  Calcified granulomata in the left  upper lobe, unchanged.  Lungs otherwise clear.  No pleural effusions.  Degenerative changes involving the thoracic spine.  IMPRESSION: Right lower lobe pneumonia.  Old granulomatous disease.  Stable mild cardiomegaly without pulmonary edema.  Original Report Authenticated By: Arnell Sieving, M.D.   Ct Head Wo Contrast  02/23/2012  *RADIOLOGY REPORT*  Clinical Data: Weakness, dizziness and blurred vision.  CT HEAD WITHOUT CONTRAST  Technique:  Contiguous axial images were obtained from the base of the skull through the vertex without contrast.  Comparison: Head CT 08/27/2011.  Brain MRI 08/27/2011.  Findings: Again noted is cerebral and cerebellar atrophy.  There is a well  defined region of low attenuation in the posterior left parietooccipital region, compatible with an area of encephalomalacia related to previously noted infarction.  No definite acute intracranial abnormality.  Specifically, no definite evidence of acute/subacute cerebral ischemia, no acute intracranial hemorrhage, no focal mass, mass effect, hydrocephalus or abnormal intra or extra-axial fluid collections.  There are patchy regions of subtle decreased attenuation throughout the deep and periventricular white matter of the cerebral hemispheres bilaterally, compatible with chronic microvascular ischemic changes.  No acute displaced skull fractures are identified. Visualized paranasal sinuses and mastoids are remarkable for extensive mucosal thickening throughout the ethmoids bilaterally, as well as the right maxillary sinus, with apparent thickening of the right turbinate (likely the superior turbinate).  IMPRESSION: 1.  No acute intracranial abnormalities. 2.  Moderate cerebral and cerebellar atrophy with chronic microvascular ischemic changes and area of encephalomalacia in the left parietooccipital region, similar to prior examinations. 3.  Chronic paranasal sinus disease, as detailed above, similar to the prior examination from 08/27/2011.   Original Report Authenticated By: Florencia Reasons, M.D.   Mr Brain Wo Contrast  02/23/2012  *RADIOLOGY REPORT*  Clinical Data: Dizzy and headache.  Generalized weakness.  MRI HEAD WITHOUT CONTRAST  Technique:  Multiplanar, multiecho pulse sequences of the brain and surrounding structures were obtained according to standard protocol without intravenous contrast.  Comparison: CT head without contrast to 02/23/2012.  MRI brain without contrast 08/27/2011.  Findings: The diffusion weighted images demonstrate no evidence for acute or subacute infarction.  Moderate generalized atrophy is similar to the prior study.  White matter disease is predominately periventricular and without significant interval change.  White matter disease in the brainstem is stable.  No hemorrhage or mass lesion is present.  The ventricles are proportionate to the degree of atrophy.  No significant extra-axial fluid collection is present.  Abnormal signal on the right vertebral artery is compatible with chronic occlusion or high-grade stenosis.  Flow is otherwise present within the major intracranial arteries.  Right maxillary sinus disease is again noted.  There is a pedunculated mass which extends from the right nasal polyp.  This may represent a polyp.  Scattered opacification of ethmoid air cells is similar to the prior study.  Diffuse mucosal thickening in the frontal sinuses bilaterally is stable.  There is minimal fluid in the mastoid air cells.  No obstructing nasopharyngeal lesion is present.  IMPRESSION:  1.  No acute intracranial abnormality or significant interval change. 2.  Stable atrophy and white matter disease.  This likely reflects the sequelae of chronic microvascular ischemia. 3.  Acute and chronic sinus disease.  Original Report Authenticated By: Jamesetta Orleans. MATTERN, M.D.     1. CAP (community acquired pneumonia)   2. Weakness       MDM  Generalized weakness, headaches, leg pain. No focal weakness on exam.  No signs of acute limb ischemia. No headache red flags.  No distress, scattered wheezes on exam.  CT head neg. No focal deficits.  CXR with infiltrate.  Treating for CAP with azithromycin and rocephin.  Also nebs for COPD. MRI brain pending to evaluate for possible new infarct.   Date: 02/23/2012  Rate: 55  Rhythm: normal sinus rhythm  QRS Axis: normal  Intervals: normal  ST/T Wave abnormalities: normal  Conduction Disutrbances:none  Narrative Interpretation:   Old EKG Reviewed: unchanged    Glynn Octave, MD 02/24/12 1329

## 2012-02-23 NOTE — H&P (Signed)
Terry Flynn is an 78 y.o. male.   PCP - Dr.Donald Christell Constant. Chief Complaint: Weakness and shortness of breath. HPI: 78 year old male with known history of COPD with ongoing tobacco abuse, CAD status post stenting, hypertension and history of CVA last December was brought from home because of increasing weakness and shortness of breath and confusion. Patient has gradually worsening shortness of breath with audible wheeze. He also has been feeling weak last few days. Last week he had some episodes of diarrhea but last 3 days it is completely resolved. Denies any chest pain or any nausea vomiting or abdominal pain. Shortness of breath is present even at rest. He not lose consciousness but family did notice some confusion. In the ER CT head and MRI of the brain did not show anything acute. Chest x-ray shows features consistent with pneumonia. Patient has been admitted for further workup.  Past Medical History  Diagnosis Date  . Back pain     DJD  . HTN (hypertension)   . CAD (coronary artery disease)     stent to RCA February 29, 1996 and March 01, 2003  . COPD (chronic obstructive pulmonary disease)   . GERD (gastroesophageal reflux disease)   . Headache     chronic, worse over last month  . Chronic renal insufficiency     baseline Cr around 1.4  . Depression 1996    since wife passed away  . Anxiety 1996    since wife passed away  . Confusion 2010/02/28    progressive since 28-Feb-2010, question of dementia  . HLD (hyperlipidemia)   . Stroke     Past Surgical History  Procedure Date  . Coronary stent placement 1997    RCA  . Coronary stent placement Mar 01, 2003    in-stent restenosis, stent replaced RCA.      Family History  Problem Relation Age of Onset  . Throat cancer Father     Smoker  . Heart disease Brother   . Asthma Son   . Heart disease Brother    Social History:  reports that he has been smoking Cigarettes.  He has a 50 pack-year smoking history. He does not have any smokeless tobacco history on file. He  reports that he drinks alcohol. He reports that he does not use illicit drugs.  Allergies:  Allergies  Allergen Reactions  . Statins Other (See Comments)    REACTION: MYALGIA     (Not in a hospital admission)  Results for orders placed during the hospital encounter of 02/23/12 (from the past 48 hour(s))  CBC     Status: Normal   Collection Time   02/23/12  4:09 PM      Component Value Range Comment   WBC 9.1  4.0 - 10.5 (K/uL)    RBC 4.62  4.22 - 5.81 (MIL/uL)    Hemoglobin 14.4  13.0 - 17.0 (g/dL)    HCT 16.1  09.6 - 04.5 (%)    MCV 95.9  78.0 - 100.0 (fL)    MCH 31.2  26.0 - 34.0 (pg)    MCHC 32.5  30.0 - 36.0 (g/dL)    RDW 40.9  81.1 - 91.4 (%)    Platelets 154  150 - 400 (K/uL)   DIFFERENTIAL     Status: Normal   Collection Time   02/23/12  4:09 PM      Component Value Range Comment   Neutrophils Relative 58  43 - 77 (%)    Neutro Abs 5.3  1.7 - 7.7 (K/uL)  Lymphocytes Relative 29  12 - 46 (%)    Lymphs Abs 2.6  0.7 - 4.0 (K/uL)    Monocytes Relative 9  3 - 12 (%)    Monocytes Absolute 0.8  0.1 - 1.0 (K/uL)    Eosinophils Relative 4  0 - 5 (%)    Eosinophils Absolute 0.3  0.0 - 0.7 (K/uL)    Basophils Relative 0  0 - 1 (%)    Basophils Absolute 0.0  0.0 - 0.1 (K/uL)   COMPREHENSIVE METABOLIC PANEL     Status: Abnormal   Collection Time   02/23/12  4:09 PM      Component Value Range Comment   Sodium 138  135 - 145 (mEq/L)    Potassium 4.8  3.5 - 5.1 (mEq/L)    Chloride 105  96 - 112 (mEq/L)    CO2 23  19 - 32 (mEq/L)    Glucose, Bld 77  70 - 99 (mg/dL)    BUN 29 (*) 6 - 23 (mg/dL)    Creatinine, Ser 7.82 (*) 0.50 - 1.35 (mg/dL)    Calcium 9.1  8.4 - 10.5 (mg/dL)    Total Protein 7.1  6.0 - 8.3 (g/dL)    Albumin 3.4 (*) 3.5 - 5.2 (g/dL)    AST 11  0 - 37 (U/L)    ALT 11  0 - 53 (U/L)    Alkaline Phosphatase 79  39 - 117 (U/L)    Total Bilirubin 0.1 (*) 0.3 - 1.2 (mg/dL)    GFR calc non Af Amer 45 (*) >90 (mL/min)    GFR calc Af Amer 53 (*) >90 (mL/min)     PROTIME-INR     Status: Normal   Collection Time   02/23/12  4:09 PM      Component Value Range Comment   Prothrombin Time 12.4  11.6 - 15.2 (seconds)    INR 0.91  0.00 - 1.49    CARDIAC PANEL(CRET KIN+CKTOT+MB+TROPI)     Status: Normal   Collection Time   02/23/12  4:10 PM      Component Value Range Comment   Total CK 26  7 - 232 (U/L)    CK, MB 1.3  0.3 - 4.0 (ng/mL)    Troponin I <0.30  <0.30 (ng/mL)    Relative Index RELATIVE INDEX IS INVALID  0.0 - 2.5    URINALYSIS, ROUTINE W REFLEX MICROSCOPIC     Status: Abnormal   Collection Time   02/23/12  6:28 PM      Component Value Range Comment   Color, Urine YELLOW  YELLOW     APPearance CLEAR  CLEAR     Specific Gravity, Urine 1.025  1.005 - 1.030     pH 5.0  5.0 - 8.0     Glucose, UA NEGATIVE  NEGATIVE (mg/dL)    Hgb urine dipstick NEGATIVE  NEGATIVE     Bilirubin Urine SMALL (*) NEGATIVE     Ketones, ur NEGATIVE  NEGATIVE (mg/dL)    Protein, ur NEGATIVE  NEGATIVE (mg/dL)    Urobilinogen, UA 0.2  0.0 - 1.0 (mg/dL)    Nitrite NEGATIVE  NEGATIVE     Leukocytes, UA NEGATIVE  NEGATIVE  MICROSCOPIC NOT DONE ON URINES WITH NEGATIVE PROTEIN, BLOOD, LEUKOCYTES, NITRITE, OR GLUCOSE <1000 mg/dL.   Dg Chest 2 View  02/23/2012  *RADIOLOGY REPORT*  Clinical Data: Shortness of breath.  Generalized weakness.  CHEST - 2 VIEW  Comparison: Two-view chest x-ray 08/27/2011.  Findings: Cardiac silhouette  mildly enlarged but stable.  Thoracic aorta mildly tortuous and atherosclerotic, unchanged.  Hilar and mediastinal contours otherwise unremarkable.  Airspace consolidation in the medial right lower lobe.  Calcified granulomata in the left upper lobe, unchanged.  Lungs otherwise clear.  No pleural effusions.  Degenerative changes involving the thoracic spine.  IMPRESSION: Right lower lobe pneumonia.  Old granulomatous disease.  Stable mild cardiomegaly without pulmonary edema.  Original Report Authenticated By: Arnell Sieving, M.D.   Ct Head Wo  Contrast  02/23/2012  *RADIOLOGY REPORT*  Clinical Data: Weakness, dizziness and blurred vision.  CT HEAD WITHOUT CONTRAST  Technique:  Contiguous axial images were obtained from the base of the skull through the vertex without contrast.  Comparison: Head CT 08/27/2011.  Brain MRI 08/27/2011.  Findings: Again noted is cerebral and cerebellar atrophy.  There is a well defined region of low attenuation in the posterior left parietooccipital region, compatible with an area of encephalomalacia related to previously noted infarction.  No definite acute intracranial abnormality.  Specifically, no definite evidence of acute/subacute cerebral ischemia, no acute intracranial hemorrhage, no focal mass, mass effect, hydrocephalus or abnormal intra or extra-axial fluid collections.  There are patchy regions of subtle decreased attenuation throughout the deep and periventricular white matter of the cerebral hemispheres bilaterally, compatible with chronic microvascular ischemic changes.  No acute displaced skull fractures are identified. Visualized paranasal sinuses and mastoids are remarkable for extensive mucosal thickening throughout the ethmoids bilaterally, as well as the right maxillary sinus, with apparent thickening of the right turbinate (likely the superior turbinate).  IMPRESSION: 1.  No acute intracranial abnormalities. 2.  Moderate cerebral and cerebellar atrophy with chronic microvascular ischemic changes and area of encephalomalacia in the left parietooccipital region, similar to prior examinations. 3.  Chronic paranasal sinus disease, as detailed above, similar to the prior examination from 08/27/2011.  Original Report Authenticated By: Florencia Reasons, M.D.   Mr Brain Wo Contrast  02/23/2012  *RADIOLOGY REPORT*  Clinical Data: Dizzy and headache.  Generalized weakness.  MRI HEAD WITHOUT CONTRAST  Technique:  Multiplanar, multiecho pulse sequences of the brain and surrounding structures were obtained  according to standard protocol without intravenous contrast.  Comparison: CT head without contrast to 02/23/2012.  MRI brain without contrast 08/27/2011.  Findings: The diffusion weighted images demonstrate no evidence for acute or subacute infarction.  Moderate generalized atrophy is similar to the prior study.  White matter disease is predominately periventricular and without significant interval change.  White matter disease in the brainstem is stable.  No hemorrhage or mass lesion is present.  The ventricles are proportionate to the degree of atrophy.  No significant extra-axial fluid collection is present.  Abnormal signal on the right vertebral artery is compatible with chronic occlusion or high-grade stenosis.  Flow is otherwise present within the major intracranial arteries.  Right maxillary sinus disease is again noted.  There is a pedunculated mass which extends from the right nasal polyp.  This may represent a polyp.  Scattered opacification of ethmoid air cells is similar to the prior study.  Diffuse mucosal thickening in the frontal sinuses bilaterally is stable.  There is minimal fluid in the mastoid air cells.  No obstructing nasopharyngeal lesion is present.  IMPRESSION:  1.  No acute intracranial abnormality or significant interval change. 2.  Stable atrophy and white matter disease.  This likely reflects the sequelae of chronic microvascular ischemia. 3.  Acute and chronic sinus disease.  Original Report Authenticated By: Jamesetta Orleans. MATTERN, M.D.  Review of Systems  HENT: Negative.   Eyes: Negative.   Respiratory: Positive for cough, shortness of breath and wheezing.   Cardiovascular: Negative.   Gastrointestinal: Negative.   Genitourinary: Negative.   Musculoskeletal: Negative.   Skin: Negative.   Neurological: Positive for weakness.  Endo/Heme/Allergies: Negative.   Psychiatric/Behavioral: Negative.     Blood pressure 123/68, pulse 55, temperature 97.4 F (36.3 C),  temperature source Oral, resp. rate 19, SpO2 94.00%. Physical Exam  Constitutional: He is oriented to person, place, and time. He appears well-developed and well-nourished. No distress.  HENT:  Head: Normocephalic and atraumatic.  Right Ear: External ear normal.  Left Ear: External ear normal.  Nose: Nose normal.  Mouth/Throat: Oropharynx is clear and moist. No oropharyngeal exudate.  Eyes: Conjunctivae are normal. Pupils are equal, round, and reactive to light. Right eye exhibits no discharge. Left eye exhibits no discharge. No scleral icterus.  Neck: Normal range of motion. Neck supple.  Cardiovascular: Normal rate and regular rhythm.   Respiratory: Effort normal. No respiratory distress. He has wheezes. He has no rales.  GI: Soft. Bowel sounds are normal. He exhibits no distension. There is no tenderness. There is no rebound.  Musculoskeletal: Normal range of motion. He exhibits no edema and no tenderness.  Neurological: He is alert and oriented to person, place, and time.       Moves all extremities.  Skin: Skin is warm and dry. He is not diaphoretic. No erythema.  Psychiatric: His behavior is normal.     Assessment/Plan #1. Community acquired pneumonia - patient has been started on ceftriaxone and Zithromax which we will continue. Given his history of tobacco abuse chest x-ray has to be followed until complete resolution. #2. COPD exacerbation - place patient on IV Solu-Medrol, nebulizer and Pulmicort. #3. Generalized weakness probably from deconditioning - consult PT. MRI brain negative for anything acute. #4. CAD status post stenting - denies any chest pain. Cardiac enzymes negative. Continue present medications including Plavix. #5. History of CVA - continue Plavix. MRI brain negative. #6. Tobacco abuse - strongly advised to quit smoking.   Code Status - Full code.  Eduard Clos. 02/23/2012, 9:11 PM

## 2012-02-23 NOTE — ED Notes (Signed)
MD at bedside. 

## 2012-02-23 NOTE — ED Notes (Signed)
Pt remains in MRI 

## 2012-02-23 NOTE — ED Notes (Signed)
Pt to CT and MRI.

## 2012-02-23 NOTE — ED Notes (Signed)
Attempted to call report x1, told that RN would call me back soon

## 2012-02-23 NOTE — ED Notes (Addendum)
Pt in via EMS, pt in c/o weakness, family concerned about stroke, states they noted a right sided facial droop, bilateral leg weakness, for EMS equal grip strength, CBG 98, IV placed in LAC PTA, pt c/o bilateral leg pain rated 8/10, pt states he just felt bad this morning and felt weak all over, states he almost fell while walking, states he has been using a walker since his stroke but this morning felt weaker. C/o headache this morning but states he has had a headache every morning since his stroke and it did not feel different this morning. No facial droop noted at this time, equal movement in all extremities, c/o generalized weakness

## 2012-02-24 DIAGNOSIS — I2510 Atherosclerotic heart disease of native coronary artery without angina pectoris: Secondary | ICD-10-CM

## 2012-02-24 DIAGNOSIS — D696 Thrombocytopenia, unspecified: Secondary | ICD-10-CM

## 2012-02-24 DIAGNOSIS — J438 Other emphysema: Secondary | ICD-10-CM

## 2012-02-24 DIAGNOSIS — I10 Essential (primary) hypertension: Secondary | ICD-10-CM

## 2012-02-24 LAB — COMPREHENSIVE METABOLIC PANEL
ALT: 11 U/L (ref 0–53)
AST: 13 U/L (ref 0–37)
Alkaline Phosphatase: 78 U/L (ref 39–117)
CO2: 17 mEq/L — ABNORMAL LOW (ref 19–32)
Chloride: 104 mEq/L (ref 96–112)
GFR calc non Af Amer: 49 mL/min — ABNORMAL LOW (ref >90)
Potassium: 4.8 mEq/L (ref 3.5–5.1)
Sodium: 137 mEq/L (ref 135–145)
Total Bilirubin: 0.2 mg/dL — ABNORMAL LOW (ref 0.3–1.2)

## 2012-02-24 LAB — TSH: TSH: 1.026 u[IU]/mL (ref 0.350–4.500)

## 2012-02-24 LAB — CBC
MCH: 30.9 pg (ref 26.0–34.0)
MCHC: 32.3 g/dL (ref 30.0–36.0)
MCV: 95.6 fL (ref 78.0–100.0)
Platelets: 157 10*3/uL (ref 150–400)

## 2012-02-24 NOTE — Care Management Note (Signed)
    Page 1 of 1   02/24/2012     11:07:57 AM   CARE MANAGEMENT NOTE 02/24/2012  Patient:  Terry Flynn, Terry Flynn   Account Number:  [id number]  Date Initiated:  02/24/2012  Documentation initiated by:  GRAVES-BIGELOW,Ariely Riddell  Subjective/Objective Assessment:   Pt admitted with SOB and weakness. Confirmed RLL PNA. Pt is from home with son and they provide support. Per son pt has RW and 3n1. Pt has PT consult - No f/u needed just supervision.     Action/Plan:   No further needs from CM at this time.   Anticipated DC Date:  02/27/2012   Anticipated DC Plan:  HOME/SELF CARE      DC Planning Services  CM consult      Choice offered to / List presented to:             Status of service:  Completed, signed off Medicare Important Message given?   (If response is "NO", the following Medicare IM given date fields will be blank) Date Medicare IM given:   Date Additional Medicare IM given:    Discharge Disposition:  HOME/SELF CARE  Per UR Regulation:  Reviewed for med. necessity/level of care/duration of stay  If discussed at Long Length of Stay Meetings, dates discussed:    Comments:

## 2012-02-24 NOTE — Progress Notes (Signed)
UR Completed Tamea Bai Graves-Bigelow, RN,BSN [phone]  

## 2012-02-24 NOTE — Evaluation (Signed)
Physical Therapy Evaluation Patient Details Name: Terry Flynn MRN: [id number] DOB: 08-22-1933 Today's Date: 02/24/2012 Time: 7829-5621 PT Time Calculation (min): 23 min  PT Assessment / Plan / Recommendation Clinical Impression  Patient presents with community aquired pneumonia. He will benefit from acute PT to ensure he can reach modified indep. level of mobility to decrease his burden of care at discharge.     PT Assessment  Patient needs continued PT services    Follow Up Recommendations  No PT follow up;Supervision - Intermittent    Barriers to Discharge  None      lEquipment Recommendations  None recommended by PT    Recommendations for Other Services  None  Frequency Min 3X/week    Precautions / Restrictions Precautions Precautions: Fall   Pertinent Vitals/Pain VSS/no pain      Mobility  Bed Mobility Bed Mobility: Supine to Sit;Sitting - Scoot to [place] of Bed;Sit to Supine;Scooting to [facility] Supine to Sit: 7: Independent Sitting - Scoot to Edge of Bed: 7: Independent Sit to Supine: 7: Independent Scooting to HOB: 6: Modified independent (Device/Increase time) Transfers Transfers: Sit to Stand;Stand to Sit Sit to Stand: 5: Supervision;With upper extremity assist;From bed Stand to Sit: 5: Supervision;To bed;With upper extremity assist Details for Transfer Assistance: Questioning cues for correct hand placement as attempted to pull up on walker (? walker with brakes at home).  Ambulation/Gait Ambulation/Gait Assistance: 5: Supervision Ambulation Distance (Feet): 180 Feet Assistive device: Rolling walker Ambulation/Gait Assistance Details: Speed variable - easily distracted resulting in decr. speed Gait Pattern: Step-through pattern;Decreased stride length     PT Diagnosis: Difficulty walking  PT Problem List: Decreased mobility;Decreased knowledge of use of DME;Decreased activity tolerance;Decreased balance PT Treatment Interventions: DME instruction;Gait  training;Stair training;Therapeutic activities;Therapeutic exercise;Balance training;Patient/family education   PT Goals Acute Rehab PT Goals PT Goal Formulation: With patient Time For Goal Achievement: 03/02/12 Potential to Achieve Goals: Good Pt will go Sit to Stand: with modified independence;with upper extremity assist PT Goal: Sit to Stand - Progress: Goal set today Pt will go Stand to Sit: with modified independence;with upper extremity assist PT Goal: Stand to Sit - Progress: Goal set today Pt will Ambulate: >150 feet;with modified independence;with least restrictive assistive device PT Goal: Ambulate - Progress: Goal set today Pt will Go Up / Down Stairs: 3-5 stairs;with rail(s);with supervision PT Goal: Up/Down Stairs - Progress: Goal set today  Visit Information  Last PT Received On: 02/24/12 Assistance Needed: +1    Subjective Data  Subjective: Patient reports feeling much better Patient Stated Goal: Home today   Prior Functioning  Home Living Lives With: Family Available Help at Discharge: Family;Available 24 hours/day Type of Home: House Home Access: Stairs to enter [employer] of Steps: 3 Entrance Stairs-Rails: Right;Left;Can reach both Home Layout: One level Bathroom Shower/Tub: Engineer, manufacturing systems: Handicapped height Home Adaptive Equipment: Walker - rolling;Shower chair with back;Bedside commode/3-in-1;Walker - four wheeled Prior Function Level of Independence: Independent with assistive device(s) Able to Take Stairs?: Yes Driving: No (not currently) Vocation: Retired Musician: No difficulties    Cognition  Overall Cognitive Status: Impaired Area of Impairment: Memory Arousal/Alertness: Awake/alert Orientation Level: Appears intact for tasks assessed Behavior During Session: [facility] for tasks performed Memory Deficits: Changing story regarding his son and his relationships    Extremity/Trunk Assessment Right  Lower Extremity Assessment RLE ROM/Strength/Tone: WFL for tasks assessed RLE Sensation: WFL - Light Touch;WFL - Proprioception RLE Coordination: WFL - gross/fine motor Left Lower Extremity Assessment LLE ROM/Strength/Tone: [facility] for  tasks assessed LLE Sensation: WFL - Light Touch;WFL - Proprioception LLE Coordination: WFL - gross/fine motor Trunk Assessment Trunk Assessment: Normal   Balance Balance Balance Assessed: Yes Static Standing Balance Static Standing - Balance Support: Bilateral upper extremity supported Static Standing - Level of Assistance: 5: Stand by assistance High Level Balance High Level Balance Activites: Turns;Side stepping High Level Balance Comments: stand by assistance with walker  End of Session PT - End of Session Equipment Utilized During Treatment: Gait belt Activity Tolerance: Patient tolerated treatment well Patient left: in bed;with call bell/phone within reach;with bed alarm set Nurse Communication: Mobility status   Edwyna Perfect, PT  Pager [phone]  02/24/2012, 9:00 AM

## 2012-02-24 NOTE — Progress Notes (Signed)
[facility] Progress Note  02/24/2012  Subjective: Pt says he is already starting to feel better. No SOB, No CP.    Objective:  Vital signs in last 24 hours: Filed Vitals:   02/24/12 0006 02/24/12 0400 02/24/12 0744 02/24/12 0850  BP:  109/56 116/66   Pulse:  78 59   Temp:  97.6 F (36.4 C) 97.6 F (36.4 C)   TempSrc:  Oral Oral   Resp:  20 20   Height:      Weight:      SpO2: 95% 96% 91% 92%   Weight change:   Intake/Output Summary (Last 24 hours) at 02/24/12 0981 Last data filed at 02/24/12 0400  Gross per 24 hour  Intake      0 ml  Output    400 ml  Net   -400 ml   Lab Results  Component Value Date   HGBA1C 5.9* 08/28/2011   Lab Results  Component Value Date   LDLCALC 74 08/28/2011   CREATININE 1.35 02/24/2012    Review of Systems As above, otherwise all reviewed and reported negative  Physical Exam General - awake, no distress, cooperative HEENT - NCAT, MMM Lungs - BBS, tight with rare ant exp wheezes heard CV - normal s1, s2 sounds Abd - obese, soft, nondistended, no masses, nontender Ext - no C/C/E  Lab Results: Results for orders placed during the hospital encounter of 02/23/12 (from the past 24 hour(s))  CBC     Status: Normal   Collection Time   02/23/12  4:09 PM      Component Value Range   WBC 9.1  4.0 - 10.5 (K/uL)   RBC 4.62  4.22 - 5.81 (MIL/uL)   Hemoglobin 14.4  13.0 - 17.0 (g/dL)   HCT 19.1  47.8 - 29.5 (%)   MCV 95.9  78.0 - 100.0 (fL)   MCH 31.2  26.0 - 34.0 (pg)   MCHC 32.5  30.0 - 36.0 (g/dL)   RDW 62.1  30.8 - 65.7 (%)   Platelets 154  150 - 400 (K/uL)  DIFFERENTIAL     Status: Normal   Collection Time   02/23/12  4:09 PM      Component Value Range   Neutrophils Relative 58  43 - 77 (%)   Neutro Abs 5.3  1.7 - 7.7 (K/uL)   Lymphocytes Relative 29  12 - 46 (%)   Lymphs Abs 2.6  0.7 - 4.0 (K/uL)   Monocytes Relative 9  3 - 12 (%)   Monocytes Absolute 0.8  0.1 - 1.0 (K/uL)   Eosinophils Relative 4  0 - 5 (%)   Eosinophils  Absolute 0.3  0.0 - 0.7 (K/uL)   Basophils Relative 0  0 - 1 (%)   Basophils Absolute 0.0  0.0 - 0.1 (K/uL)  COMPREHENSIVE METABOLIC PANEL     Status: Abnormal   Collection Time   02/23/12  4:09 PM      Component Value Range   Sodium 138  135 - 145 (mEq/L)   Potassium 4.8  3.5 - 5.1 (mEq/L)   Chloride 105  96 - 112 (mEq/L)   CO2 23  19 - 32 (mEq/L)   Glucose, Bld 77  70 - 99 (mg/dL)   BUN 29 (*) 6 - 23 (mg/dL)   Creatinine, Ser 8.46 (*) 0.50 - 1.35 (mg/dL)   Calcium 9.1  8.4 - 96.2 (mg/dL)   Total Protein 7.1  6.0 - 8.3 (g/dL)   Albumin 3.4 (*)  3.5 - 5.2 (g/dL)   AST 11  0 - 37 (U/L)   ALT 11  0 - 53 (U/L)   Alkaline Phosphatase 79  39 - 117 (U/L)   Total Bilirubin 0.1 (*) 0.3 - 1.2 (mg/dL)   GFR calc non Af Amer 45 (*) >90 (mL/min)   GFR calc Af Amer 53 (*) >90 (mL/min)  PROTIME-INR     Status: Normal   Collection Time   02/23/12  4:09 PM      Component Value Range   Prothrombin Time 12.4  11.6 - 15.2 (seconds)   INR 0.91  0.00 - 1.49   CARDIAC PANEL(CRET KIN+CKTOT+MB+TROPI)     Status: Normal   Collection Time   02/23/12  4:10 PM      Component Value Range   Total CK 26  7 - 232 (U/L)   CK, MB 1.3  0.3 - 4.0 (ng/mL)   Troponin I <0.30  <0.30 (ng/mL)   Relative Index RELATIVE INDEX IS INVALID  0.0 - 2.5   URINALYSIS, ROUTINE W REFLEX MICROSCOPIC     Status: Abnormal   Collection Time   02/23/12  6:28 PM      Component Value Range   Color, Urine YELLOW  YELLOW    APPearance CLEAR  CLEAR    Specific Gravity, Urine 1.025  1.005 - 1.030    pH 5.0  5.0 - 8.0    Glucose, UA NEGATIVE  NEGATIVE (mg/dL)   Hgb urine dipstick NEGATIVE  NEGATIVE    Bilirubin Urine SMALL (*) NEGATIVE    Ketones, ur NEGATIVE  NEGATIVE (mg/dL)   Protein, ur NEGATIVE  NEGATIVE (mg/dL)   Urobilinogen, UA 0.2  0.0 - 1.0 (mg/dL)   Nitrite NEGATIVE  NEGATIVE    Leukocytes, UA NEGATIVE  NEGATIVE   CBC     Status: Abnormal   Collection Time   02/23/12 10:15 PM      Component Value Range   WBC 8.4  4.0 -  10.5 (K/uL)   RBC 4.51  4.22 - 5.81 (MIL/uL)   Hemoglobin 14.1  13.0 - 17.0 (g/dL)   HCT 91.4  78.2 - 95.6 (%)   MCV 95.1  78.0 - 100.0 (fL)   MCH 31.3  26.0 - 34.0 (pg)   MCHC 32.9  30.0 - 36.0 (g/dL)   RDW 21.3  08.6 - 57.8 (%)   Platelets 142 (*) 150 - 400 (K/uL)  CREATININE, SERUM     Status: Abnormal   Collection Time   02/23/12 10:15 PM      Component Value Range   Creatinine, Ser 1.38 (*) 0.50 - 1.35 (mg/dL)   GFR calc non Af Amer 48 (*) >90 (mL/min)   GFR calc Af Amer 55 (*) >90 (mL/min)  COMPREHENSIVE METABOLIC PANEL     Status: Abnormal   Collection Time   02/24/12  7:30 AM      Component Value Range   Sodium 137  135 - 145 (mEq/L)   Potassium 4.8  3.5 - 5.1 (mEq/L)   Chloride 104  96 - 112 (mEq/L)   CO2 17 (*) 19 - 32 (mEq/L)   Glucose, Bld 154 (*) 70 - 99 (mg/dL)   BUN 28 (*) 6 - 23 (mg/dL)   Creatinine, Ser 4.69  0.50 - 1.35 (mg/dL)   Calcium 9.1  8.4 - 62.9 (mg/dL)   Total Protein 7.3  6.0 - 8.3 (g/dL)   Albumin 3.4 (*) 3.5 - 5.2 (g/dL)   AST 13  0 -  37 (U/L)   ALT 11  0 - 53 (U/L)   Alkaline Phosphatase 78  39 - 117 (U/L)   Total Bilirubin 0.2 (*) 0.3 - 1.2 (mg/dL)   GFR calc non Af Amer 49 (*) >90 (mL/min)   GFR calc Af Amer 57 (*) >90 (mL/min)  CBC     Status: Normal   Collection Time   02/24/12  7:30 AM      Component Value Range   WBC 7.7  4.0 - 10.5 (K/uL)   RBC 4.73  4.22 - 5.81 (MIL/uL)   Hemoglobin 14.6  13.0 - 17.0 (g/dL)   HCT 16.1  09.6 - 04.5 (%)   MCV 95.6  78.0 - 100.0 (fL)   MCH 30.9  26.0 - 34.0 (pg)   MCHC 32.3  30.0 - 36.0 (g/dL)   RDW 40.9  81.1 - 91.4 (%)   Platelets 157  150 - 400 (K/uL)    Micro Results: No results found for this or any previous visit (from the past 240 hour(s)).  Medications:  Scheduled Meds:   . albuterol  2.5 mg Nebulization Q6H  . amLODipine  10 mg Oral Daily  . azithromycin  500 mg Intravenous Once  . azithromycin  500 mg Intravenous Q24H  . budesonide  0.5 mg Nebulization BID  . cefTRIAXone (ROCEPHIN)   IV  1 g Intravenous Once  . cefTRIAXone (ROCEPHIN)  IV  1 g Intravenous Q24H  . clopidogrel  75 mg Oral Daily  . enoxaparin  40 mg Subcutaneous Daily  . ipratropium  0.5 mg Nebulization Q6H  . irbesartan  300 mg Oral Daily  . methylPREDNISolone (SOLU-MEDROL) injection  40 mg Intravenous Daily  . metoprolol succinate  25 mg Oral Q1200  . nicotine  21 mg Transdermal Once  . pantoprazole  40 mg Oral Q1200  . PARoxetine  20 mg Oral Daily  . sodium chloride  3 mL Intravenous Q12H  . sodium chloride  3 mL Intravenous Q12H  . DISCONTD: sodium chloride   Intravenous STAT  . DISCONTD: Febuxostat  40-80 mg Oral Daily  . DISCONTD: olmesartan  40 mg Oral Daily   Continuous Infusions:  PRN Meds:.acetaminophen, acetaminophen, albuterol, febuxostat, LORazepam, ondansetron (ZOFRAN) IV, ondansetron, DISCONTD: ondansetron (ZOFRAN) IV  Assessment/Plan: Community Acquired Pneumonia - clinically improving, continue ceftriazone, azithromycin IV  COPD w/acute exacerbation - clinically improving, continue current management  Hypertension - blood pressure well controlled on current home meds  Generalized Weakness - Pt did well with PT this morning.    Tobacco abuse - encouraged cessation, consulted counselor  CAD s/p stenting - continue plavix   Chronic Renal Insufficiency - stable, monitoring   History of CVA - MRI - no acute CVA seen    LOS: 1 day   Ivory Bail 02/24/2012, 9:07 AM  Cleora Fleet, MD, CDE, FAAFP [facility] [place], [place]  [phone]

## 2012-02-25 DIAGNOSIS — I2510 Atherosclerotic heart disease of native coronary artery without angina pectoris: Secondary | ICD-10-CM

## 2012-02-25 DIAGNOSIS — I10 Essential (primary) hypertension: Secondary | ICD-10-CM

## 2012-02-25 DIAGNOSIS — D696 Thrombocytopenia, unspecified: Secondary | ICD-10-CM

## 2012-02-25 DIAGNOSIS — J438 Other emphysema: Secondary | ICD-10-CM

## 2012-02-25 LAB — BASIC METABOLIC PANEL
CO2: 21 mEq/L (ref 19–32)
Calcium: 9.2 mg/dL (ref 8.4–10.5)
Chloride: 103 mEq/L (ref 96–112)
Creatinine, Ser: 1.3 mg/dL (ref 0.50–1.35)
Glucose, Bld: 92 mg/dL (ref 70–99)
Sodium: 136 mEq/L (ref 135–145)

## 2012-02-25 MED ORDER — NICOTINE 14 MG/24HR TD PT24
28.0000 mg | MEDICATED_PATCH | Freq: Every day | TRANSDERMAL | Status: DC
  Administered 2012-02-25 – 2012-02-26 (×2): 28 mg via TRANSDERMAL
  Filled 2012-02-25 (×2): qty 2

## 2012-02-25 MED ORDER — AZITHROMYCIN 600 MG PO TABS
600.0000 mg | ORAL_TABLET | Freq: Every day | ORAL | Status: DC
  Administered 2012-02-25: 600 mg via ORAL
  Filled 2012-02-25 (×4): qty 1

## 2012-02-25 MED ORDER — NICOTINE 14 MG/24HR TD PT24: 28.0000 mg | MEDICATED_PATCH | Freq: Every day | TRANSDERMAL | Status: DC

## 2012-02-25 MED ORDER — IPRATROPIUM BROMIDE 0.02 % IN SOLN
0.5000 mg | Freq: Three times a day (TID) | RESPIRATORY_TRACT | Status: DC
  Administered 2012-02-25 – 2012-02-26 (×4): 0.5 mg via RESPIRATORY_TRACT
  Filled 2012-02-25 (×4): qty 2.5

## 2012-02-25 MED ORDER — ALBUTEROL SULFATE (5 MG/ML) 0.5% IN NEBU
2.5000 mg | INHALATION_SOLUTION | Freq: Three times a day (TID) | RESPIRATORY_TRACT | Status: DC
  Administered 2012-02-25 – 2012-02-26 (×4): 2.5 mg via RESPIRATORY_TRACT
  Filled 2012-02-25 (×4): qty 0.5

## 2012-02-25 MED ORDER — NICOTINE 14 MG/24HR TD PT24
14.0000 mg | MEDICATED_PATCH | Freq: Every day | TRANSDERMAL | Status: DC
  Filled 2012-02-25: qty 1
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
## 2012-02-25 NOTE — Progress Notes (Signed)
[facility] Progress Note  02/25/2012   Subjective: Pt says he feels better today than yesterday. Still coughing a lot.  He has been smoking 2 ppd at home and 21 mg nicotine patch wasn't helping.    Objective:  Vital signs in last 24 hours: Filed Vitals:   02/25/12 0400 02/25/12 0800 02/25/12 0824 02/25/12 1226  BP: 127/72 147/74  148/88  Pulse: 61 62  58  Temp: 97.4 F (36.3 C) 97.7 F (36.5 C)    TempSrc: Oral Oral    Resp: 20 18    Height:      Weight:      SpO2: 93% 94% 93%    Weight change:   Intake/Output Summary (Last 24 hours) at 02/25/12 1319 Last data filed at 02/25/12 1130  Gross per 24 hour  Intake    306 ml  Output    176 ml  Net    130 ml   Lab Results  Component Value Date   HGBA1C 5.9* 08/28/2011   Lab Results  Component Value Date   LDLCALC 74 08/28/2011   CREATININE 1.30 02/25/2012    Review of Systems As above, otherwise all reviewed and reported negative  Physical Exam General - awake, no distress, cooperative  HEENT - NCAT, MMM Lungs - BBS, tight with rare ant exp wheezes heard  CV - normal s1, s2 sounds  Abd - obese, soft, nondistended, no masses, nontender  Ext - no C/C/E  Lab Results: Results for orders placed during the hospital encounter of 02/23/12 (from the past 24 hour(s))  BASIC METABOLIC PANEL     Status: Abnormal   Collection Time   02/25/12  5:55 AM      Component Value Range   Sodium 136  135 - 145 (mEq/L)   Potassium 5.0  3.5 - 5.1 (mEq/L)   Chloride 103  96 - 112 (mEq/L)   CO2 21  19 - 32 (mEq/L)   Glucose, Bld 92  70 - 99 (mg/dL)   BUN 32 (*) 6 - 23 (mg/dL)   Creatinine, Ser 4.54  0.50 - 1.35 (mg/dL)   Calcium 9.2  8.4 - 09.8 (mg/dL)   GFR calc non Af Amer 51 (*) >90 (mL/min)   GFR calc Af Amer 59 (*) >90 (mL/min)    Micro Results: No results found for this or any previous visit (from the past 240 hour(s)).  Medications:  Scheduled Meds:   . albuterol  2.5 mg Nebulization TID  . amLODipine  10 mg Oral  Daily  . azithromycin  600 mg Oral QHS  . budesonide  0.5 mg Nebulization BID  . cefTRIAXone (ROCEPHIN)  IV  1 g Intravenous Q24H  . clopidogrel  75 mg Oral Daily  . enoxaparin  40 mg Subcutaneous Daily  . ipratropium  0.5 mg Nebulization TID  . irbesartan  300 mg Oral Daily  . methylPREDNISolone (SOLU-MEDROL) injection  40 mg Intravenous Daily  . metoprolol succinate  25 mg Oral Q1200  . nicotine  14 mg Transdermal Daily  . nicotine  21 mg Transdermal Once  . pantoprazole  40 mg Oral Q1200  . PARoxetine  20 mg Oral Daily  . sodium chloride  3 mL Intravenous Q12H  . sodium chloride  3 mL Intravenous Q12H  . DISCONTD: albuterol  2.5 mg Nebulization Q6H  . DISCONTD: azithromycin  500 mg Intravenous Q24H  . DISCONTD: ipratropium  0.5 mg Nebulization Q6H   Continuous Infusions:  PRN Meds:.acetaminophen, acetaminophen, albuterol, febuxostat, LORazepam, ondansetron (  ZOFRAN) IV, ondansetron  Assessment/Plan: Community Acquired Pneumonia - clinically improving, continue ceftriazone, azithromycin, plan home tomorrow.    COPD w/acute exacerbation - clinically improving, continue current management   Hypertension - blood pressure well controlled on current home meds   Generalized Weakness - Pt did well with PT this morning.   Tobacco abuse - encouraged cessation, consulted counselor, Ordered 2 - 14 mg patches daily as pt smokes 2 ppd.   CAD s/p stenting - continue plavix   Chronic Renal Insufficiency - stable, monitoring   History of CVA - MRI - no acute CVA seen    LOS: 2 days   Terry Flynn 02/25/2012, 1:19 PM   Cleora Fleet, MD, CDE, FAAFP [facility] [place], [place]  [phone]

## 2012-02-26 DIAGNOSIS — D696 Thrombocytopenia, unspecified: Secondary | ICD-10-CM

## 2012-02-26 DIAGNOSIS — J438 Other emphysema: Secondary | ICD-10-CM

## 2012-02-26 DIAGNOSIS — I10 Essential (primary) hypertension: Secondary | ICD-10-CM

## 2012-02-26 DIAGNOSIS — I2510 Atherosclerotic heart disease of native coronary artery without angina pectoris: Secondary | ICD-10-CM

## 2012-02-26 MED ORDER — NICOTINE 14 MG/24HR TD PT24: 2.0000 | MEDICATED_PATCH | Freq: Every day | TRANSDERMAL | Status: DC

## 2012-02-26 MED ORDER — LEVOFLOXACIN 500 MG PO TABS: 500.0000 mg | ORAL_TABLET | Freq: Every day | ORAL | Status: AC

## 2012-02-26 MED ORDER — ALBUTEROL SULFATE HFA 108 (90 BASE) MCG/ACT IN AERS: 2.0000 | INHALATION_SPRAY | RESPIRATORY_TRACT | Status: DC | PRN

## 2012-02-26 MED ORDER — PREDNISONE 20 MG PO TABS: 20.0000 mg | ORAL_TABLET | Freq: Every day | ORAL | Status: DC

## 2012-02-26 NOTE — Discharge Summary (Signed)
Physician Discharge Summary  Patient ID: Terry Flynn MRN: [id number] DOB/AGE: Aug 31, 1933 78 y.o.  Admit date: 02/23/2012 Discharge date: 02/26/2012  Admission Diagnoses: Community Acquired Pneumonia COPD Exacerbation  Discharge Diagnoses:   *Community acquired pneumonia  TOBACCO ABUSE  CORONARY ARTERY DISEASE  CVA (cerebral infarction)  COPD exacerbation  Dementia  Discharged Condition: good  Hospital Course:  Community Acquired Pneumonia - clinically improving after 3 days of IV antibiotics, pt to be discharged home on levaquin.   COPD w/acute exacerbation - clinically improving, continue home inhalers and steroids for another 7 days, follow up with PCP  Hypertension - blood pressure well controlled on current home meds   Generalized Weakness - improved.  PT evaluated patient and recommended no follow up PT but intermittent supervision at home  Tobacco abuse - pt is a heavy tobacco user 2 ppd, encouraged cessation, consulted counselor to speak with patient, Ordered two 14 mg patches to be worn daily with prescription given at discharge.  Pt counseled to not smoke while wearing nicotine patches and no smoking inside the home advised.   CAD s/p stenting - negative cardiac enzymes, continue plavix  Chronic Renal Insufficiency - creatinine stable at 1.30 GFR 51 at discharge  History of CVA - MRI was obtained - no acute CVA seen   Discharge Exam: Pt says that he is breathing better,  He is eating well and ambulating well.  No CP or SOB.   Blood pressure 128/71, pulse 62, temperature 97 F (36.1 C), temperature source Oral, resp. rate 16, height 5\' 7"  (1.702 m), weight 94.121 kg (207 lb 8 oz), SpO2 93.00%. General - awake, no distress, cooperative  HEENT - NCAT, MMM Lungs - BBS with better air movement, no wheezing heard  CV - normal s1, s2 sounds  Abd - obese, soft, nondistended, no masses, nontender  Ext - no C/C/E  Disposition: 06-Home-Health Care Svc    Discharge  Orders    Future Appointments: Provider: Department: Dept Phone: Center:   03/08/2012 10:15 AM Terry Stade, MD [facility] [phone] [id number]     Future Orders Please Complete By Expires   Increase activity slowly      Discharge instructions      Comments:   Please stop smoking cigarettes and using nicotine or tobacco No smoking in the house  Follow up with physician as directed Please don't smoke if wearing a nicotine patch      Medication List  As of 02/26/2012 10:05 AM   TAKE these medications         acetaminophen 500 MG tablet   Commonly known as: TYLENOL   Take 1,000 mg by mouth every 6 (six) hours as needed.      albuterol 108 (90 BASE) MCG/ACT inhaler   Commonly known as: PROVENTIL HFA;VENTOLIN HFA   Inhale 2 puffs into the lungs every 4 (four) hours as needed for wheezing or shortness of breath. For breathing      amLODipine 10 MG tablet   Commonly known as: NORVASC   Take 10 mg by mouth daily.      clopidogrel 75 MG tablet   Commonly known as: PLAVIX   Take 75 mg by mouth daily.      levofloxacin 500 MG tablet   Commonly known as: LEVAQUIN   Take 1 tablet (500 mg total) by mouth daily.      LORazepam 2 MG tablet   Commonly known as: ATIVAN   Take 2 mg by mouth every 8 (eight) hours  as needed. For anxiety      metoprolol succinate 25 MG 24 hr tablet   Commonly known as: TOPROL-XL   Take 25 mg by mouth daily.      nicotine 14 mg/24hr patch   Commonly known as: NICODERM CQ - dosed in mg/24 hours   Place 2 patches onto the skin daily.      PARoxetine 20 MG tablet   Commonly known as: PAXIL   Take 20 mg by mouth every morning.      predniSONE 20 MG tablet   Commonly known as: DELTASONE   Take 1 tablet (20 mg total) by mouth daily.      tiotropium 18 MCG inhalation capsule   Commonly known as: SPIRIVA   Place 1 capsule (18 mcg total) into inhaler and inhale daily.      ULORIC 80 MG Tabs   Generic drug: Febuxostat   Take 40-80 mg by  mouth daily. Can take 40-80 mg depending on the pain      valsartan 320 MG tablet   Commonly known as: DIOVAN   Take 320 mg by mouth daily.           Follow-up Information    Follow up with Terry Heap, MD in 1 week. [facility] Follow Up for Pneumonia and COPD)    Contact information:   [address] [place] [address] [phone]          I spent 39 mins preparing discharge, reviewing records, labs, imaging, counseling patient, etc.     Signed: Monnie Flynn 02/26/2012, 10:05 AM

## 2012-02-26 NOTE — Discharge Instructions (Signed)
Pneumonia, Adult Pneumonia is an infection of the lungs.  CAUSES Pneumonia may be caused by bacteria or a virus. Usually, these infections are caused by breathing infectious particles into the lungs (respiratory tract). SYMPTOMS   Cough.   Fever.   Chest pain.   Increased rate of breathing.   Wheezing.   Mucus production.  DIAGNOSIS  If you have the common symptoms of pneumonia, your caregiver will typically confirm the diagnosis with a chest X-ray. The X-ray will show an abnormality in the lung (pulmonary infiltrate) if you have pneumonia. Other tests of your blood, urine, or sputum may be done to find the specific cause of your pneumonia. Your caregiver may also do tests (blood gases or pulse oximetry) to see how well your lungs are working. TREATMENT  Some forms of pneumonia may be spread to other people when you cough or sneeze. You may be asked to wear a mask before and during your exam. Pneumonia that is caused by bacteria is treated with antibiotic medicine. Pneumonia that is caused by the influenza virus may be treated with an antiviral medicine. Most other viral infections must run their course. These infections will not respond to antibiotics.  PREVENTION A pneumococcal shot (vaccine) is available to prevent a common bacterial cause of pneumonia. This is usually suggested for:  People over 65 years old.   Patients on chemotherapy.   People with chronic lung problems, such as bronchitis or emphysema.   People with immune system problems.  If you are over 65 or have a high risk condition, you may receive the pneumococcal vaccine if you have not received it before. In some countries, a routine influenza vaccine is also recommended. This vaccine can help prevent some cases of pneumonia.You may be offered the influenza vaccine as part of your care. If you smoke, it is time to quit. You may receive instructions on how to stop smoking. Your caregiver can provide medicines and  counseling to help you quit. HOME CARE INSTRUCTIONS   Cough suppressants may be used if you are losing too much rest. However, coughing protects you by clearing your lungs. You should avoid using cough suppressants if you can.   Your caregiver may have prescribed medicine if he or she thinks your pneumonia is caused by a bacteria or influenza. Finish your medicine even if you start to feel better.   Your caregiver may also prescribe an expectorant. This loosens the mucus to be coughed up.   Only take over-the-counter or prescription medicines for pain, discomfort, or fever as directed by your caregiver.   Do not smoke. Smoking is a common cause of bronchitis and can contribute to pneumonia. If you are a smoker and continue to smoke, your cough may last several weeks after your pneumonia has cleared.   A cold steam vaporizer or humidifier in your room or home may help loosen mucus.   Coughing is often worse at night. Sleeping in a semi-upright position in a recliner or using a couple pillows under your head will help with this.   Get rest as you feel it is needed. Your body will usually let you know when you need to rest.  SEEK IMMEDIATE MEDICAL CARE IF:   Your illness becomes worse. This is especially true if you are elderly or weakened from any other disease.   You cannot control your cough with suppressants and are losing sleep.   You begin coughing up blood.   You develop pain which is getting worse or   is uncontrolled with medicines.   You have a fever.   Any of the symptoms which initially brought you in for treatment are getting worse rather than better.   You develop shortness of breath or chest pain.  MAKE SURE YOU:   Understand these instructions.   Will watch your condition.   Will get help right away if you are not doing well or get worse.  Document Released: 09/06/2005 Document Revised: 08/26/2011 Document Reviewed: 11/26/2010 [facility] Patient Information 2012  [place], [place].   Chronic Obstructive Pulmonary Disease Chronic obstructive pulmonary disease (COPD) is a lung disease. The lungs become damaged, making it hard to get air in and out of your lungs. The damage to your lungs cannot be changed.  HOME CARE  Stop smoking if you smoke. Avoid secondhand smoke.   Only take medicine as told by your doctor.   Talk to your doctor about using cough syrup or over-the-counter medicines.   Drink enough fluids to keep your pee (urine) clear or pale yellow.   Use a humidifier or vaporizer. This may help loosen the thick spit (mucus).   Talk to your doctor about vaccines that help prevent other lung problems (pneumonia and flu vaccines).   Use home oxygen as told by your doctor.   Stay active and exercise.   Eat healthy foods.  GET HELP RIGHT AWAY IF:   Your heart is beating fast.   You become disturbed, confused, shake, or are dazed.   You have trouble breathing.   You have chest pain.   You have a fever.   You cough up thick spit that is yellowish-white or green.   Your breathing becomes worse when you exercise.   You are running out of the medicine you take for your breathing.  MAKE SURE YOU:   Understand these instructions.   Will watch your condition.   Will get help right away if you are not doing well or get worse.  Document Released: 02/23/2008 Document Revised: 08/26/2011 Document Reviewed: 11/06/2010 [facility] Patient Information 2012 [place], [place].   Smoking Cessation, Tips for Success YOU CAN QUIT SMOKING If you are ready to quit smoking, congratulations! You have chosen to help yourself be healthier. Cigarettes bring nicotine, tar, carbon monoxide, and other irritants into your body. Your lungs, heart, and blood vessels will be able to work better without these poisons. There are many different ways to quit smoking. Nicotine gum, nicotine patches, a nicotine inhaler, or nicotine nasal spray can help with physical  craving. Hypnosis, support groups, and medicines help break the habit of smoking. Here are some tips to help you quit for good.  Throw away all cigarettes.   Clean and remove all ashtrays from your home, work, and car.   On a card, write down your reasons for quitting. Carry the card with you and read it when you get the urge to smoke.   Cleanse your body of nicotine. Drink enough water and fluids to keep your urine clear or pale yellow. Do this after quitting to flush the nicotine from your body.   Learn to predict your moods. Do not let a bad situation be your excuse to have a cigarette. Some situations in your life might tempt you into wanting a cigarette.   Never have "just one" cigarette. It leads to wanting another and another. Remind yourself of your decision to quit.   Change habits associated with smoking. If you smoked while driving or when feeling stressed, try other activities to  replace smoking. Stand up when drinking your coffee. Brush your teeth after eating. Sit in a different chair when you read the paper. Avoid alcohol while trying to quit, and try to drink fewer caffeinated beverages. Alcohol and caffeine may urge you to smoke.   Avoid foods and drinks that can trigger a desire to smoke, such as sugary or spicy foods and alcohol.   Ask people who smoke not to smoke around you.   Have something planned to do right after eating or having a cup of coffee. Take a walk or exercise to perk you up. This will help to keep you from overeating.   Try a relaxation exercise to calm you down and decrease your stress. Remember, you may be tense and nervous for the first 2 weeks after you quit, but this will pass.   Find new activities to keep your hands busy. Play with a pen, coin, or rubber band. Doodle or draw things on paper.   Brush your teeth right after eating. This will help cut down on the craving for the taste of tobacco after meals. You can try mouthwash, too.   Use oral  substitutes, such as lemon drops, carrots, a cinnamon stick, or chewing gum, in place of cigarettes. Keep them handy so they are available when you have the urge to smoke.   When you have the urge to smoke, try deep breathing.   Designate your home as a nonsmoking area.   If you are a heavy smoker, ask your caregiver about a prescription for nicotine chewing gum. It can ease your withdrawal from nicotine.   Reward yourself. Set aside the cigarette money you save and buy yourself something nice.   Look for support from others. Join a support group or smoking cessation program. Ask someone at home or at work to help you with your plan to quit smoking.   Always ask yourself, "Do I need this cigarette or is this just a reflex?" Tell yourself, "Today, I choose not to smoke," or "I do not want to smoke." You are reminding yourself of your decision to quit, even if you do smoke a cigarette.  HOW WILL I FEEL WHEN I QUIT SMOKING?  The benefits of not smoking start within days of quitting.   You may have symptoms of withdrawal because your body is used to nicotine (the addictive substance in cigarettes). You may crave cigarettes, be irritable, feel very hungry, cough often, get headaches, or have difficulty concentrating.   The withdrawal symptoms are only temporary. They are strongest when you first quit but will go away within 10 to 14 days.   When withdrawal symptoms occur, stay in control. Think about your reasons for quitting. Remind yourself that these are signs that your body is healing and getting used to being without cigarettes.   Remember that withdrawal symptoms are easier to treat than the major diseases that smoking can cause.   Even after the withdrawal is over, expect periodic urges to smoke. However, these cravings are generally short-lived and will go away whether you smoke or not. Do not smoke!   If you relapse and smoke again, do not lose hope. Most smokers quit 3 times before  they are successful.   If you relapse, do not give up! Plan ahead and think about what you will do the next time you get the urge to smoke.  LIFE AS A NONSMOKER: MAKE IT FOR A MONTH, MAKE IT FOR LIFE Day 1: Hang this page  where you will see it every day. Day 2: Get rid of all ashtrays, matches, and lighters. Day 3: Drink water. Breathe deeply between sips. Day 4: Avoid places with smoke-filled air, such as bars, clubs, or the smoking section of restaurants. Day 5: Keep track of how much money you save by not smoking. Day 6: Avoid boredom. Keep a good book with you or go to the movies. Day 7: Reward yourself! One week without smoking! Day 8: Make a dental appointment to get your teeth cleaned. Day 9: Decide how you will turn down a cigarette before it is offered to you. Day 10: Review your reasons for quitting. Day 11: Distract yourself. Stay active to keep your mind off smoking and to relieve tension. Take a walk, exercise, read a book, do a crossword puzzle, or try a new hobby. Day 12: Exercise. Get off the bus before your stop or use stairs instead of escalators. Day 13: Call on friends for support and encouragement. Day 14: Reward yourself! Two weeks without smoking! Day 15: Practice deep breathing exercises. Day 16: Bet a friend that you can stay a nonsmoker. Day 17: Ask to sit in nonsmoking sections of restaurants. Day 18: Hang up "No Smoking" signs. Day 19: Think of yourself as a nonsmoker. Day 20: Each morning, tell yourself you will not smoke. Day 21: Reward yourself! Three weeks without smoking! Day 22: Think of smoking in negative ways. Remember how it stains your teeth, gives you bad breath, and leaves you short of breath. Day 23: Eat a nutritious breakfast. Day 24:Do not relive your days as a smoker. Day 25: Hold a pencil in your hand when talking on the telephone. Day 26: Tell all your friends you do not smoke. Day 27: Think about how much better food tastes. Day 28:  Remember, one cigarette is one too many. Day 29: Take up a hobby that will keep your hands busy. Day 30: Congratulations! One month without smoking! Give yourself a big reward. Your caregiver can direct you to community resources or hospitals for support, which may include:  Group support.   Education.   Hypnosis.   Subliminal therapy.  Document Released: 06/04/2004 Document Revised: 08/26/2011 Document Reviewed: 06/23/2009 [facility] Patient Information 2012 [place], [place].  Smoking Cessation This document explains the best ways for you to quit smoking and new treatments to help. It lists new medicines that can double or triple your chances of quitting and quitting for good. It also considers ways to avoid relapses and concerns you may have about quitting, including weight gain. NICOTINE: A POWERFUL ADDICTION If you have tried to quit smoking, you know how hard it can be. It is hard because nicotine is a very addictive drug. For some people, it can be as addictive as heroin or cocaine. Usually, people make 2 or 3 tries, or more, before finally being able to quit. Each time you try to quit, you can learn about what helps and what hurts. Quitting takes hard work and a lot of effort, but you can quit smoking. QUITTING SMOKING IS ONE OF THE MOST IMPORTANT THINGS YOU WILL EVER DO.  You will live longer, feel better, and live better.   The impact on your body of quitting smoking is felt almost immediately:   Within 20 minutes, blood pressure decreases. Pulse returns to its normal level.   After 8 hours, carbon monoxide levels in the blood return to normal. Oxygen level increases.   After 24 hours, chance of heart attack  starts to decrease. Breath, hair, and body stop smelling like smoke.   After 48 hours, damaged nerve endings begin to recover. Sense of taste and smell improve.   After 72 hours, the body is virtually free of nicotine. Bronchial tubes relax and breathing becomes easier.    After 2 to 12 weeks, lungs can hold more air. Exercise becomes easier and circulation improves.   Quitting will reduce your risk of having a heart attack, stroke, cancer, or lung disease:   After 1 year, the risk of coronary heart disease is cut in half.   After 5 years, the risk of stroke falls to the same as a nonsmoker.   After 10 years, the risk of lung cancer is cut in half and the risk of other cancers decreases significantly.   After 15 years, the risk of coronary heart disease drops, usually to the level of a nonsmoker.   If you are pregnant, quitting smoking will improve your chances of having a healthy baby.   The people you live with, especially your children, will be healthier.   You will have extra money to spend on things other than cigarettes.  FIVE KEYS TO QUITTING Studies have shown that these 5 steps will help you quit smoking and quit for good. You have the best chances of quitting if you use them together: 1. Get ready.  2. Get support and encouragement.  3. Learn new skills and behaviors.  4. Get medicine to reduce your nicotine addiction and use it correctly.  5. Be prepared for relapse or difficult situations. Be determined to continue trying to quit, even if you do not succeed at first.  1. GET READY  Set a quit date.   Change your environment.   Get rid of ALL cigarettes, ashtrays, matches, and lighters in your home, car, and place of work.   Do not let people smoke in your home.   Review your past attempts to quit. Think about what worked and what did not.   Once you quit, do not smoke. NOT EVEN A PUFF!  2. GET SUPPORT AND ENCOURAGEMENT Studies have shown that you have a better chance of being successful if you have help. You can get support in many ways.  Tell your family, friends, and coworkers that you are going to quit and need their support. Ask them not to smoke around you.   Talk to your caregivers (doctor, dentist, nurse, pharmacist,  psychologist, and/or smoking counselor).   Get individual, group, or telephone counseling and support. The more counseling you have, the better your chances are of quitting. Programs are available at [employer] and health centers. Call your local health department for information about programs in your area.   Spiritual beliefs and practices may help some smokers quit.   Quit meters are Photographer that keep track of quit statistics, such as amount of "quit-time," cigarettes not smoked, and money saved.   Many smokers find one or more of the many self-help books available useful in helping them quit and stay off tobacco.  3. LEARN NEW SKILLS AND BEHAVIORS  Try to distract yourself from urges to smoke. Talk to someone, go for a walk, or occupy your time with a task.   When you first try to quit, change your routine. Take a different route to work. Drink tea instead of coffee. Eat breakfast in a different place.   Do something to reduce your stress. Take a hot bath, exercise,  or read a book.   Plan something enjoyable to do every day. Reward yourself for not smoking.   Explore interactive web-based programs that specialize in helping you quit.  4. GET MEDICINE AND USE IT CORRECTLY Medicines can help you stop smoking and decrease the urge to smoke. Combining medicine with the above behavioral methods and support can quadruple your chances of successfully quitting smoking. The [place] Food and Drug Administration (FDA) has approved 7 medicines to help you quit smoking. These medicines fall into 3 categories.  Nicotine replacement therapy (delivers nicotine to your body without the negative effects and risks of smoking):   Nicotine gum: Available over-the-counter.   Nicotine lozenges: Available over-the-counter.   Nicotine inhaler: Available by prescription.   Nicotine nasal spray: Available by prescription.   Nicotine skin patches (transdermal):  Available by prescription and over-the-counter.   Antidepressant medicine (helps people abstain from smoking, but how this works is unknown):   Bupropion sustained-release (SR) tablets: Available by prescription.   Nicotinic receptor partial agonist (simulates the effect of nicotine in your brain):   Varenicline tartrate tablets: Available by prescription.   Ask your caregiver for advice about which medicines to use and how to use them. Carefully read the information on the package.   Everyone who is trying to quit may benefit from using a medicine. If you are pregnant or trying to become pregnant, nursing an infant, you are under age 14, or you smoke fewer than 10 cigarettes per day, talk to your caregiver before taking any nicotine replacement medicines.   You should stop using a nicotine replacement product and call your caregiver if you experience nausea, dizziness, weakness, vomiting, fast or irregular heartbeat, mouth problems with the lozenge or gum, or redness or swelling of the skin around the patch that does not go away.   Do not use any other product containing nicotine while using a nicotine replacement product.   Talk to your caregiver before using these products if you have diabetes, heart disease, asthma, stomach ulcers, you had a recent heart attack, you have high blood pressure that is not controlled with medicine, a history of irregular heartbeat, or you have been prescribed medicine to help you quit smoking.  5. BE PREPARED FOR RELAPSE OR DIFFICULT SITUATIONS  Most relapses occur within the first 3 months after quitting. Do not be discouraged if you start smoking again. Remember, most people try several times before they finally quit.   You may have symptoms of withdrawal because your body is used to nicotine. You may crave cigarettes, be irritable, feel very hungry, cough often, get headaches, or have difficulty concentrating.   The withdrawal symptoms are only temporary.  They are strongest when you first quit, but they will go away within 10 to 14 days.  Here are some difficult situations to watch for:  Alcohol. Avoid drinking alcohol. Drinking lowers your chances of successfully quitting.   Caffeine. Try to reduce the amount of caffeine you consume. It also lowers your chances of successfully quitting.   Other smokers. Being around smoking can make you want to smoke. Avoid smokers.   Weight gain. Many smokers will gain weight when they quit, usually less than 10 pounds. Eat a healthy diet and stay active. Do not let weight gain distract you from your main goal, quitting smoking. Some medicines that help you quit smoking may also help delay weight gain. You can always lose the weight gained after you quit.   Bad mood or depression. There  are a lot of ways to improve your mood other than smoking.  If you are having problems with any of these situations, talk to your caregiver. SPECIAL SITUATIONS AND CONDITIONS Studies suggest that everyone can quit smoking. Your situation or condition can give you a special reason to quit.  Pregnant women/new mothers: By quitting, you protect your baby's health and your own.   Hospitalized patients: By quitting, you reduce health problems and help healing.   Heart attack patients: By quitting, you reduce your risk of a second heart attack.   Lung, head, and neck cancer patients: By quitting, you reduce your chance of a second cancer.   Parents of children and adolescents: By quitting, you protect your children from illnesses caused by secondhand smoke.  QUESTIONS TO THINK ABOUT Think about the following questions before you try to stop smoking. You may want to talk about your answers with your caregiver.  Why do you want to quit?   If you tried to quit in the past, what helped and what did not?   What will be the most difficult situations for you after you quit? How will you plan to handle them?   Who can help you  through the tough times? Your family? Friends? Caregiver?   What pleasures do you get from smoking? What ways can you still get pleasure if you quit?  Here are some questions to ask your caregiver:  How can you help me to be successful at quitting?   What medicine do you think would be best for me and how should I take it?   What should I do if I need more help?   What is smoking withdrawal like? How can I get information on withdrawal?  Quitting takes hard work and a lot of effort, but you can quit smoking. FOR MORE INFORMATION  Smokefree.gov (http://www.davis-sullivan.com/) provides free, accurate, evidence-based information and professional assistance to help support the immediate and long-term needs of people trying to quit smoking. Document Released: 08/31/2001 Document Revised: 08/26/2011 Document Reviewed: 06/23/2009 [facility] Patient Information 2012 [place], [place].  RETURN IF SYMPTOMS RECUR, WORSEN OR NEW PROBLEMS DEVELOP.  Alcohol and Headaches Alcohol is a chemical known as ethanol. It is found in beverages such as beer, wine and liquor. Greater amounts are often found in liquor such as whiskey, vodka, scotch, mixed drinks and others. These drinks can also contain chemicals called congeners. Both ethanol and the congeners can effect how the person feels after drinking it. These "after effects" are often referred to as a hangover. Hangovers are rare with moderate alcohol drinking (1 to 3 average drinks). However, hangovers increase when the amount of alcohol consumed is more than moderate. SYMPTOMS  A hangover can be accompanied by:   Headache.   Upset stomach.   Nausea and vomiting.   Dehydration.  A hangover is actually a withdrawal state from moderate to heavy alcohol consumption. The recovery process will take longer if you attempt to relieve this withdrawal with more alcohol. Drinking caffeine may relieve some of the fatigue associated with a hangover. However, this can cause  more stomach irritation. Caffeine also makes a person urinate more (diuretic) and can worsen dehydration. TREATMENT  There are a few actions that can reduce a hangover's severity and length.  Drink 1 to 2 glasses of water (16 to 24 ounces) after you have quit drinking alcohol.   Use pain medicines carefully and as told by your caregiver. For a headache, avoid acetaminophen. This drug is hard  on the liver. Take aspirin instead and drink more water. If aspirin causes more stomach upset, ibuprofen may be a second choice.   Get rest.   Avoid high-fat foods and consider eating bananas (restores potassium and magnesium that will help both your stomach and headache). Oranges, apples and pears are good choices too.   Take vitamins. Alcohol depletes the body's stores of vitamins A, B (especially B6) and C, which can intensify hangover symptoms.   Drink 16 ounces of water each hour. This will rehydrate your body and make you feel better. Sports drinks containing electrolytes may help your body rehydrate quicker than drinking water alone. The faster you restore proper fluid balance, the sooner you will feel better. Hydration is vital when treating a hangover.   Exercise. As soon as you feel up to it, sweating helps remove toxins from the body faster.  SEEK MEDICAL CARE IF:   Hangovers become more frequent.   Hangovers interfere with major life activities such as job, family, health and relationships.   You are unable to control your drinking, professional help may be needed. Contact your physician for a referral to specialized treatment.  SEEK IMMEDIATE MEDICAL CARE IF:   You throw up blood.   You pass dark or tarry stools.   Your headache worsens over the next 24 hours instead getting better.   Your abdominal or stomach pain worsens instead of getting better over the next 24 hours.  Document Released: 09/09/2003 Document Revised: 08/26/2011 Document Reviewed: 04/24/2008 Exit[employer] Patient  Information 2012 ExitCare, [place]re and Headaches Alcohol is a chemical known as ethanol. It is found in beverages such as beer, wine and liquor. Greater amounts are often found in liquor such as whiskey, vodka, scotch, mixed drinks and others. These drinks can also contain chemicals called congeners. Both ethanol and the congeners can effect how the person feels after drinking it. These "after effects" are often referred to as a hangover. Hangovers are rare with moderate alcohol drinking (1 to 3 average drinks). However, hangovers increase when the amount of alcohol consumed is more than moderate. SYMPTOMS  A hangover can be accompanied by:   Headache.   Upset stomach.   Nausea and vomiting.   Dehydration.  A hangover is actually a withdrawal state from moderate to heavy alcohol consumption. The recovery process will take longer if you attempt to relieve this withdrawal with more alcohol. Drinking caffeine may relieve some of the fatigue associated with a hangover. However, this can cause more stomach irritation. Caffeine also makes a person urinate more (diuretic) and can worsen dehydration. TREATMENT  There are a few actions that can reduce a hangover's severity and length.  Drink 1 to 2 glasses of water (16 to 24 ounces) after you have quit drinking alcohol.   Use pain medicines carefully and as told by your caregiver. For a headache, avoid acetaminophen. This drug is hard on the liver. Take aspirin instead and drink more water. If aspirin causes more stomach upset, ibuprofen may be a second choice.   Get rest.   Avoid high-fat foods and consider eating bananas (restores potassium and magnesium that will help both your stomach and headache). Oranges, apples and pears are good choices too.   Take vitamins. Alcohol depletes the body's stores of vitamins A, B (especially B6) and C, which can intensify hangover symptoms.   Drink 16 ounces of water each hour. This will rehydrate your body  and make you feel better. Sports drinks containing electrolytes may help your  body rehydrate quicker than drinking water alone. The faster you restore proper fluid balance, the sooner you will feel better. Hydration is vital when treating a hangover.   Exercise. As soon as you feel up to it, sweating helps remove toxins from the body faster.  SEEK MEDICAL CARE IF:   Hangovers become more frequent.   Hangovers interfere with major life activities such as job, family, health and relationships.   You are unable to control your drinking, professional help may be needed. Contact your physician for a referral to specialized treatment.  SEEK IMMEDIATE MEDICAL CARE IF:   You throw up blood.   You pass dark or tarry stools.   Your headache worsens over the next 24 hours instead getting better.   Your abdominal or stomach pain worsens instead of getting better over the next 24 hours.  Document Released: 09/09/2003 Document Revised: 08/26/2011 Document Reviewed: 04/24/2008 [facility] Patient Information 2012 [place], [place].Alcohol and Headaches Alcohol is a chemical known as ethanol. It is found in beverages such as beer, wine and liquor. Greater amounts are often found in liquor such as whiskey, vodka, scotch, mixed drinks and others. These drinks can also contain chemicals called congeners. Both ethanol and the congeners can effect how the person feels after drinking it. These "after effects" are often referred to as a hangover. Hangovers are rare with moderate alcohol drinking (1 to 3 average drinks). However, hangovers increase when the amount of alcohol consumed is more than moderate. SYMPTOMS  A hangover can be accompanied by:   Headache.   Upset stomach.   Nausea and vomiting.   Dehydration.  A hangover is actually a withdrawal state from moderate to heavy alcohol consumption. The recovery process will take longer if you attempt to relieve this withdrawal with more alcohol. Drinking  caffeine may relieve some of the fatigue associated with a hangover. However, this can cause more stomach irritation. Caffeine also makes a person urinate more (diuretic) and can worsen dehydration. TREATMENT  There are a few actions that can reduce a hangover's severity and length.  Drink 1 to 2 glasses of water (16 to 24 ounces) after you have quit drinking alcohol.   Use pain medicines carefully and as told by your caregiver. For a headache, avoid acetaminophen. This drug is hard on the liver. Take aspirin instead and drink more water. If aspirin causes more stomach upset, ibuprofen may be a second choice.   Get rest.   Avoid high-fat foods and consider eating bananas (restores potassium and magnesium that will help both your stomach and headache). Oranges, apples and pears are good choices too.   Take vitamins. Alcohol depletes the body's stores of vitamins A, B (especially B6) and C, which can intensify hangover symptoms.   Drink 16 ounces of water each hour. This will rehydrate your body and make you feel better. Sports drinks containing electrolytes may help your body rehydrate quicker than drinking water alone. The faster you restore proper fluid balance, the sooner you will feel better. Hydration is vital when treating a hangover.   Exercise. As soon as you feel up to it, sweating helps remove toxins from the body faster.  SEEK MEDICAL CARE IF:   Hangovers become more frequent.   Hangovers interfere with major life activities such as job, family, health and relationships.   You are unable to control your drinking, professional help may be needed. Contact your physician for a referral to specialized treatment.  SEEK IMMEDIATE MEDICAL CARE IF:   You  throw up blood.   You pass dark or tarry stools.   Your headache worsens over the next 24 hours instead getting better.   Your abdominal or stomach pain worsens instead of getting better over the next 24 hours.  Document Released:  09/09/2003 Document Revised: 08/26/2011 Document Reviewed: 04/24/2008 [facility] Patient Information 2012 [employer].Alcohol and Headaches Alcohol is a chemical known as ethanol. It is found in beverages such as beer, wine and liquor. Greater amounts are often found in liquor such as whiskey, vodka, scotch, mixed drinks and others. These drinks can also contain chemicals called congeners. Both ethanol and the congeners can effect how the person feels after drinking it. These "after effects" are often referred to as a hangover. Hangovers are rare with moderate alcohol drinking (1 to 3 average drinks). However, hangovers increase when the amount of alcohol consumed is more than moderate. SYMPTOMS  A hangover can be accompanied by:   Headache.   Upset stomach.   Nausea and vomiting.   Dehydration.  A hangover is actually a withdrawal state from moderate to heavy alcohol consumption. The recovery process will take longer if you attempt to relieve this withdrawal with more alcohol. Drinking caffeine may relieve some of the fatigue associated with a hangover. However, this can cause more stomach irritation. Caffeine also makes a person urinate more (diuretic) and can worsen dehydration. TREATMENT  There are a few actions that can reduce a hangover's severity and length.  Drink 1 to 2 glasses of water (16 to 24 ounces) after you have quit drinking alcohol.   Use pain medicines carefully and as told by your caregiver. For a headache, avoid acetaminophen. This drug is hard on the liver. Take aspirin instead and drink more water. If aspirin causes more stomach upset, ibuprofen may be a second choice.   Get rest.   Avoid high-fat foods and consider eating bananas (restores potassium and magnesium that will help both your stomach and headache). Oranges, apples and pears are good choices too.   Take vitamins. Alcohol depletes the body's stores of vitamins A, B (especially B6) and C, which can  intensify hangover symptoms.   Drink 16 ounces of water each hour. This will rehydrate your body and make you feel better. Sports drinks containing electrolytes may help your body rehydrate quicker than drinking water alone. The faster you restore proper fluid balance, the sooner you will feel better. Hydration is vital when treating a hangover.   Exercise. As soon as you feel up to it, sweating helps remove toxins from the body faster.  SEEK MEDICAL CARE IF:   Hangovers become more frequent.   Hangovers interfere with major life activities such as job, family, health and relationships.   You are unable to control your drinking, professional help may be needed. Contact your physician for a referral to specialized treatment.  SEEK IMMEDIATE MEDICAL CARE IF:   You throw up blood.   You pass dark or tarry stools.   Your headache worsens over the next 24 hours instead getting better.   Your abdominal or stomach pain worsens instead of getting better over the next 24 hours.  Document Released: 09/09/2003 Document Revised: 08/26/2011 Document Reviewed: 04/24/2008 [facility] Patient Information 2012 [place], [place].

## 2012-03-05 ENCOUNTER — Other Ambulatory Visit: Payer: Self-pay | Admitting: Internal Medicine

## 2012-03-06 NOTE — Telephone Encounter (Signed)
Pharmacy aware of reson denial.

## 2012-03-08 ENCOUNTER — Ambulatory Visit: Payer: Medicare Other | Admitting: Cardiovascular Disease

## 2012-03-08 ENCOUNTER — Other Ambulatory Visit: Payer: Self-pay | Admitting: Internal Medicine

## 2012-03-08 NOTE — Telephone Encounter (Signed)
Not pt in our clinic 

## 2012-03-20 DIAGNOSIS — I639 Cerebral infarction, unspecified: Secondary | ICD-10-CM

## 2012-03-20 HISTORY — DX: Cerebral infarction, unspecified: I63.9

## 2012-03-24 ENCOUNTER — Inpatient Hospital Stay (HOSPITAL_COMMUNITY): Payer: Medicare Other

## 2012-03-24 ENCOUNTER — Inpatient Hospital Stay (HOSPITAL_COMMUNITY)
Admission: EM | Admit: 2012-03-24 | Discharge: 2012-03-28 | DRG: 683 | Disposition: A | Discharge status: Skilled Nursing Facility | Payer: Medicare Other | Attending: Internal Medicine | Admitting: Internal Medicine

## 2012-03-24 ENCOUNTER — Encounter (HOSPITAL_COMMUNITY): Payer: Self-pay

## 2012-03-24 DIAGNOSIS — F03918 Unspecified dementia, unspecified severity, with other behavioral disturbance: Secondary | ICD-10-CM | POA: Diagnosis present

## 2012-03-24 DIAGNOSIS — N189 Chronic kidney disease, unspecified: Secondary | ICD-10-CM | POA: Diagnosis present

## 2012-03-24 DIAGNOSIS — E860 Dehydration: Secondary | ICD-10-CM | POA: Diagnosis present

## 2012-03-24 DIAGNOSIS — F0390 Unspecified dementia without behavioral disturbance: Secondary | ICD-10-CM

## 2012-03-24 DIAGNOSIS — E875 Hyperkalemia: Secondary | ICD-10-CM

## 2012-03-24 DIAGNOSIS — E785 Hyperlipidemia, unspecified: Secondary | ICD-10-CM | POA: Diagnosis present

## 2012-03-24 DIAGNOSIS — F0391 Unspecified dementia with behavioral disturbance: Secondary | ICD-10-CM | POA: Diagnosis present

## 2012-03-24 DIAGNOSIS — I129 Hypertensive chronic kidney disease with stage 1 through stage 4 chronic kidney disease, or unspecified chronic kidney disease: Secondary | ICD-10-CM | POA: Diagnosis present

## 2012-03-24 DIAGNOSIS — M1990 Unspecified osteoarthritis, unspecified site: Secondary | ICD-10-CM | POA: Diagnosis present

## 2012-03-24 DIAGNOSIS — J449 Chronic obstructive pulmonary disease, unspecified: Secondary | ICD-10-CM | POA: Diagnosis present

## 2012-03-24 DIAGNOSIS — N179 Acute kidney failure, unspecified: Secondary | ICD-10-CM

## 2012-03-24 DIAGNOSIS — E872 Acidosis, unspecified: Secondary | ICD-10-CM | POA: Diagnosis present

## 2012-03-24 DIAGNOSIS — F3289 Other specified depressive episodes: Secondary | ICD-10-CM | POA: Diagnosis present

## 2012-03-24 DIAGNOSIS — R296 Repeated falls: Secondary | ICD-10-CM

## 2012-03-24 DIAGNOSIS — J4489 Other specified chronic obstructive pulmonary disease: Secondary | ICD-10-CM | POA: Diagnosis present

## 2012-03-24 DIAGNOSIS — Z66 Do not resuscitate: Secondary | ICD-10-CM | POA: Diagnosis present

## 2012-03-24 DIAGNOSIS — I2510 Atherosclerotic heart disease of native coronary artery without angina pectoris: Secondary | ICD-10-CM

## 2012-03-24 DIAGNOSIS — R339 Retention of urine, unspecified: Secondary | ICD-10-CM | POA: Diagnosis present

## 2012-03-24 DIAGNOSIS — K921 Melena: Secondary | ICD-10-CM | POA: Diagnosis present

## 2012-03-24 DIAGNOSIS — F329 Major depressive disorder, single episode, unspecified: Secondary | ICD-10-CM | POA: Diagnosis present

## 2012-03-24 DIAGNOSIS — Z8673 Personal history of transient ischemic attack (TIA), and cerebral infarction without residual deficits: Secondary | ICD-10-CM

## 2012-03-24 DIAGNOSIS — Z79899 Other long term (current) drug therapy: Secondary | ICD-10-CM

## 2012-03-24 DIAGNOSIS — D649 Anemia, unspecified: Secondary | ICD-10-CM | POA: Diagnosis present

## 2012-03-24 DIAGNOSIS — F419 Anxiety disorder, unspecified: Secondary | ICD-10-CM

## 2012-03-24 DIAGNOSIS — F17200 Nicotine dependence, unspecified, uncomplicated: Secondary | ICD-10-CM | POA: Diagnosis present

## 2012-03-24 DIAGNOSIS — F411 Generalized anxiety disorder: Secondary | ICD-10-CM | POA: Diagnosis present

## 2012-03-24 DIAGNOSIS — K219 Gastro-esophageal reflux disease without esophagitis: Secondary | ICD-10-CM

## 2012-03-24 DIAGNOSIS — N19 Unspecified kidney failure: Secondary | ICD-10-CM

## 2012-03-24 LAB — URINALYSIS, ROUTINE W REFLEX MICROSCOPIC
Glucose, UA: NEGATIVE mg/dL
Ketones, ur: NEGATIVE mg/dL
Leukocytes, UA: NEGATIVE
Nitrite: NEGATIVE
Specific Gravity, Urine: 1.02 (ref 1.005–1.030)
pH: 5.5 (ref 5.0–8.0)

## 2012-03-24 LAB — BASIC METABOLIC PANEL
BUN: 73 mg/dL — ABNORMAL HIGH (ref 6–23)
BUN: 63 mg/dL — ABNORMAL HIGH (ref 6–23)
CO2: 16 mEq/L — ABNORMAL LOW (ref 19–32)
CO2: 13 mEq/L — ABNORMAL LOW (ref 19–32)
Calcium: 8.3 mg/dL — ABNORMAL LOW (ref 8.4–10.5)
Chloride: 106 mEq/L (ref 96–112)
Chloride: 116 mEq/L — ABNORMAL HIGH (ref 96–112)
Creatinine, Ser: 3.81 mg/dL — ABNORMAL HIGH (ref 0.50–1.35)
Creatinine, Ser: 3.02 mg/dL — ABNORMAL HIGH (ref 0.50–1.35)
Potassium: 6.2 mEq/L — ABNORMAL HIGH (ref 3.5–5.1)

## 2012-03-24 LAB — HEPATIC FUNCTION PANEL
AST: 38 U/L — ABNORMAL HIGH (ref 0–37)
Albumin: 3.2 g/dL — ABNORMAL LOW (ref 3.5–5.2)
Bilirubin, Direct: <0.1 mg/dL (ref 0.0–0.3)
Total Bilirubin: 0.1 mg/dL — ABNORMAL LOW (ref 0.3–1.2)

## 2012-03-24 LAB — CBC
HCT: 38.4 % — ABNORMAL LOW (ref 39.0–52.0)
MCV: 92.3 fL (ref 78.0–100.0)
RBC: 4.16 MIL/uL — ABNORMAL LOW (ref 4.22–5.81)
WBC: 5 10*3/uL (ref 4.0–10.5)

## 2012-03-24 MED ORDER — PAROXETINE HCL 30 MG PO TABS
30.0000 mg | ORAL_TABLET | Freq: Every day | ORAL | Status: DC
  Administered 2012-03-25 – 2012-03-28 (×4): 30 mg via ORAL
  Filled 2012-03-24 (×6): qty 1

## 2012-03-24 MED ORDER — ALBUTEROL SULFATE HFA 108 (90 BASE) MCG/ACT IN AERS
2.0000 | INHALATION_SPRAY | RESPIRATORY_TRACT | Status: DC | PRN
  Administered 2012-03-28: 2 via RESPIRATORY_TRACT
  Filled 2012-03-24 (×2): qty 6.7

## 2012-03-24 MED ORDER — FEBUXOSTAT 40 MG PO TABS
40.0000 mg | ORAL_TABLET | Freq: Every day | ORAL | Status: DC
  Administered 2012-03-24 – 2012-03-25 (×3): 40 mg via ORAL
  Administered 2012-03-26: 80 mg via ORAL
  Administered 2012-03-27 – 2012-03-28 (×2): 40 mg via ORAL
  Filled 2012-03-24 (×5): qty 2

## 2012-03-24 MED ORDER — NICOTINE 21 MG/24HR TD PT24
21.0000 mg | MEDICATED_PATCH | Freq: Every day | TRANSDERMAL | Status: DC
Start: 2012-03-24 — End: 2012-03-28
  Administered 2012-03-24 – 2012-03-28 (×5): 21 mg via TRANSDERMAL
  Filled 2012-03-24 (×5): qty 1

## 2012-03-24 MED ORDER — SODIUM CHLORIDE 0.9 % IV SOLN
INTRAVENOUS | Status: DC
  Administered 2012-03-24 – 2012-03-25 (×2): via INTRAVENOUS

## 2012-03-24 MED ORDER — ENOXAPARIN SODIUM 30 MG/0.3ML ~~LOC~~ SOLN
30.0000 mg | SUBCUTANEOUS | Status: DC
  Administered 2012-03-24 – 2012-03-25 (×2): 30 mg via SUBCUTANEOUS
  Filled 2012-03-24 (×3): qty 0.3

## 2012-03-24 MED ORDER — CLOPIDOGREL BISULFATE 75 MG PO TABS
75.0000 mg | ORAL_TABLET | Freq: Every day | ORAL | Status: DC
  Administered 2012-03-24 – 2012-03-26 (×3): 75 mg via ORAL
  Filled 2012-03-24 (×3): qty 1

## 2012-03-24 MED ORDER — PNEUMOCOCCAL VAC POLYVALENT 25 MCG/0.5ML IJ INJ
0.5000 mL | INJECTION | INTRAMUSCULAR | Status: AC
  Administered 2012-03-25: 0.5 mL via INTRAMUSCULAR
  Filled 2012-03-24: qty 0.5

## 2012-03-24 MED ORDER — TRAMADOL HCL 50 MG PO TABS: 50.0000 mg | ORAL_TABLET | Freq: Four times a day (QID) | ORAL | Status: DC | PRN

## 2012-03-24 MED ORDER — HALOPERIDOL LACTATE 5 MG/ML IJ SOLN
5.0000 mg | Freq: Four times a day (QID) | INTRAMUSCULAR | Status: DC | PRN
  Administered 2012-03-24 – 2012-03-25 (×2): 5 mg via INTRAVENOUS
  Filled 2012-03-24: qty 1

## 2012-03-24 MED ORDER — SODIUM POLYSTYRENE SULFONATE 15 GM/60ML PO SUSP
30.0000 g | Freq: Once | ORAL | Status: AC
  Administered 2012-03-24: 30 g via ORAL
  Filled 2012-03-24: qty 120

## 2012-03-24 MED ORDER — ONDANSETRON HCL 4 MG PO TABS: 4.0000 mg | ORAL_TABLET | Freq: Four times a day (QID) | ORAL | Status: DC | PRN

## 2012-03-24 MED ORDER — POLYETHYLENE GLYCOL 3350 17 G PO PACK
17.0000 g | PACK | Freq: Every day | ORAL | Status: DC | PRN
  Filled 2012-03-24: qty 1

## 2012-03-24 MED ORDER — LORAZEPAM 1 MG PO TABS
2.0000 mg | ORAL_TABLET | Freq: Three times a day (TID) | ORAL | Status: DC | PRN
  Administered 2012-03-24 – 2012-03-25 (×3): 2 mg via ORAL
  Filled 2012-03-24 (×3): qty 2

## 2012-03-24 MED ORDER — ACETAMINOPHEN 650 MG RE SUPP: 650.0000 mg | Freq: Four times a day (QID) | RECTAL | Status: DC | PRN

## 2012-03-24 MED ORDER — ACETAMINOPHEN 325 MG PO TABS
650.0000 mg | ORAL_TABLET | Freq: Four times a day (QID) | ORAL | Status: DC | PRN
  Administered 2012-03-25 – 2012-03-28 (×3): 650 mg via ORAL
  Filled 2012-03-24 (×3): qty 2

## 2012-03-24 MED ORDER — SODIUM POLYSTYRENE SULFONATE 15 GM/60ML PO SUSP
15.0000 g | Freq: Once | ORAL | Status: AC
  Administered 2012-03-24: 15 g via ORAL
  Filled 2012-03-24: qty 60

## 2012-03-24 MED ORDER — SODIUM CHLORIDE 0.9 % IV SOLN
INTRAVENOUS | Status: AC
  Administered 2012-03-24: 10:00:00 via INTRAVENOUS

## 2012-03-24 MED ORDER — SODIUM CHLORIDE 0.9 % IV BOLUS (SEPSIS)
1000.0000 mL | Freq: Once | INTRAVENOUS | Status: AC
  Administered 2012-03-24: 1000 mL via INTRAVENOUS

## 2012-03-24 MED ORDER — ONDANSETRON HCL 4 MG/2ML IJ SOLN: 4.0000 mg | Freq: Four times a day (QID) | INTRAMUSCULAR | Status: DC | PRN

## 2012-03-24 NOTE — ED Notes (Signed)
ZOX:[id number]<AV> Expected date:03/24/12<BR> Expected time: 4:07 AM<BR> Means of arrival:Ambulance<BR> Comments:<BR> Weakness; elderly

## 2012-03-24 NOTE — Progress Notes (Signed)
OT Note:  Unable to keep pt aroused for OT eval.  Will check back another day.  [place], OTR/L [phone] 03/24/2012

## 2012-03-24 NOTE — Progress Notes (Signed)
Pt resting intermittently but wakes and removes tele and gown still.  Cont to monitor. Genia Harold

## 2012-03-24 NOTE — ED Provider Notes (Signed)
History     CSN: [id number]  Arrival date & time 03/24/12  0427   First MD Initiated Contact with Patient 03/24/12 [phone]      Chief Complaint  Patient presents with  . Dementia    (Consider location/radiation/quality/duration/timing/severity/associated sxs/prior treatment) HPI 78 year old male presents emergency apartment via EMS with his family with complaint of weakness. They report over the last 4 days patient has had increasing weakness along with decreased food and water intake. Patient with history of dementia, previous MIs, CVA, and recent admission for pneumonia about a month ago. Family denies any focal weakness, only generalized weakness. He normally uses a walker, but he has required several family members help to get around. No fevers no chills no worsening cough no change in bowel or bladder habits reported.    Past Medical History  Diagnosis Date  . Back pain     DJD  . HTN (hypertension)   . CAD (coronary artery disease)     stent to RCA March 11, 1996 and 03/12/2003  . COPD (chronic obstructive pulmonary disease)   . GERD (gastroesophageal reflux disease)   . Headache     chronic, worse over last month  . Chronic renal insufficiency     baseline Cr around 1.4  . Depression 1996    since wife passed away  . Anxiety 1996    since wife passed away  . Confusion 2010/03/11    progressive since 2010-03-11, question of dementia  . HLD (hyperlipidemia)   . Stroke     Past Surgical History  Procedure Date  . Coronary stent placement 1997    RCA  . Coronary stent placement 12-Mar-2003    in-stent restenosis, stent replaced RCA.      Family History  Problem Relation Age of Onset  . Throat cancer Father     Smoker  . Heart disease Brother   . Asthma Son   . Heart disease Brother     History  Substance Use Topics  . Smoking status: Current Some Day Smoker -- 1.0 packs/day for 50 years    Types: Cigarettes  . Smokeless tobacco: Not on file  . Alcohol Use: Yes     very occasional       Review of Systems  Unable to perform ROS: Dementia    Allergies  Statins  Home Medications   Current Outpatient Rx  Name Route Sig Dispense Refill  . ACETAMINOPHEN 500 MG PO TABS Oral Take 1,000 mg by mouth every 6 (six) hours as needed.    . ALBUTEROL SULFATE HFA 108 (90 BASE) MCG/ACT IN AERS Inhalation Inhale 2 puffs into the lungs every 4 (four) hours as needed for wheezing or shortness of breath. For breathing 1 Inhaler 1  . AMLODIPINE BESYLATE 10 MG PO TABS Oral Take 10 mg by mouth daily.      Marland Kitchen CLOPIDOGREL BISULFATE 75 MG PO TABS Oral Take 75 mg by mouth daily.    . FEBUXOSTAT 80 MG PO TABS Oral Take 40-80 mg by mouth daily. Can take 40-80 mg depending on the pain, if pain is severe he will take 80mg     . LORAZEPAM 2 MG PO TABS Oral Take 2 mg by mouth every 8 (eight) hours as needed. For anxiety    . METOPROLOL SUCCINATE ER 25 MG PO TB24 Oral Take 25 mg by mouth daily.      Marland Kitchen PAROXETINE HCL 20 MG PO TABS Oral Take 30 mg by mouth every morning.     Marland Kitchen  SULFAMETHOXAZOLE-TMP DS 800-160 MG PO TABS Oral Take 1 tablet by mouth 2 (two) times daily.    . TRAMADOL HCL 50 MG PO TABS Oral Take 50 mg by mouth every 6 (six) hours as needed. For pain    . VALSARTAN 320 MG PO TABS Oral Take 320 mg by mouth daily.        BP 108/66  Pulse 74  Temp 98.7 F (37.1 C) (Oral)  Resp 24  SpO2 98%  Physical Exam  Nursing note and vitals reviewed. Constitutional: He is oriented to person, place, and time. He appears well-developed and well-nourished.  HENT:  Head: Normocephalic and atraumatic.  Nose: Nose normal.  Mouth/Throat: Oropharynx is clear and moist.  Neck: Normal range of motion. Neck supple. No JVD present. No tracheal deviation present. No thyromegaly present.  Cardiovascular: Normal rate, regular rhythm, normal heart sounds and intact distal pulses.  Exam reveals no gallop and no friction rub.   No murmur heard. Pulmonary/Chest: Effort normal and breath sounds normal. No  stridor. No respiratory distress. He has no wheezes. He has no rales. He exhibits no tenderness.  Abdominal: Soft. Bowel sounds are normal. He exhibits no distension and no mass. There is no tenderness. There is no rebound and no guarding.  Musculoskeletal: Normal range of motion. He exhibits no edema and no tenderness.  Lymphadenopathy:    He has no cervical adenopathy.  Neurological: He is alert and oriented to person, place, and time. He has normal reflexes. No cranial nerve deficit. He exhibits normal muscle tone. Coordination normal.  Skin: Skin is warm and dry. No rash noted. No erythema. No pallor.    ED Course  Procedures (including critical care time)  Labs Reviewed  CBC - Abnormal; Notable for the following:    RBC 4.16 (*)     Hemoglobin 12.6 (*)     HCT 38.4 (*)     All other components within normal limits  BASIC METABOLIC PANEL - Abnormal; Notable for the following:    Sodium 134 (*)     Potassium 6.2 (*)     CO2 16 (*)     BUN 73 (*)     Creatinine, Ser 3.81 (*)     GFR calc non Af Amer 14 (*)     GFR calc Af Amer 16 (*)     All other components within normal limits  URINALYSIS, ROUTINE W REFLEX MICROSCOPIC - Abnormal; Notable for the following:    APPearance CLOUDY (*)     Bilirubin Urine SMALL (*)     All other components within normal limits   No results found.   Date: 03/24/2012  Rate: 89  Rhythm: normal sinus rhythm  QRS Axis: normal  Intervals: PR shortened  ST/T Wave abnormalities: normal  Conduction Disutrbances:none  Narrative Interpretation: no peaked t waves  Old EKG Reviewed: unchanged   1. Renal failure   2. Hyperkalemia       MDM  78 year old male with reported weakness and decreased eating and drinking over the last 4 days. Patient found to be in acute renal failure with elevated potassium. EKG without peak T waves patient has been given Kayexalate and IV fluids. Patient not candidate for Lasix due to renal failure.  D/w pt and family,  will need admission        Olivia Mackie, MD 03/24/12 [phone]

## 2012-03-24 NOTE — Progress Notes (Signed)
   CARE MANAGEMENT NOTE 03/24/2012  Patient:  Terry Flynn, Terry Flynn   Account Number:  [id number]  Date Initiated:  03/24/2012  Documentation initiated by:  Jiles Crocker  Subjective/Objective Assessment:   ADMITTED WITH WEAKNESS, HYPERKALEMIA     Action/Plan:   PCP: [name], MD; LIVES AT HOME WITH FAMILY MEMBERS; POSSIBLY NEEDS HHC AT DISCHARGE; AWAITING ON PT/OT EVALS   Anticipated DC Date:  03/31/2012   Anticipated DC Plan:  HOME W HOME HEALTH SERVICES      DC Planning Services  CM consult               Status of service:  In process, will continue to follow Medicare Important Message given?  NA - LOS <3 / Initial given by admissions (If response is "NO", the following Medicare IM given date fields will be blank)  Per UR Regulation:  Reviewed for med. necessity/level of care/duration of stay  Comments:  03/24/2012- B Iosefa Weintraub RN, BSN, MHA

## 2012-03-24 NOTE — Consult Note (Signed)
Reason for Consult:anxiety Referring Physician: unknown  Terry Flynn is an 78 y.o. male.  HPI: Terry Flynn is an 78 y.o. male with a PMH of dementia, cardiovascular and cerebrovascular disease, who was brought to the hospital for evaluation of progressive weakness, decreased PO intake, and new onset of the inability to ambulate with a walker, per his usual baseline. Seen by Dr. Christell Constant at the end of June for urinary frequency and urinary incontinence. Was put on medication to shrink his prostate, and since that time, his PO intake has been poor, he has been sleeping more than usual, and over the past 2 days, has been unable to get up out of bed. He has also been more confused and disoriented. Upon initial evaluation in the ER, the patient was found to be in ARF with hyperkalemia.    appeared to be confused now. Poor historian. Reports memory issues and trouble feeling anxious. Unable to give much details now but says her current anxiety meds were helping. Thinks he mood is ok. Denies any significant depressive symptoms. Limited ability to do much ADLs now.   Past Medical History  Diagnosis Date  . Back pain     DJD  . HTN (hypertension)   . CAD (coronary artery disease)     stent to RCA Mar 14, 1996 and 2003-03-15  . COPD (chronic obstructive pulmonary disease)   . GERD (gastroesophageal reflux disease)   . Headache     chronic, worse over last month  . Chronic renal insufficiency     baseline Cr around 1.4  . Depression 1996    since wife passed away  . Anxiety 1996    since wife passed away  . Confusion March 14, 2010    progressive since 03-14-2010, question of dementia  . HLD (hyperlipidemia)   . Stroke     Past Surgical History  Procedure Date  . Coronary stent placement 1997    RCA  . Coronary stent placement 03-15-2003    in-stent restenosis, stent replaced RCA.      Family History  Problem Relation Age of Onset  . Throat cancer Father     Smoker  . Heart disease Brother   . Asthma Son   .  Heart disease Brother     Social History:  reports that he has been smoking Cigarettes.  He has a 50 pack-year smoking history. He does not have any smokeless tobacco history on file. He reports that he drinks alcohol. He reports that he does not use illicit drugs.  Allergies:  Allergies  Allergen Reactions  . Statins Other (See Comments)    REACTION: MYALGIA    Medications: I have reviewed the patient's current medications.  Results for orders placed during the hospital encounter of 03/24/12 (from the past 48 hour(s))  URINALYSIS, ROUTINE W REFLEX MICROSCOPIC     Status: Abnormal   Collection Time   03/24/12  5:06 AM      Component Value Range Comment   Color, Urine YELLOW  YELLOW    APPearance CLOUDY (*) CLEAR    Specific Gravity, Urine 1.020  1.005 - 1.030    pH 5.5  5.0 - 8.0    Glucose, UA NEGATIVE  NEGATIVE mg/dL    Hgb urine dipstick NEGATIVE  NEGATIVE    Bilirubin Urine SMALL (*) NEGATIVE    Ketones, ur NEGATIVE  NEGATIVE mg/dL    Protein, ur NEGATIVE  NEGATIVE mg/dL    Urobilinogen, UA 0.2  0.0 - 1.0 mg/dL  Nitrite NEGATIVE  NEGATIVE    Leukocytes, UA NEGATIVE  NEGATIVE MICROSCOPIC NOT DONE ON URINES WITH NEGATIVE PROTEIN, BLOOD, LEUKOCYTES, NITRITE, OR GLUCOSE <1000 mg/dL.  CBC     Status: Abnormal   Collection Time   03/24/12  5:56 AM      Component Value Range Comment   WBC 5.0  4.0 - 10.5 K/uL    RBC 4.16 (*) 4.22 - 5.81 MIL/uL    Hemoglobin 12.6 (*) 13.0 - 17.0 g/dL    HCT 16.1 (*) 09.6 - 52.0 %    MCV 92.3  78.0 - 100.0 fL    MCH 30.3  26.0 - 34.0 pg    MCHC 32.8  30.0 - 36.0 g/dL    RDW 04.5  40.9 - 81.1 %    Platelets 170  150 - 400 K/uL   BASIC METABOLIC PANEL     Status: Abnormal   Collection Time   03/24/12  5:56 AM      Component Value Range Comment   Sodium 134 (*) 135 - 145 mEq/L    Potassium 6.2 (*) 3.5 - 5.1 mEq/L    Chloride 106  96 - 112 mEq/L    CO2 16 (*) 19 - 32 mEq/L    Glucose, Bld 88  70 - 99 mg/dL    BUN 73 (*) 6 - 23 mg/dL     Creatinine, Ser 9.14 (*) 0.50 - 1.35 mg/dL    Calcium 8.8  8.4 - 78.2 mg/dL    GFR calc non Af Amer 14 (*) >90 mL/min    GFR calc Af Amer 16 (*) >90 mL/min   HEPATIC FUNCTION PANEL     Status: Abnormal   Collection Time   03/24/12  5:56 AM      Component Value Range Comment   Total Protein 7.1  6.0 - 8.3 g/dL    Albumin 3.2 (*) 3.5 - 5.2 g/dL    AST 38 (*) 0 - 37 U/L    ALT 34  0 - 53 U/L    Alkaline Phosphatase 84  39 - 117 U/L    Total Bilirubin 0.1 (*) 0.3 - 1.2 mg/dL    Bilirubin, Direct <9.5  0.0 - 0.3 mg/dL    Indirect Bilirubin NOT CALCULATED  0.3 - 0.9 mg/dL   BASIC METABOLIC PANEL     Status: Abnormal   Collection Time   03/24/12  7:50 PM      Component Value Range Comment   Sodium 136  135 - 145 mEq/L    Potassium 6.7 (*) 3.5 - 5.1 mEq/L    Chloride 116 (*) 96 - 112 mEq/L    CO2 13 (*) 19 - 32 mEq/L    Glucose, Bld 81  70 - 99 mg/dL    BUN 63 (*) 6 - 23 mg/dL    Creatinine, Ser 6.21 (*) 0.50 - 1.35 mg/dL    Calcium 8.3 (*) 8.4 - 10.5 mg/dL    GFR calc non Af Amer 19 (*) >90 mL/min    GFR calc Af Amer 21 (*) >90 mL/min     US Renal  03/24/2012  *RADIOLOGY REPORT*  Clinical Data: Urinary frequency and incontinence.  RENAL/URINARY TRACT ULTRASOUND COMPLETE  Comparison:  08/29/2011  Findings:  Right Kidney:  10.5 cm.  No hydronephrosis.  No calculi.  24 mm right upper pole renal cyst.  15 mm right inferior pole renal cysts.  Cysts appear similar to the prior exam.  Left Kidney:  12.9 cm.  No  hydronephrosis.  5 mm echogenic focus in the left superior pole is most compatible with small parenchymal calcification.  No surrounding mass lesion.  This appears remote from the collecting system.  The cysts appear unchanged compared to prior exam. Largest cyst is in the inferior pole measuring 66 mm. Other smaller cysts appear similar.  Bladder:  Normal appearance of the urinary bladder.  Incidental visualization of the gallbladder demonstrates cholelithiasis.  IMPRESSION: No hydronephrosis  or obstruction.  Bilateral renal cysts. Tiny left upper pole parenchymal calcification.  Original Report Authenticated By: Andreas Newport, M.D.   Portable Chest 1 View  03/24/2012  *RADIOLOGY REPORT*  Clinical Data: Evaluate for recurrent pneumonia.  COPD with coronary artery disease hypertension and history of stroke. Congestion with shortness of breath  PORTABLE CHEST - 1 VIEW  Comparison: 02/23/2012  Findings: The patient is rotated slightly to the right.  Taking this into consideration heart and mediastinal contours are stable. Some motion artifact is present on the exam.  Taking this into consideration the lung fields demonstrate mild pulmonary vascular congestion without overt congestive failure. No focal infiltrates or pleural fluid are seen. Scattered calcified granulomata are seen  Bony structures appear intact.  IMPRESSION: Pulmonary vascular congestion with no signs of focal infiltrate or congestive failure.  Original Report Authenticated By: Bertha Stakes, M.D.    ROS Blood pressure 121/71, pulse 78, temperature 98.7 F (37.1 C), temperature source Oral, resp. rate 20, height 5\' 7"  (1.702 m), weight 86.183 kg (190 lb), SpO2 99.00%. Physical Exam    sleep, lying on chair  Mental Status Examination/Evaluation:  Objective: Appearance: confused Psychomotor Activity: Normal  Eye Contact:: poor Speech: limited speech  Volume: low Mood: not good Affect: ristricted Thought Process: Circumstanial  Orientation: Full  Thought Content: denies AVH/Psychosis  Suicidal Thoughts: No  Homicidal Thoughts: No  Judgement: Impaired  Insight: Shallow   Memory; poor for short and long term   DIAGNOSIS:   AXIS I  Dementia NOS, r/o  delerium nos, anxiety d/o nos AXIS II  def AXIS III  See medical history.  AXIS IV  unknown AXIS V  40   Treatment Plan Summary:    - Likely to have baseline cognitive deficits.    - continue to observe for Benzo withdrawal and gradual tapper off  Benzo with a plan to before d/c   - Will recommend out pt psy follow up after discharge including work up for dementia if not done before   - consider starting celexa 5-10 mg QAM to target anxiety symptoms. It will take sometimes to see significant benefits  - TSH if not done before   - Will follow on Monday   Wonda Cerise 03/24/2012, 10:49 PM

## 2012-03-24 NOTE — ED Notes (Addendum)
Pt family concerned about pt. States he has been weak, has not been eating, hx of dementia. AxO to time and date. Denies any pain.

## 2012-03-24 NOTE — H&P (Signed)
Hospital Admission Note Date: 03/24/2012  Patient name: Terry Flynn Medical record number: [id number] Date of birth: [birth year] Age: 78 y.o. Gender: male PCP: [name], MD  Attending physician: Maryruth Bun Rama, MD Emergency Contact: Dwon, Sky [phone] Code Status: DNR (discussed with son who confirms that the patient would not want to be resuscitated.)  Chief Complaint: weakness  History of Present Illness: Terry Flynn is an 78 y.o. male with a PMH of dementia, cardiovascular and cerebrovascular disease, who was brought to the hospital for evaluation of progressive weakness, decreased PO intake, and new onset of the inability to ambulate with a walker, per his usual baseline.  Seen by Dr. Christell Constant at the end of June for urinary frequency and urinary incontinence.  Was put on medication to shrink his prostate, and since that time, his PO intake has been poor, he has been sleeping more than usual, and over the past 2 days, has been unable to get up out of bed.  He has also been more confused and disoriented.  Upon initial evaluation in the ER, the patient was found to be in ARF with hyperkalemia.  Daughter in law thinks he may have some left sided weakness.  Past Medical History Past Medical History  Diagnosis Date  . Back pain     DJD  . HTN (hypertension)   . CAD (coronary artery disease)     stent to RCA 22-Feb-1996 and February 22, 2003  . COPD (chronic obstructive pulmonary disease)   . GERD (gastroesophageal reflux disease)   . Headache     chronic, worse over last month  . Chronic renal insufficiency     baseline Cr around 1.4  . Depression 1996    since wife passed away  . Anxiety 1996    since wife passed away  . Confusion 02-21-2010    progressive since 2010/02/21, question of dementia  . HLD (hyperlipidemia)   . Stroke     Past Surgical History Past Surgical History  Procedure Date  . Coronary stent placement 1997    RCA  . Coronary stent placement Feb 22, 2003   in-stent restenosis, stent replaced RCA.      Meds: Prior to Admission medications   Medication Sig Start Date End Date Taking? Authorizing Provider  acetaminophen (TYLENOL) 500 MG tablet Take 1,000 mg by mouth every 6 (six) hours as needed.   Yes Historical Provider, MD  albuterol (PROAIR HFA) 108 (90 BASE) MCG/ACT inhaler Inhale 2 puffs into the lungs every 4 (four) hours as needed for wheezing or shortness of breath. For breathing 02/26/12  Yes Clanford Cyndie Mull, MD  amLODipine (NORVASC) 10 MG tablet Take 10 mg by mouth daily.     Yes Historical Provider, MD  clopidogrel (PLAVIX) 75 MG tablet Take 75 mg by mouth daily.   Yes Historical Provider, MD  Febuxostat (ULORIC) 80 MG TABS Take 40-80 mg by mouth daily. Can take 40-80 mg depending on the pain, if pain is severe he will take 80mg    Yes Historical Provider, MD  LORazepam (ATIVAN) 2 MG tablet Take 2 mg by mouth every 8 (eight) hours as needed. For anxiety   Yes Historical Provider, MD  metoprolol succinate (TOPROL-XL) 25 MG 24 hr tablet Take 25 mg by mouth daily.     Yes Historical Provider, MD  PARoxetine (PAXIL) 20 MG tablet Take 30 mg by mouth every morning.    Yes Historical Provider, MD  sulfamethoxazole-trimethoprim (BACTRIM DS) 800-160 MG per tablet Take 1 tablet by  mouth 2 (two) times daily.   Yes Historical Provider, MD  traMADol (ULTRAM) 50 MG tablet Take 50 mg by mouth every 6 (six) hours as needed. For pain   Yes Historical Provider, MD  valsartan (DIOVAN) 320 MG tablet Take 320 mg by mouth daily.     Yes Historical Provider, MD    Allergies: Statins  Social History: History   Social History  . Marital Status: Widowed    Spouse Name: N/A    Number of Children: 1  . Years of Education: N/A   Occupational History  . Retired [employer]   .     Social History Main Topics  . Smoking status: Current Some Day Smoker -- 1.0 packs/day for 50 years    Types: Cigarettes  . Smokeless tobacco: Not on file  .  Alcohol Use: Yes     very occasional  . Drug Use: No  . Sexually Active: Not on file   Other Topics Concern  . Not on file   Social History Narrative   Lives in [place] with his son.Widowed since 1996.ADLs: toilet-ing independently, dresses himself, administers medications independently, some meal preparation, hygiene, ambulates with cane IADLs: most activities assisted by son, ie finances, groceries, driving     Family History:  Family History  Problem Relation Age of Onset  . Throat cancer Father     Smoker  . Heart disease Brother   . Asthma Son   . Heart disease Brother     Review of Systems: Constitutional: No fever, no chills;  Appetite diminished; No weight loss, no weight gain.  HEENT: No blurry vision, no diplopia, no pharyngitis, no dysphagia CV: No chest pain, no palpitations.  Resp: No SOB, no cough. GI: No nausea, no vomiting, no diarrhea, no melena, no hematochezia.  GU: No dysuria, no hematuria, + recent frequency and urinary incontinence  MSK: no myalgias, no arthralgias.  Neuro:  No headache, no focal neurological deficits, no history of seizures.  Psych: No depression, + Severe anxiety.  Endo: No thyroid disease, no DM, no heat intolerance, no cold intolerance, no polyuria, no polydipsia  Skin: No rashes, no skin lesions.  Heme: No easy bruising, no history of blood diseases.   Physical Exam: Blood pressure 119/72, pulse 75, temperature 98.3 F (36.8 C), resp. rate 24, height 5\' 7"  (1.702 m), SpO2 97.00%. BP 119/72  Pulse 75  Temp 98.3 F (36.8 C)  Resp 24  Ht 5\' 7"  (1.702 m)  SpO2 97%  General Appearance:    Alert, anxious,  appears stated age  Head:    Normocephalic, without obvious abnormality, atraumatic  Eyes:    PERRL, conjunctiva/corneas clear, EOM's intact  Ears:    Normal TM's and external ear canals, both ears  Nose:   Nares normal, septum midline, mucosa normal, no drainage    or sinus tenderness  Throat:   Lips, mucosa, and tongue dry.    Neck:   Supple, symmetrical, trachea midline, no adenopathy;       thyroid:  No enlargement/tenderness/nodules; no carotid   bruit or JVD  Back:     Symmetric, no curvature, ROM normal, no CVA tenderness  Lungs:     Diminished to auscultation bilaterally, respirations unlabored  Chest wall:    No tenderness or deformity  Heart:    Regular rate and rhythm, S1 and S2 normal, no murmur, rub   or gallop  Abdomen:     Soft, non-tender, bowel sounds active all four quadrants,  no masses, no organomegaly, ? Umbilical hernia  Genitalia:    Normal uncircumcised male without lesion, discharge or tenderness  Rectal:    Deferred.  Extremities:   Extremities normal, atraumatic, no cyanosis or edema  Pulses:   2+ and symmetric all extremities  Skin:   Skin color, texture, turgor normal, no rashes or lesions  Lymph nodes:   Cervical, supraclavicular, and axillary nodes normal  Neurologic:   CNII-XII grossly intact but not entirely cooperative with exam.  Generalized weakness.   Lab results: Basic Metabolic Panel:  Lab 03/24/12 7829  NA 134*  K 6.2*  CL 106  CO2 16*  GLUCOSE 88  BUN 73*  CREATININE 3.81*  CALCIUM 8.8  MG --  PHOS --   GFR The CrCl is unknown because both a height and weight (above a minimum accepted value) are required for this calculation.  CBC:  Lab 03/24/12 0556  WBC 5.0  NEUTROABS --  HGB 12.6*  HCT 38.4*  MCV 92.3  PLT 170   Urinalysis    Component Value Date/Time   COLORURINE YELLOW 03/24/2012 0506   APPEARANCEUR CLOUDY* 03/24/2012 0506   LABSPEC 1.020 03/24/2012 0506   PHURINE 5.5 03/24/2012 0506   GLUCOSEU NEGATIVE 03/24/2012 0506   HGBUR NEGATIVE 03/24/2012 0506   BILIRUBINUR SMALL* 03/24/2012 0506   KETONESUR NEGATIVE 03/24/2012 0506   PROTEINUR NEGATIVE 03/24/2012 0506   UROBILINOGEN 0.2 03/24/2012 0506   NITRITE NEGATIVE 03/24/2012 0506   LEUKOCYTESUR NEGATIVE 03/24/2012 0506      Imaging results:  No results found.  Assessment & Plan: Principal Problem:   *ARF (acute renal failure)  Admitted and placed on IVF  Possibly pre-renal (decreased PO intake) in the setting of ARB use.  Need to rule out post obstructive cause, given his history of urinary frequency, incontinence.  Check renal ultrasound.  Check urine culture. Active Problems:  CORONARY ARTERY DISEASE  Continue Plavix.  Hold beta blocker for now.  No complaints of chest pain.  12 lead EKG reviewed, non-acute  COPD UNSPECIFIED  Albuterol MDI PRN.  Hyperkalemia  Given Kayexalate in ED.  Hydrate.  EKG non-acute.  Monitor on tele until K+ normalized.  Metabolic acidosis  Likely from ARF.  Hydrate and monitor.  Dehydration  Secondary to poor PO intake.  Hydrating.  Anxiety  Continue PRN Ativan and Paxil.  Family requests psychiatric evaluation due to severe symptoms.  Dementia with behavioral disturbance  May benefit from anti-psychotics.  Will ask psychiatrist to evaluate.  PT/OT/SW evaluations to determine appropriate discharge plan.  Prophylaxis: Lovenox for DVT prophylaxis.  Time Spent On Admission: 1 hour.  RAMA,CHRISTINA 03/24/2012, 11:16 AM Pager [phone]

## 2012-03-24 NOTE — Progress Notes (Signed)
Repeatedly taking gown and telemetry off, trying to get oob, prn haldol given, will cont to monitor. Family at bedside unsuccessful at calming pt. Genia Harold

## 2012-03-24 NOTE — Progress Notes (Signed)
Pt repeatedly tries to get OOB despite reinstuction.  Md aware and ativan given.  Genia Harold

## 2012-03-25 DIAGNOSIS — R339 Retention of urine, unspecified: Secondary | ICD-10-CM | POA: Diagnosis present

## 2012-03-25 LAB — CBC
HCT: 34.9 % — ABNORMAL LOW (ref 39.0–52.0)
MCH: 30.2 pg (ref 26.0–34.0)
MCHC: 31.8 g/dL (ref 30.0–36.0)
MCV: 95.1 fL (ref 78.0–100.0)
Platelets: 126 10*3/uL — ABNORMAL LOW (ref 150–400)
RDW: 14.2 % (ref 11.5–15.5)
WBC: 3.1 10*3/uL — ABNORMAL LOW (ref 4.0–10.5)

## 2012-03-25 LAB — BASIC METABOLIC PANEL
BUN: 63 mg/dL — ABNORMAL HIGH (ref 6–23)
CO2: 15 mEq/L — ABNORMAL LOW (ref 19–32)
Calcium: 8.2 mg/dL — ABNORMAL LOW (ref 8.4–10.5)
Chloride: 115 mEq/L — ABNORMAL HIGH (ref 96–112)
Creatinine, Ser: 2.64 mg/dL — ABNORMAL HIGH (ref 0.50–1.35)
Glucose, Bld: 97 mg/dL (ref 70–99)

## 2012-03-25 LAB — URINE CULTURE: Special Requests: NORMAL

## 2012-03-25 LAB — TSH: TSH: 1.076 u[IU]/mL (ref 0.350–4.500)

## 2012-03-25 MED ORDER — STERILE WATER FOR INJECTION IV SOLN
INTRAVENOUS | Status: DC
  Administered 2012-03-25 (×2): via INTRAVENOUS
  Filled 2012-03-25 (×3): qty 850

## 2012-03-25 MED ORDER — SODIUM POLYSTYRENE SULFONATE 15 GM/60ML PO SUSP
15.0000 g | Freq: Once | ORAL | Status: AC
  Administered 2012-03-25: 15 g via ORAL
  Filled 2012-03-25: qty 60

## 2012-03-25 MED ORDER — QUETIAPINE FUMARATE 25 MG PO TABS
25.0000 mg | ORAL_TABLET | Freq: Every day | ORAL | Status: DC
  Administered 2012-03-25 – 2012-03-26 (×2): 25 mg via ORAL
  Filled 2012-03-25 (×3): qty 1

## 2012-03-25 MED ORDER — TAMSULOSIN HCL 0.4 MG PO CAPS
0.4000 mg | ORAL_CAPSULE | Freq: Every day | ORAL | Status: DC
  Administered 2012-03-25 – 2012-03-28 (×4): 0.4 mg via ORAL
  Filled 2012-03-25 (×4): qty 1

## 2012-03-25 MED ORDER — LORAZEPAM 1 MG PO TABS
1.0000 mg | ORAL_TABLET | Freq: Three times a day (TID) | ORAL | Status: DC | PRN
  Administered 2012-03-25 – 2012-03-28 (×7): 1 mg via ORAL
  Filled 2012-03-25 (×7): qty 1

## 2012-03-25 NOTE — Progress Notes (Signed)
CRITICAL VALUE ALERT  Critical value received:  k+  Date of notification:  03/25/12  Time of notification:  7:42  Critical value read back:yes  Nurse who received alert:  Emberly Tomasso  MD notified (1st page):  M. lynch  Time of first page:  7:50  MD notified (2nd page):  Time of second page:  Responding MD:  Daphane Shepherd  Time MD responded: 8:20

## 2012-03-25 NOTE — Evaluation (Signed)
Physical Therapy Evaluation Patient Details Name: Terry Flynn MRN: [id number] DOB: 16-Sep-1933 Today's Date: 03/25/2012 Time: 1022-1049 PT Time Calculation (min): 27 min  PT Assessment / Plan / Recommendation Clinical Impression  Pt presents with acute renal failure with history of dementia and cardiovascular disease.  Presents today with decreased overall strength, endurance and mobility. Pt will benefit from skilled PT in acute venue to address deficits.  PT recommends short term SNF to ensure pt safety and decrease burden of care.      PT Assessment  Patient needs continued PT services    Follow Up Recommendations  Skilled nursing facility    Barriers to Discharge        Equipment Recommendations  Defer to next venue    Recommendations for Other Services     Frequency Min 3X/week    Precautions / Restrictions Precautions Precautions: Fall Restrictions Weight Bearing Restrictions: No   Pertinent Vitals/Pain No pain      Mobility  Bed Mobility Bed Mobility: Supine to Sit Supine to Sit: 4: Min assist;3: Mod assist;HOB flat Details for Bed Mobility Assistance: Min/mod assist for trunk to attain sitting position with cues for hand placement to self assist.  Pt able to get LEs off EOB.  Transfers Transfers: Sit to Stand;Stand to Sit Sit to Stand: 1: +2 Total assist;From elevated surface;With upper extremity assist;From bed Sit to Stand: Patient Percentage: 60% Stand to Sit: 1: +2 Total assist;With upper extremity assist;With armrests;To chair/3-in-1 Stand to Sit: Patient Percentage: 60% Details for Transfer Assistance: Assist to steady once in standing (+2 for safety) with cues for hand placement when sitting/standing and safety.  Ambulation/Gait Ambulation/Gait Assistance: 4: Min assist;3: Mod assist Ambulation Distance (Feet): 50 Feet Assistive device: Rolling walker Ambulation/Gait Assistance Details: Provided assist to steady throughout ambulation and to assist  with positioning of RW.  cues for sequencing/technique with RW and for upright posture.   Gait Pattern: Step-through pattern;Decreased stride length;Trunk flexed Gait velocity: decreased Stairs: No Wheelchair Mobility Wheelchair Mobility: No    Exercises     PT Diagnosis: Difficulty walking;Abnormality of gait;Generalized weakness  PT Problem List: Decreased strength;Decreased activity tolerance;Decreased balance;Decreased mobility;Decreased safety awareness;Decreased knowledge of use of DME;Decreased knowledge of precautions PT Treatment Interventions: DME instruction;Gait training;Functional mobility training;Therapeutic activities;Therapeutic exercise;Balance training;Patient/family education   PT Goals Acute Rehab PT Goals PT Goal Formulation: With patient Time For Goal Achievement: 04/08/12 Potential to Achieve Goals: Good Pt will go Supine/Side to Sit: with supervision PT Goal: Supine/Side to Sit - Progress: Goal set today Pt will go Sit to Supine/Side: with supervision PT Goal: Sit to Supine/Side - Progress: Goal set today Pt will go Sit to Stand: with supervision PT Goal: Sit to Stand - Progress: Goal set today Pt will go Stand to Sit: with supervision PT Goal: Stand to Sit - Progress: Goal set today Pt will Ambulate: 51 - 150 feet;with supervision;with least restrictive assistive device PT Goal: Ambulate - Progress: Goal set today  Visit Information  Last PT Received On: 03/25/12 Assistance Needed: +2 (for safety)    Subjective Data  Subjective: Its a pleasure to meet you Patient Stated Goal: to get something to eat   Prior Functioning  Home Living Lives With: Family Available Help at Discharge: Skilled Nursing Facility Type of Home: House Home Adaptive Equipment: Walker - rolling Additional Comments: Pt was living with son and daughter in law, but will be D/Cing to SNF for short term placement.  Prior Function Level of Independence: Independent with assistive  device(s)  Vocation: Retired Comments: Per son, pt was able to get up and down on his own and ambulate around house with RW.  Communication Communication: HOH    Cognition  Overall Cognitive Status: History of cognitive impairments - at baseline Arousal/Alertness: Awake/alert Orientation Level: Appears intact for tasks assessed Behavior During Session: [facility] for tasks performed    Extremity/Trunk Assessment Right Lower Extremity Assessment RLE ROM/Strength/Tone: [facility] for tasks assessed RLE Coordination: WFL - gross motor Left Lower Extremity Assessment LLE ROM/Strength/Tone: WFL for tasks assessed LLE Coordination: WFL - gross motor Trunk Assessment Trunk Assessment: Kyphotic   Balance    End of Session PT - End of Session Equipment Utilized During Treatment: Gait belt Activity Tolerance: Patient tolerated treatment well;Patient limited by fatigue Patient left: in chair;with call bell/phone within reach;with nursing in room;with chair alarm set Nurse Communication: Mobility status  GP     Page, Meribeth Mattes 03/25/2012, 12:45 PM

## 2012-03-25 NOTE — Progress Notes (Signed)
OT Cancellation Note  Treatment cancelled today due to patient's refusal to participate. Pt just had gotten back to bed and nurse giving pt medication for headache.  Will re attempt next day  Barb Shear, [place] Kung 03/25/2012, 1:26 PM

## 2012-03-25 NOTE — Progress Notes (Signed)
Patient is having urinary frequency. He was bladder scan after voiding which showed 466 cc after voiding. np notifed.

## 2012-03-25 NOTE — Progress Notes (Signed)
PROGRESS NOTE  Terry Flynn ZOX:[id number] DOB: 1933/05/28 DOA: 03/24/2012 PCP: [name], MD  Brief narrative: Terry Flynn is an 78 y.o. male with a PMH of dementia, cardiovascular and cerebrovascular disease, who was brought to the hospital for evaluation of progressive weakness, decreased PO intake, and new onset of the inability to ambulate with a walker, per his usual baseline. On initial evaluation, he was found to be in ARF, with hyperkalemia and metabolic acidosis.  Interim History: Continued to be hyperkalemic, necessitating additional kayexalate last night.  Bladder scan confirmed urinary retention, but renal ultrasound negative for hydronephrosis.  Foley placed.  Had a dose of Haldol at 4 a.m. And patient is now lethargic.   Assessment/Plan: Principal Problem:  *ARF (acute renal failure)   Admitted and placed on IVF   Likely from pre-renal (decreased PO intake) in the setting of ARB use and post renal causes.   Renal ultrasound negative for hydronephrosis, but post-void bladder scan showed significant urinary retention.  Foley placed, start Flomax.  Creatinine improving. Active Problems:  CORONARY ARTERY DISEASE   Continue Plavix.   Hold beta blocker for now.   No complaints of chest pain.   12 lead EKG reviewed, non-acute COPD UNSPECIFIED   Albuterol MDI PRN.  Vascular congestion noted on CXR, no evidence of PNA. Hyperkalemia   Given 15 g Kayexalate in ED and an additional 30 g last night.   K+ 6.2--->6.7--->5.9, will give an additional 15 g Kayexalate.  Continue to hydrate.   EKG non-acute.   Monitor on tele until K+ normalized. Metabolic acidosis   Likely from ARF.   Hydrate and monitor.  Change IVF to Sterile water+NaHCO3. Dehydration   Secondary to poor PO intake.   Hydrating. Anxiety   Continue PRN Ativan and Paxil.   Family requests psychiatric evaluation due to severe symptoms.  Psych evaluation performed 03/24/12.   Recommends taper of Ativan, trial of Celexa but patient already on Paxil.  TSH WNL. Dementia with behavioral disturbance   May benefit from anti-psychotics. Rested after being given Haldol last night.   Will start Seroquel Q HS.  PT/OT/SW evaluations to determine appropriate discharge plan.  Urinary retention  No hydronephrosis on ultrasound.  Post void residual checked, residual of 466 cc noted.  Foley placed.  Start Flomax.  F/U urine cultures.  Code Status: DNR Family Communication: Terry, Flynn [phone], updated 03/24/12.  Left message 03/25/12. Disposition Plan: Home versus SNF, depending on progress, family support.  Medical Consultants:  Dr. Wonda Cerise, Psychiatry.  Other consultants:  Physical therapy  Occupational therapy  Antibiotics:  None   Subjective  Terry Flynn is lethargic this morning (received Haldol at 4:00 a.m.).  He is non-verbal.  RN reports no significant events overnight.   Objective   Objective: Filed Vitals:   03/24/12 1300 03/24/12 1500 03/24/12 2203 03/25/12 0500  BP: 115/65  121/71 109/64  Pulse: 72  78 77  Temp: 98.4 F (36.9 C)  98.7 F (37.1 C) 97.9 F (36.6 C)  TempSrc:   Oral Oral  Resp: 22  20 20   Height:      Weight:  86.183 kg (190 lb)    SpO2: 98%  99% 99%    Intake/Output Summary (Last 24 hours) at 03/25/12 1008 Last data filed at 03/25/12 0630  Gross per 24 hour  Intake 2026.25 ml  Output    500 ml  Net 1526.25 ml    Exam: Gen:  NAD, lethargic Cardiovascular:  RRR, No M/R/G  Respiratory: Lungs diminished, occasional rhonchi Gastrointestinal: Abdomen soft, NT/ND with normal active bowel sounds. Extremities: No C/E/C  Data Reviewed: Basic Metabolic Panel:  Lab 03/25/12 1610 03/24/12 1950 03/24/12 0556  NA 136 136 134*  K 5.9* 6.7* --  CL 115* 116* 106  CO2 15* 13* 16*  GLUCOSE 97 81 88  BUN 63* 63* 73*  CREATININE 2.64* 3.02* 3.81*  CALCIUM 8.2* 8.3* 8.8  MG -- -- --    PHOS -- -- --   GFR Estimated Creatinine Clearance: 24.6 ml/min (by C-G formula based on Cr of 2.64). Liver Function Tests:  Lab 03/24/12 0556  AST 38*  ALT 34  ALKPHOS 84  BILITOT 0.1*  PROT 7.1  ALBUMIN 3.2*    CBC:  Lab 03/25/12 0526 03/24/12 0556  WBC 3.1* 5.0  NEUTROABS -- --  HGB 11.1* 12.6*  HCT 34.9* 38.4*  MCV 95.1 92.3  PLT 126* 170   Thyroid function studies  Basename 03/24/12 1950  TSH 1.076  T4TOTAL --  T3FREE --  THYROIDAB --   Microbiology No results found for this or any previous visit (from the past 240 hour(s)).  Procedures and Diagnostic Studies:  US Renal 03/24/2012 IMPRESSION: No hydronephrosis or obstruction.  Bilateral renal cysts. Tiny left upper pole parenchymal calcification.  Original Report Authenticated By: Andreas Newport, M.D.    Portable Chest 1 View 03/24/2012  IMPRESSION: Pulmonary vascular congestion with no signs of focal infiltrate or congestive failure.  Original Report Authenticated By: Bertha Stakes, M.D.    Scheduled Meds:    . sodium chloride   Intravenous STAT  . clopidogrel  75 mg Oral Daily  . enoxaparin (LOVENOX) injection  30 mg Subcutaneous Q24H  . febuxostat  40-80 mg Oral Daily  . nicotine  21 mg Transdermal Daily  . PARoxetine  30 mg Oral Daily  . pneumococcal 23 valent vaccine  0.5 mL Intramuscular Tomorrow-1000  . sodium polystyrene  30 g Oral Once  . Tamsulosin HCl  0.4 mg Oral Daily   Continuous Infusions:    . sodium chloride 125 mL/hr at 03/25/12 0122      LOS: 1 day   Terry Aldo, MD Pager [phone]  03/25/2012, 10:08 AM

## 2012-03-26 LAB — CBC
HCT: 30.9 % — ABNORMAL LOW (ref 39.0–52.0)
Hemoglobin: 10 g/dL — ABNORMAL LOW (ref 13.0–17.0)
RBC: 3.33 MIL/uL — ABNORMAL LOW (ref 4.22–5.81)
WBC: 4.7 10*3/uL (ref 4.0–10.5)

## 2012-03-26 LAB — BASIC METABOLIC PANEL
CO2: 23 mEq/L (ref 19–32)
Chloride: 109 mEq/L (ref 96–112)
GFR calc non Af Amer: 33 mL/min — ABNORMAL LOW (ref >90)
Glucose, Bld: 117 mg/dL — ABNORMAL HIGH (ref 70–99)
Potassium: 4.5 mEq/L (ref 3.5–5.1)
Sodium: 140 mEq/L (ref 135–145)

## 2012-03-26 LAB — PRO B NATRIURETIC PEPTIDE: Pro B Natriuretic peptide (BNP): 375 pg/mL (ref 0–450)

## 2012-03-26 MED ORDER — PANTOPRAZOLE SODIUM 40 MG PO TBEC
40.0000 mg | DELAYED_RELEASE_TABLET | Freq: Every day | ORAL | Status: DC
  Administered 2012-03-26 – 2012-03-28 (×3): 40 mg via ORAL
  Filled 2012-03-26 (×4): qty 1

## 2012-03-26 MED ORDER — SODIUM CHLORIDE 0.9 % IV SOLN
INTRAVENOUS | Status: DC
  Administered 2012-03-26 – 2012-03-28 (×6): via INTRAVENOUS

## 2012-03-26 NOTE — Progress Notes (Signed)
PROGRESS NOTE  Terry Flynn AVW:[id number] DOB: [birth year] DOA: 03/24/2012 PCP: [name], MD  Brief narrative: Terry Flynn is an 78 y.o. male with a PMH of dementia, cardiovascular and cerebrovascular disease, who was brought to the hospital for evaluation of progressive weakness, decreased PO intake, and new onset of the inability to ambulate with a walker, per his usual baseline. On initial evaluation, he was found to be in ARF, with hyperkalemia and metabolic acidosis.  Interim History: Stable overnight.  RN reports black, tarry looking stools.  Assessment/Plan: Principal Problem:  *ARF (acute renal failure)   Admitted and placed on IVF   Likely from pre-renal (decreased PO intake) in the setting of ARB use and post renal causes.   Renal ultrasound negative for hydronephrosis, but post-void bladder scan showed significant urinary retention.  Foley placed, started Flomax 03/25/12.  Creatinine improving. Active Problems:  Melena / Normocytic anemia  Hold Plavix, Lovenox.  Start PPI.  Heme check stool.  SCDs for DVT prophylaxis.  Monitor hemoglobin. 2 g drop since admission. CORONARY ARTERY DISEASE   Continue Plavix.   Hold beta blocker for now.   No complaints of chest pain.   12 lead EKG reviewed, non-acute COPD UNSPECIFIED   Albuterol MDI PRN.  Vascular congestion noted on CXR, no evidence of PNA. Hyperkalemia   Given 15 g Kayexalate in ED and an additional 30 g last night.   K+ 6.2--->6.7--->5.9--->4.5, status post Kayexalate x 3.  Continue to hydrate.   EKG non-acute. OK to d/c tele now that K+ normal.  Monitor on tele until K+ normalized. Metabolic acidosis   Likely from ARF.   Continue IVF, acidosis resolved on bicarb drip, which was stopped 03/25/12.   Dehydration   Secondary to poor PO intake.   Hydrating. Anxiety   Continue PRN Ativan and Paxil.   Family requests psychiatric evaluation due to severe symptoms.  Psych  evaluation performed 03/24/12.  Recommends taper of Ativan, trial of Celexa but patient already on Paxil.  TSH WNL. Dementia with behavioral disturbance   May benefit from anti-psychotics. Rested after being given Haldol. Started on Seroquel 03/25/12.  Continue Seroquel Q HS.  PT/OT/SW evaluations to determine appropriate discharge plan.  PT recommends SNF.  Urinary retention  No hydronephrosis on ultrasound.  Post void residual checked, residual of 466 cc noted.  Foley placed.  Continue Flomax.  Voiding trial soon.  If fails, recommend urology evaluation (can be d/c'd with foley and f/u with urologist as an outpatient)  Urine cultures negative.  Code Status: DNR Family Communication: Tremain, Rucinski [phone], updated 03/24/12 and 03/26/12. Disposition Plan: SNF.  Medical Consultants:  Dr. Wonda Cerise, Psychiatry.  Other consultants:  Physical therapy: SNF.  Occupational therapy  Antibiotics:  None   Subjective  Terry Flynn is awake, alert, sitting up in chair.  Requesting "Ativan".  Much more oriented, though still a bit confused.  Complains of loose stools that are black.  No complaints of pain, dyspnea or cough.   Objective   Objective: Filed Vitals:   03/25/12 0500 03/25/12 1300 03/25/12 2134 03/26/12 0633  BP: 109/64 114/70 135/79 119/71  Pulse: 77 81 93 113  Temp: 97.9 F (36.6 C) 98.3 F (36.8 C) 98.5 F (36.9 C) 99 F (37.2 C)  TempSrc: Oral Oral Oral Oral  Resp: 20 19 18 20   Height:      Weight:      SpO2: 99% 97% 98% 95%    Intake/Output Summary (Last 24 hours) at  03/26/12 1109 Last data filed at 03/26/12 0636  Gross per 24 hour  Intake    425 ml  Output   1625 ml  Net  -1200 ml    Exam: Gen:  NAD, more awake Cardiovascular:  RRR, No M/R/G Respiratory: Lungs diminished, occasional rhonchi Gastrointestinal: Abdomen soft, NT/ND with normal active bowel sounds. Extremities: No C/E/C  Data Reviewed: Basic Metabolic  Panel:  Lab 03/26/12 0530 03/25/12 0526 03/24/12 1950 03/24/12 0556  NA 140 136 136 134*  K 4.5 5.9* -- --  CL 109 115* 116* 106  CO2 23 15* 13* 16*  GLUCOSE 117* 97 81 88  BUN 48* 63* 63* 73*  CREATININE 1.88* 2.64* 3.02* 3.81*  CALCIUM 8.0* 8.2* 8.3* 8.8  MG -- -- -- --  PHOS -- -- -- --   GFR Estimated Creatinine Clearance: 34.5 ml/min (by C-G formula based on Cr of 1.88). Liver Function Tests:  Lab 03/24/12 0556  AST 38*  ALT 34  ALKPHOS 84  BILITOT 0.1*  PROT 7.1  ALBUMIN 3.2*    CBC:  Lab 03/26/12 0530 03/25/12 0526 03/24/12 0556  WBC 4.7 3.1* 5.0  NEUTROABS -- -- --  HGB 10.0* 11.1* 12.6*  HCT 30.9* 34.9* 38.4*  MCV 92.8 95.1 92.3  PLT 124* 126* 170   Thyroid function studies  Basename 03/24/12 1950  TSH 1.076  T4TOTAL --  T3FREE --  THYROIDAB --   Microbiology Recent Results (from the past 240 hour(s))  URINE CULTURE     Status: Normal   Collection Time   03/24/12  5:06 AM      Component Value Range Status Comment   Specimen Description URINE, CLEAN CATCH   Final    Special Requests Was on Septra as outpatient Normal   Final    Culture  Setup Time 03/25/2012 01:19   Final    Colony Count NO GROWTH   Final    Culture NO GROWTH   Final    Report Status 03/25/2012 FINAL   Final     Procedures and Diagnostic Studies:  US Renal 03/24/2012 IMPRESSION: No hydronephrosis or obstruction.  Bilateral renal cysts. Tiny left upper pole parenchymal calcification.  Original Report Authenticated By: Andreas Newport, M.D.    Portable Chest 1 View 03/24/2012  IMPRESSION: Pulmonary vascular congestion with no signs of focal infiltrate or congestive failure.  Original Report Authenticated By: Bertha Stakes, M.D.    Scheduled Meds:    . clopidogrel  75 mg Oral Daily  . enoxaparin (LOVENOX) injection  30 mg Subcutaneous Q24H  . febuxostat  40-80 mg Oral Daily  . nicotine  21 mg Transdermal Daily  . PARoxetine  30 mg Oral Daily  . QUEtiapine  25 mg Oral QHS    . Tamsulosin HCl  0.4 mg Oral Daily   Continuous Infusions:    . sodium chloride 100 mL/hr at 03/26/12 0747  . DISCONTD:  sodium bicarbonate infusion sterile water 1000 mL 100 mL/hr at 03/25/12 2301      LOS: 2 days   Hillery Aldo, MD Pager [phone]  03/26/2012, 11:09 AM

## 2012-03-27 ENCOUNTER — Encounter (HOSPITAL_COMMUNITY): Payer: Self-pay | Admitting: *Deleted

## 2012-03-27 DIAGNOSIS — F0390 Unspecified dementia without behavioral disturbance: Secondary | ICD-10-CM

## 2012-03-27 DIAGNOSIS — F411 Generalized anxiety disorder: Secondary | ICD-10-CM

## 2012-03-27 LAB — BASIC METABOLIC PANEL
BUN: 30 mg/dL — ABNORMAL HIGH (ref 6–23)
CO2: 22 mEq/L (ref 19–32)
Chloride: 111 mEq/L (ref 96–112)
GFR calc Af Amer: 52 mL/min — ABNORMAL LOW (ref >90)
Glucose, Bld: 112 mg/dL — ABNORMAL HIGH (ref 70–99)
Potassium: 3.9 mEq/L (ref 3.5–5.1)

## 2012-03-27 LAB — CBC
HCT: 27.6 % — ABNORMAL LOW (ref 39.0–52.0)
Hemoglobin: 9.1 g/dL — ABNORMAL LOW (ref 13.0–17.0)
MCHC: 33 g/dL (ref 30.0–36.0)
MCV: 91.1 fL (ref 78.0–100.0)

## 2012-03-27 MED ORDER — RIVASTIGMINE 4.6 MG/24HR TD PT24
4.6000 mg | MEDICATED_PATCH | Freq: Every day | TRANSDERMAL | Status: DC
  Administered 2012-03-27 – 2012-03-28 (×2): 4.6 mg via TRANSDERMAL
  Filled 2012-03-27 (×2): qty 1

## 2012-03-27 MED ORDER — MEMANTINE HCL 5 MG PO TABS
5.0000 mg | ORAL_TABLET | Freq: Two times a day (BID) | ORAL | Status: DC
  Administered 2012-03-27 – 2012-03-28 (×2): 5 mg via ORAL
  Filled 2012-03-27 (×3): qty 1

## 2012-03-27 MED ORDER — QUETIAPINE FUMARATE 50 MG PO TABS
50.0000 mg | ORAL_TABLET | Freq: Every day | ORAL | Status: DC
  Administered 2012-03-27: 50 mg via ORAL
  Filled 2012-03-27 (×2): qty 1

## 2012-03-27 MED ORDER — METOPROLOL SUCCINATE ER 25 MG PO TB24
25.0000 mg | ORAL_TABLET | Freq: Every day | ORAL | Status: DC
  Administered 2012-03-27 – 2012-03-28 (×2): 25 mg via ORAL
  Filled 2012-03-27 (×2): qty 1

## 2012-03-27 NOTE — Progress Notes (Signed)
Patient Identification:  Terry Flynn Date of Evaluation:  03/27/2012 Reason for Consult:  Increased anxiety, Dementia  Referring Provider: Dr. Darnelle Catalan  History of Present Illness: Pt with dementia, CVD, is admitted for failure to thrive and progressive weakness, confused, disoriented and is found to be ARF and hyperkalemia.    Past Psychiatric History: Not known.  Assessment/Plan:  Discussed with Dr. Darnelle Catalan.  Pt is seen with SW Tresa Endo who says she is preparing to transfer him to SNF.  Pt is greeted and he responds slowly with a greeting.  He has good eye contact.  He says he wants to go to the SNF.  There is a conflict with SSRI and Tramadol and one of the other needs to be discontinued due to potentiation of serotonin and possible serotonin syndrome.  He may benefit from slowing memory loss with anticholinergic esterase inhibitor for his dementia. RECOMMENDATION:  1.  Suggest deletion of Tramadol, continue Paxil for depression 2.  Increase SGA Seroquel, quetiapine, 50 mg HS for restorative sleep and calm anxiety; improve cognition 3.  Suggest Namenda 5 mg BID for dementia  4.  Suggest Exelon 4.6 mg patch daily for dementia  5.  No further psychiatric needs identified.  MD Psychiatrist signs off.  Meggan Dhaliwal J. Ferol Luz, MD Psychiatrist  03/27/2012 5:30 PM

## 2012-03-27 NOTE — Progress Notes (Signed)
CSW anticipating discharge Tuesday. Son to complete admission paperwork @ [facility] this afternoon.   Clinical Social Work Department CLINICAL SOCIAL WORK PLACEMENT NOTE 03/27/2012  Patient:  Terry Flynn, Terry Flynn  Account Number:  [id number] Admit date:  03/24/2012  Clinical Social Worker:  Orpah Greek  Date/time:  03/27/2012 01:44 PM  Clinical Social Work is seeking post-discharge placement for this patient at the following level of care:   SKILLED NURSING   (*CSW will update this form in Epic as items are completed)   03/27/2012  Patient/family provided with [facility] Department of Clinical Social Work's list of facilities offering this level of care within the geographic area requested by the patient (or if unable, by the patient's family).  03/27/2012  Patient/family informed of their freedom to choose among providers that offer the needed level of care, that participate in Medicare, Medicaid or managed care program needed by the patient, have an available bed and are willing to accept the patient.  03/27/2012  Patient/family informed of [facility]' ownership interest in [facility], as well as of the fact that they are under no obligation to receive care at this facility.  PASARR submitted to EDS on 03/27/2012 PASARR number received from [employer] on 03/27/2012  FL2 transmitted to all facilities in geographic area requested by pt/family on  03/27/2012 FL2 transmitted to all facilities within larger geographic area on   Patient informed that his/her managed care company has contracts with or will negotiate with  certain facilities, including the following:     Patient/family informed of bed offers received:  03/27/2012 Patient chooses bed at [facility] Physician recommends and patient chooses bed at    Patient to be transferred to [facility] on   Patient to be transferred to facility by   The following physician request were  entered in Epic:   Additional Comments:    Unice Bailey, LCSW [facility] Clinical Social Worker cell #: [phone]

## 2012-03-27 NOTE — Progress Notes (Signed)
PROGRESS NOTE  WOODLEY PETZOLD ZOX:[id number] DOB: [birth year] DOA: 03/24/2012 PCP: [name], MD  Brief narrative: BASIR NIVEN is an 78 y.o. male with a PMH of dementia, cardiovascular and cerebrovascular disease, who was brought to the hospital for evaluation of progressive weakness, decreased PO intake, and new onset of the inability to ambulate with a walker, per his usual baseline. On initial evaluation, he was found to be in ARF, with hyperkalemia and metabolic acidosis.  Interim History: Stable overnight.   Assessment/Plan: Principal Problem:  *ARF (acute renal failure)   Admitted and placed on IVF   Likely from pre-renal (decreased PO intake) in the setting of ARB use and post renal causes.   Renal ultrasound negative for hydronephrosis, but post-void bladder scan showed significant urinary retention.  Foley placed, started Flomax 03/25/12.  Will attempt voiding trial today.  Creatinine improving, almost at baseline (1.3). Active Problems:  Melena / Normocytic anemia  Continue to hold Plavix, Lovenox.  Continue PPI.  Heme check stool ordered, pending.  SCDs for DVT prophylaxis.  Monitor hemoglobin. 3 g drop since admission. CORONARY ARTERY DISEASE   Plavix on hold.   Resume beta blocker.  No complaints of chest pain.   12 lead EKG reviewed, non-acute COPD UNSPECIFIED   Albuterol MDI PRN.  Vascular congestion noted on CXR, no evidence of PNA. Hyperkalemia   Given 15 g Kayexalate in ED and an additional 30 g last night.   Resolved, status post Kayexalate x 3.  Continue to hydrate.   EKG non-acute.  Tele d/c'd 03/26/12. Metabolic acidosis   Likely from ARF.   Continue IVF, acidosis resolved on bicarb drip, which was stopped 03/25/12.   Dehydration   Secondary to poor PO intake.   Hydrating. Anxiety   Continue PRN Ativan and Paxil.   Family requests psychiatric evaluation due to severe symptoms.  Psych evaluation performed 03/24/12.   Recommends taper of Ativan, trial of Celexa but patient already on Paxil.  TSH WNL. Dementia with behavioral disturbance   May benefit from anti-psychotics. Rested after being given Haldol. Started on Seroquel 03/25/12.  Continue Seroquel Q HS.  Family reports patient is much clearer and much less anxious.  PT/OT/SW evaluations to determine appropriate discharge plan.  PT/OT recommends SNF.  Urinary retention  No hydronephrosis on ultrasound.  Post void residual checked, residual of 466 cc noted.  Foley placed.  Continue Flomax.  Voiding trial today.  If fails, recommend urology evaluation (can be d/c'd with foley and f/u with urologist as an outpatient).  Urine cultures negative.  Code Status: DNR Family Communication: Chucky, Homes [phone], updated at bedside 03/27/12. Disposition Plan: SNF.  Medical Consultants:  Dr. Wonda Cerise, Psychiatry.  Other consultants:  Physical therapy: SNF.  Occupational therapy  Antibiotics:  None   Subjective  Mr. Vahle is awake, alert, sitting up in chair. Much more oriented, and son confirms that behavior/appearance much improved.  No complaints of pain, dyspnea, nausea or vomiting.   Objective   Objective: Filed Vitals:   03/26/12 0633 03/26/12 1521 03/26/12 2205 03/27/12 0512  BP: 119/71 121/73 143/83 127/65  Pulse: 113 82 90 85  Temp: 99 F (37.2 C) 98 F (36.7 C) 97.6 F (36.4 C) 97.8 F (36.6 C)  TempSrc: Oral Axillary Oral Oral  Resp: 20 18 18 18   Height:      Weight:      SpO2: 95% 100% 98% 98%    Intake/Output Summary (Last 24 hours) at 03/27/12 1344 Last data  filed at 03/27/12 1253  Gross per 24 hour  Intake   1525 ml  Output   1325 ml  Net    200 ml    Exam: Gen:  NAD, more awake Cardiovascular:  RRR, No M/R/G Respiratory: Lungs diminished, occasional rhonchi Gastrointestinal: Abdomen soft, NT/ND with normal active bowel sounds. Extremities: No C/E/C  Data Reviewed: Basic  Metabolic Panel:  Lab 03/27/12 1191 03/26/12 0530 03/25/12 0526 03/24/12 1950 03/24/12 0556  NA 141 140 136 136 134*  K 3.9 4.5 -- -- --  CL 111 109 115* 116* 106  CO2 22 23 15* 13* 16*  GLUCOSE 112* 117* 97 81 88  BUN 30* 48* 63* 63* 73*  CREATININE 1.46* 1.88* 2.64* 3.02* 3.81*  CALCIUM 7.7* 8.0* 8.2* 8.3* 8.8  MG -- -- -- -- --  PHOS -- -- -- -- --   GFR Estimated Creatinine Clearance: 44.4 ml/min (by C-G formula based on Cr of 1.46). Liver Function Tests:  Lab 03/24/12 0556  AST 38*  ALT 34  ALKPHOS 84  BILITOT 0.1*  PROT 7.1  ALBUMIN 3.2*    CBC:  Lab 03/27/12 0415 03/26/12 0530 03/25/12 0526 03/24/12 0556  WBC 4.3 4.7 3.1* 5.0  NEUTROABS -- -- -- --  HGB 9.1* 10.0* 11.1* 12.6*  HCT 27.6* 30.9* 34.9* 38.4*  MCV 91.1 92.8 95.1 92.3  PLT 119* 124* 126* 170   Thyroid function studies  Basename 03/24/12 1950  TSH 1.076  T4TOTAL --  T3FREE --  THYROIDAB --   Microbiology Recent Results (from the past 240 hour(s))  URINE CULTURE     Status: Normal   Collection Time   03/24/12  5:06 AM      Component Value Range Status Comment   Specimen Description URINE, CLEAN CATCH   Final    Special Requests Was on Septra as outpatient Normal   Final    Culture  Setup Time 03/25/2012 01:19   Final    Colony Count NO GROWTH   Final    Culture NO GROWTH   Final    Report Status 03/25/2012 FINAL   Final     Procedures and Diagnostic Studies:  US Renal 03/24/2012 IMPRESSION: No hydronephrosis or obstruction.  Bilateral renal cysts. Tiny left upper pole parenchymal calcification.  Original Report Authenticated By: Andreas Newport, M.D.    Portable Chest 1 View 03/24/2012  IMPRESSION: Pulmonary vascular congestion with no signs of focal infiltrate or congestive failure.  Original Report Authenticated By: Bertha Stakes, M.D.    Scheduled Meds:    . febuxostat  40-80 mg Oral Daily  . nicotine  21 mg Transdermal Daily  . pantoprazole  40 mg Oral Q0600  . PARoxetine  30  mg Oral Daily  . QUEtiapine  25 mg Oral QHS  . Tamsulosin HCl  0.4 mg Oral Daily   Continuous Infusions:    . sodium chloride 100 mL/hr at 03/27/12 1238      LOS: 3 days   Hillery Aldo, MD Pager [phone]  03/27/2012, 1:44 PM

## 2012-03-27 NOTE — Evaluation (Signed)
Occupational Therapy Evaluation Patient Details Name: Terry Flynn MRN: [id number] DOB: 17-Jun-1933 Today's Date: 03/27/2012 Time: 4098-1191 OT Time Calculation (min): 17 min  OT Assessment / Plan / Recommendation Clinical Impression  This 78 year old man was admitted with acute renal failure.  He has a h/o dementia and cardiovascular disease.  At home, he demonstrated decreased po intake and inability to walk.  He will benefit from skilled OT to increase strength, balance, and activity tolerance for ADLs    OT Assessment  Patient needs continued OT Services    Follow Up Recommendations  Skilled nursing facility--unless family prefers home with 24/7 min A    Barriers to Discharge      Equipment Recommendations  Defer to next venue    Recommendations for Other Services    Frequency  Min 1X/week    Precautions / Restrictions Precautions Precautions: Fall Restrictions Weight Bearing Restrictions: No   Pertinent Vitals/Pain No pain    ADL  Eating/Feeding: Simulated;Set up Where Assessed - Eating/Feeding: Chair Grooming: Simulated;Supervision/safety Where Assessed - Grooming: Supported standing Upper Body Bathing: Simulated;Supervision/safety Where Assessed - Upper Body Bathing: Unsupported sitting Lower Body Bathing: Simulated;Minimal assistance Where Assessed - Lower Body Bathing: Supported sit to stand Upper Body Dressing: Simulated;Minimal assistance (lines) Where Assessed - Upper Body Dressing: Unsupported sitting Lower Body Dressing: Simulated;Minimal assistance;Performed (performed socks) Where Assessed - Lower Body Dressing: Supported sit to Pharmacist, hospital: Simulated;Minimal assistance (chair to bed) Toilet Transfer Method:  (ambulate) Toileting - Clothing Manipulation and Hygiene: Simulated;Minimal assistance Where Assessed - Engineer, mining and Hygiene: Sit to stand from 3-in-1 or toilet Equipment Used: Gait belt;Rolling  walker Transfers/Ambulation Related to ADLs: Min A ambulation; cotx with PT.  Now pt only needs A x 1 ADL Comments: Attended to task well:  mod A to reposition himself in bed    OT Diagnosis: Generalized weakness  OT Problem List: Decreased strength;Decreased activity tolerance;Impaired balance (sitting and/or standing);Decreased cognition OT Treatment Interventions: Self-care/ADL training;DME and/or AE instruction;Therapeutic activities;Cognitive remediation/compensation;Balance training;Patient/family education   OT Goals Acute Rehab OT Goals OT Goal Formulation: With patient (generally agreeable) Time For Goal Achievement: 04/10/12 Potential to Achieve Goals: Good ADL Goals Pt Will Perform Lower Body Bathing: with supervision;Sit to stand from chair ADL Goal: Lower Body Bathing - Progress: Goal set today Pt Will Perform Lower Body Dressing: with supervision;Sit to stand from chair ADL Goal: Lower Body Dressing - Progress: Goal set today Pt Will Transfer to Toilet: with supervision;Ambulation;Comfort height toilet ADL Goal: Toilet Transfer - Progress: Goal set today Miscellaneous OT Goals Miscellaneous OT Goal #1: Pt will safely use RW for adls with no more than 2 vcs per session OT Goal: Miscellaneous Goal #1 - Progress: Goal set today  Visit Information  Last OT Received On: 03/27/12 Assistance Needed: +1 PT/OT Co-Evaluation/Treatment: Yes    Subjective Data  Subjective: "Yes, a lady in [facility] told me I had the prettiest blue eyes" Patient Stated Goal: none stated; agreeable to OT/PT   Prior Functioning  Home Living Lives With: Family Available Help at Discharge: Skilled Nursing Facility Bathroom Shower/Tub: Walk-in shower Bathroom Toilet: Handicapped height Home Adaptive Equipment: Walker - rolling Additional Comments: Pt was living with son and daughter in law, but will be D/Cing to SNF for short term placement.  Prior Function Level of Independence: Independent  with assistive device(s) Communication Communication: HOH    Cognition  Overall Cognitive Status: History of cognitive impairments - at baseline Arousal/Alertness: Awake/alert Orientation Level: Appears intact for tasks  assessed Behavior During Session: [facility] for tasks performed    Extremity/Trunk Assessment Right Upper Extremity Assessment RUE ROM/Strength/Tone: Within functional levels Left Upper Extremity Assessment LUE ROM/Strength/Tone: Within functional levels Trunk Assessment Trunk Assessment: Kyphotic   Mobility Bed Mobility Sit to Supine: 4: Min assist (then mod A to reposition in bed) Transfers Sit to Stand: 4: Min assist   Exercise    Balance    End of Session OT - End of Session Activity Tolerance: Patient tolerated treatment well Patient left: in bed;with bed alarm set;with call bell/phone within reach  GO     Terry Flynn 03/27/2012, 11:58 AM Marica Otter, OTR/L [phone] 03/27/2012

## 2012-03-27 NOTE — Progress Notes (Signed)
Chart review.  Noted that Pt's d/c plan is for SNF.  Noted that psych was consulted for medication mgmt.  Psych CSW available for any psych resources that the Pt may need.  [name], LCSWA Clinical Social Work [phone]

## 2012-03-27 NOTE — Progress Notes (Signed)
PT. VOIDED, BLADDER SCANNED AND SAW 90C'S OF URINE LEFT IN BLADDER. WILL CONTINUE TO MONITOR AND OBSERVE PT.

## 2012-03-27 NOTE — Progress Notes (Signed)
Clinical Social Work Department BRIEF PSYCHOSOCIAL ASSESSMENT 03/27/2012  Patient:  Terry Flynn, Terry Flynn     Account Number:  [id number]     Admit date:  03/24/2012  Clinical Social Worker:  Orpah Greek  Date/Time:  03/27/2012 01:39 PM  Referred by:  Physician  Date Referred:  03/26/2012 Referred for  SNF Placement   Other Referral:   Interview type:  Patient Other interview type:   and son    PSYCHOSOCIAL DATA Living Status:  FAMILY Admitted from facility:   Level of care:   Primary support name:  luian, schumpert. (son) h#: [phone] c#: [phone] Primary support relationship to patient:  CHILD, ADULT Degree of support available:   very good    CURRENT CONCERNS Current Concerns  Post-Acute Placement   Other Concerns:    SOCIAL WORK ASSESSMENT / PLAN CSW spoke with patient & son re: discharge planning. Noted PT recommending SNF for rehab. Patient & son are agreeable with plan for SNF and requesting [facility].   Assessment/plan status:  Information/Referral to [facility] Other assessment/ plan:   Information/referral to community resources:   CSW completed FL2 and faxed information to [facility], confirmed with Tabitha @ SNF that they will have a bed available.    PATIENT'S/FAMILY'S RESPONSE TO PLAN OF CARE: Patient states that his mother had been to [facility] in the past and would like to go there.        Unice Bailey, LCSW [facility] Clinical Social Worker cell #: [phone]

## 2012-03-27 NOTE — Progress Notes (Signed)
Physical Therapy Treatment Patient Details Name: Terry Flynn MRN: [id number] DOB: 03/26/1933 Today's Date: 03/27/2012 Time: 4098-1191 PT Time Calculation (min): 22 min  PT Assessment / Plan / Recommendation Comments on Treatment Session  Progressing well with mobility. Continue to recommend ST rehab at SNF to maximize independence and safety.     Follow Up Recommendations  Skilled nursing facility    Barriers to Discharge        Equipment Recommendations  Defer to next venue    Recommendations for Other Services    Frequency Min 3X/week   Plan Discharge plan remains appropriate    Precautions / Restrictions Precautions Precautions: Fall Restrictions Weight Bearing Restrictions: No   Pertinent Vitals/Pain     Mobility  Bed Mobility Bed Mobility: Sit to Supine Supine to Sit: 3: Mod assist Sit to Supine: 4: Min assist (then mod A to reposition in bed) Details for Bed Mobility Assistance: Assist for trunk to supine and Min A for bil LEs onto bed. VCs safety, technique.  Transfers Transfers: Stand to Sit;Sit to Stand Sit to Stand: 4: Min assist;From chair/3-in-1;With upper extremity assist;With armrests Stand to Sit: 4: Min assist;With armrests;With upper extremity assist;To bed Details for Transfer Assistance: VCs safety, technique, hand placement. Assist to rise, stabilize, control descent.  Ambulation/Gait Ambulation/Gait Assistance: 4: Min assist Ambulation Distance (Feet): 100 Feet Assistive device: Rolling walker Ambulation/Gait Assistance Details: VCS safety, distance from RW, to increase step length. Assist to stabilize and navigate with RW.  Gait Pattern: Step-through pattern;Decreased stride length;Decreased step length - right;Decreased step length - left    Exercises     PT Diagnosis:    PT Problem List:   PT Treatment Interventions:     PT Goals Acute Rehab PT Goals PT Goal: Sit to Supine/Side - Progress: Progressing toward goal PT Goal: Sit to Stand  - Progress: Progressing toward goal PT Goal: Stand to Sit - Progress: Progressing toward goal PT Goal: Ambulate - Progress: Progressing toward goal  Visit Information  Last PT Received On: 03/27/12 Assistance Needed: +1    Subjective Data  Subjective: "I feel good" Patient Stated Goal: None stated   Cognition  Overall Cognitive Status: History of cognitive impairments - at baseline Arousal/Alertness: Awake/alert Orientation Level: Appears intact for tasks assessed Behavior During Session: [facility] for tasks performed    Balance     End of Session PT - End of Session Equipment Utilized During Treatment: Gait belt Activity Tolerance: Patient tolerated treatment well Patient left: in bed;with call bell/phone within reach   GP     Rebeca Alert [place] 03/27/2012, 2:03 PM [phone]

## 2012-03-28 LAB — BASIC METABOLIC PANEL
BUN: 17 mg/dL (ref 6–23)
GFR calc non Af Amer: 54 mL/min — ABNORMAL LOW (ref >90)
Glucose, Bld: 97 mg/dL (ref 70–99)
Potassium: 4 mEq/L (ref 3.5–5.1)

## 2012-03-28 LAB — CBC
HCT: 27.3 % — ABNORMAL LOW (ref 39.0–52.0)
Hemoglobin: 9.1 g/dL — ABNORMAL LOW (ref 13.0–17.0)
MCH: 30.5 pg (ref 26.0–34.0)
MCHC: 33.3 g/dL (ref 30.0–36.0)

## 2012-03-28 MED ORDER — TRAMADOL HCL 50 MG PO TABS: 50.0000 mg | ORAL_TABLET | Freq: Four times a day (QID) | ORAL | Status: DC | PRN

## 2012-03-28 MED ORDER — MEMANTINE HCL 5 MG PO TABS: 5.0000 mg | ORAL_TABLET | Freq: Two times a day (BID) | ORAL | Status: DC

## 2012-03-28 MED ORDER — TAMSULOSIN HCL 0.4 MG PO CAPS: 0.4000 mg | ORAL_CAPSULE | Freq: Every day | ORAL | Status: DC

## 2012-03-28 MED ORDER — QUETIAPINE FUMARATE 50 MG PO TABS: 50.0000 mg | ORAL_TABLET | Freq: Every day | ORAL | Status: DC

## 2012-03-28 MED ORDER — LORAZEPAM 2 MG PO TABS: 1.0000 mg | ORAL_TABLET | Freq: Three times a day (TID) | ORAL | Status: DC | PRN

## 2012-03-28 MED ORDER — RIVASTIGMINE 4.6 MG/24HR TD PT24: 1.0000 | MEDICATED_PATCH | Freq: Every day | TRANSDERMAL | Status: DC

## 2012-03-28 MED ORDER — NICOTINE 21 MG/24HR TD PT24: 1.0000 | MEDICATED_PATCH | Freq: Every day | TRANSDERMAL | Status: AC

## 2012-03-28 NOTE — Progress Notes (Signed)
Clinical Social Work Department CLINICAL SOCIAL WORK PLACEMENT NOTE 03/28/2012  Patient:  RYANE, CANAVAN  Account Number:  [id number] Admit date:  03/24/2012  Clinical Social Worker:  Orpah Greek  Date/time:  03/27/2012 01:44 PM  Clinical Social Work is seeking post-discharge placement for this patient at the following level of care:   SKILLED NURSING   (*CSW will update this form in Epic as items are completed)   03/27/2012  Patient/family provided with [facility] Department of Clinical Social Work's list of facilities offering this level of care within the geographic area requested by the patient (or if unable, by the patient's family).  03/27/2012  Patient/family informed of their freedom to choose among providers that offer the needed level of care, that participate in Medicare, Medicaid or managed care program needed by the patient, have an available bed and are willing to accept the patient.  03/27/2012  Patient/family informed of [facility]' ownership interest in [facility], as well as of the fact that they are under no obligation to receive care at this facility.  PASARR submitted to EDS on 03/27/2012 PASARR number received from EDS on 03/27/2012  FL2 transmitted to all facilities in geographic area requested by pt/family on  03/27/2012 FL2 transmitted to all facilities within larger geographic area on   Patient informed that his/her managed care company has contracts with or will negotiate with  certain facilities, including the following:     Patient/family informed of bed offers received:  03/27/2012 Patient chooses bed at [facility] Physician recommends and patient chooses bed at    Patient to be transferred to [facility] on  03/28/2012 Patient to be transferred to facility by [facility]  The following physician request were entered in Epic:   Additional Comments:  [employer] provided prior approval for SNF  placement and Ambulance transport.  Cori Razor LCSW [phone]

## 2012-03-28 NOTE — Discharge Summary (Signed)
Physician Discharge Summary  Patient ID: Terry Flynn MRN: [id number] DOB/AGE: 1933/06/07 78 y.o.  Admit date: 03/24/2012 Discharge date: 03/28/2012  Primary Care Physician:  [name], MD   Discharge Diagnoses:    Principal Problem:  *ARF (acute renal failure)  Active Problems:   CORONARY ARTERY DISEASE   COPD UNSPECIFIED   Hyperkalemia   Metabolic acidosis   Dehydration   Anxiety   Dementia with behavioral disturbance   H/O: CVA (cerebrovascular accident)   Urinary retention    Recommendations for hospital follow-up: 1.  F/U BMET in 2-3 days to ensure creatinine remains at baseline; F/U CBC in 2-3 days to ensure hemoglobin stable. 2.  Consider resuming Valsartan for elevated BP (held at discharge) 3.  Titrate Seroquel up if needed for control of restless agitation. 4.  Continue to wean Ativan. 5.  Titrate Namenda to 10 mg BID in 1 week if tolerates current dose.    Discharge Medications:  Medication List  As of 03/28/2012  3:01 PM   STOP taking these medications         sulfamethoxazole-trimethoprim 800-160 MG per tablet      valsartan 320 MG tablet         TAKE these medications         acetaminophen 500 MG tablet   Commonly known as: TYLENOL   Take 1,000 mg by mouth every 6 (six) hours as needed.      albuterol 108 (90 BASE) MCG/ACT inhaler   Commonly known as: PROVENTIL HFA;VENTOLIN HFA   Inhale 2 puffs into the lungs every 4 (four) hours as needed for wheezing or shortness of breath. For breathing      amLODipine 10 MG tablet   Commonly known as: NORVASC   Take 10 mg by mouth daily.      clopidogrel 75 MG tablet   Commonly known as: PLAVIX   Take 75 mg by mouth daily.      LORazepam 2 MG tablet   Commonly known as: ATIVAN   Take 0.5 tablets (1 mg total) by mouth every 8 (eight) hours as needed. For anxiety      memantine 5 MG tablet   Commonly known as: NAMENDA   Take 1 tablet (5 mg total) by mouth 2 (two) times daily.     metoprolol succinate 25 MG 24 hr tablet   Commonly known as: TOPROL-XL   Take 25 mg by mouth daily.      nicotine 21 mg/24hr patch   Commonly known as: NICODERM CQ - dosed in mg/24 hours   Place 1 patch onto the skin daily.      PARoxetine 20 MG tablet   Commonly known as: PAXIL   Take 30 mg by mouth every morning.      QUEtiapine 50 MG tablet   Commonly known as: SEROQUEL   Take 1 tablet (50 mg total) by mouth at bedtime.      rivastigmine 4.6 mg/24hr   Commonly known as: EXELON   Place 1 patch (4.6 mg total) onto the skin daily.      Tamsulosin HCl 0.4 MG Caps   Commonly known as: FLOMAX   Take 1 capsule (0.4 mg total) by mouth daily.      traMADol 50 MG tablet   Commonly known as: ULTRAM   Take 1 tablet (50 mg total) by mouth every 6 (six) hours as needed. For pain      ULORIC 80 MG Tabs   Generic drug: Febuxostat  Take 40-80 mg by mouth daily. Can take 40-80 mg depending on the pain, if pain is severe he will take 80mg              Disposition and Follow-up: The patient is being discharged to a SNF for rehabilitation.  He is instructed to follow up with his PCP 1 week after discharge from rehab.  Medical Consultants:  Dr. Wonda Cerise, Psychiatry. Other consultants:  Physical therapy: SNF.  Occupational therapy  Procedures and Diagnostic Studies:   US Renal 03/24/2012  IMPRESSION: No hydronephrosis or obstruction.  Bilateral renal cysts. Tiny left upper pole parenchymal calcification.  Original Report Authenticated By: Andreas Newport, M.D.    Portable Chest 1 View 03/24/2012 IMPRESSION: Pulmonary vascular congestion with no signs of focal infiltrate or congestive failure.  Original Report Authenticated By: Bertha Stakes, M.D.    Discharge Laboratory Values: Basic Metabolic Panel:  Lab 03/28/12 1610 03/27/12 0415 03/26/12 0530 03/25/12 0526 03/24/12 1950  NA 138 141 140 136 136  K 4.0 3.9 -- -- --  CL 111 111 109 115* 116*  CO2 22 22 23  15* 13*  GLUCOSE  97 112* 117* 97 81  BUN 17 30* 48* 63* 63*  CREATININE 1.24 1.46* 1.88* 2.64* 3.02*  CALCIUM 7.8* 7.7* 8.0* 8.2* 8.3*  MG -- -- -- -- --  PHOS -- -- -- -- --   GFR Estimated Creatinine Clearance: 52.3 ml/min (by C-G formula based on Cr of 1.24). Liver Function Tests:  Lab 03/24/12 0556  AST 38*  ALT 34  ALKPHOS 84  BILITOT 0.1*  PROT 7.1  ALBUMIN 3.2*   CBC:  Lab 03/28/12 0420 03/27/12 0415 03/26/12 0530 03/25/12 0526 03/24/12 0556  WBC 5.1 4.3 4.7 3.1* 5.0  NEUTROABS -- -- -- -- --  HGB 9.1* 9.1* 10.0* 11.1* 12.6*  HCT 27.3* 27.6* 30.9* 34.9* 38.4*  MCV 91.6 91.1 92.8 95.1 92.3  PLT 123* 119* 124* 126* 170   Microbiology Recent Results (from the past 240 hour(s))  URINE CULTURE     Status: Normal   Collection Time   03/24/12  5:06 AM      Component Value Range Status Comment   Specimen Description URINE, CLEAN CATCH   Final    Special Requests Was on Septra as outpatient Normal   Final    Culture  Setup Time 03/25/2012 01:19   Final    Colony Count NO GROWTH   Final    Culture NO GROWTH   Final    Report Status 03/25/2012 FINAL   Final      Brief H and P: For complete details please refer to admission H and P, but in brief, Terry Flynn is an 78 y.o. male with a PMH of dementia, cardiovascular and cerebrovascular disease, who was brought to the hospital for evaluation of progressive weakness, decreased PO intake, and new onset of the inability to ambulate with a walker, per his usual baseline. On initial evaluation, he was found to be in ARF, with hyperkalemia and metabolic acidosis.   Physical Exam at Discharge: BP 119/74  Pulse 71  Temp 98.3 F (36.8 C) (Oral)  Resp 16  Ht 5\' 7"  (1.702 m)  Wt 86.183 kg (190 lb)  BMI 29.76 kg/m2  SpO2 97% Gen:  NAD Cardiovascular:  RRR, No M/R/G Respiratory: Lungs CTAB Gastrointestinal: Abdomen soft, NT/ND with normal active bowel sounds. Extremities: No C/E/C   Hospital Course:  Principal Problem:  *ARF (acute  renal failure)  Admitted and  placed on IVF  Likely from pre-renal (decreased PO intake) in the setting of ARB use and post renal causes.  Renal ultrasound negative for hydronephrosis, but post-void bladder scan showed significant urinary retention.  Foley placed, started Flomax 03/25/12. Voiding trial done 24 hours after starting Flomax with no further urinary retention.  Creatinine back to baseline (1.3). Active Problems:  Melena / Normocytic anemia  Initially kept on Plavix, Lovenox. These were held after patient noted black stools. Put on PPI.  Heme check stool ordered, but no further black stools or bowel movements.  Hemoglobin had a total 3 g drop since admission, but stable over past 24 hours, so no evidence of GIB at present. Would check CBC in 2-3 days to ensure hemoglobin in stable, or sooner if black stool noted. CORONARY ARTERY DISEASE  Maintained on Plavix, which was temporarily held due to complaints of black stool, OK to resume at discharge.  Continue beta blocker.  No complaints of chest pain.  12 lead EKG done on admission, reviewed, non-acute COPD UNSPECIFIED  Albuterol MDI PRN.  Vascular congestion noted on CXR, no evidence of PNA. Hyperkalemia  Resolved, status post Kayexalate x 3.   EKG non-acute. Tele d/c'd 03/26/12. Metabolic acidosis  Likely from ARF.  Continue IVF, acidosis resolved on bicarb drip, which was stopped 03/25/12.  Dehydration  Secondary to poor PO intake.  Hydrating. Anxiety  Continue PRN Ativan and Paxil.  Family requests psychiatric evaluation due to severe symptoms.  Psych evaluation performed 03/24/12 and re-evaluation done 03/27/12. Exelon, Namenda started per recommendations.  Seroquel titrated to 50 mg Q HS.  OK to titrate Namenda to 10 mg BID in 1 week, and to up titrate Seroquel if needed. TSH WNL. Dementia with behavioral disturbance  May benefit from anti-psychotics. Rested after being given Haldol. Started on Seroquel 03/25/12.  Continue  Seroquel Q HS. Family reports patient is much clearer and much less anxious.  PT/OT/SW evaluations done to determine appropriate discharge plan. PT/OT recommends SNF. Urinary retention  No hydronephrosis on ultrasound.  Post void residual checked, residual of 466 cc noted on admission. Foley placed.  Continue Flomax. Passed voiding trial. Foley subsequently discontinued. Urine cultures negative.   Discharge Instructions:  Diet:  Low sodium, heart healthy  Activity:  Increase activity slowly, walk with assistance with a walker.  Condition at Discharge:   Improved  Time spent on Discharge:  40 minutes  Signed: Dr. Trula Ore Rheanne Cortopassi Pager [phone] 03/28/2012, 3:01 PM

## 2012-03-28 NOTE — Progress Notes (Signed)
Discharge to [facility]. Patient is alert no complaints of any pain or discomfort, son at bedside.PIV removed no s/s of infiltration noted.Report given to [employer].

## 2012-03-28 NOTE — Progress Notes (Signed)
Patient discharge to [facility]. Picked up by [facility], Pt alert and oriented no distress upon discharge. PIV removed no s/s of infiltration or swelling noted.

## 2012-03-28 NOTE — Progress Notes (Signed)
Patient evaluated for long-term disease management services with [facility] Care Management Program as a benefit of his [employer]. Appears dc plan is SNF will continue to follow.  Raiford Noble, RN, MSN-Ed, BSN [facility] Liaison [phone]

## 2012-05-16 ENCOUNTER — Ambulatory Visit (INDEPENDENT_AMBULATORY_CARE_PROVIDER_SITE_OTHER): Payer: Medicare Other | Admitting: Cardiology

## 2012-05-16 ENCOUNTER — Encounter: Payer: Self-pay | Admitting: Adult Health

## 2012-05-16 VITALS — BP 130/64 | HR 60 | Ht 67.0 in | Wt 176.1 lb

## 2012-05-16 DIAGNOSIS — F17200 Nicotine dependence, unspecified, uncomplicated: Secondary | ICD-10-CM

## 2012-05-16 DIAGNOSIS — I10 Essential (primary) hypertension: Secondary | ICD-10-CM

## 2012-05-16 DIAGNOSIS — F09 Unspecified mental disorder due to known physiological condition: Secondary | ICD-10-CM

## 2012-05-16 DIAGNOSIS — I6350 Cerebral infarction due to unspecified occlusion or stenosis of unspecified cerebral artery: Secondary | ICD-10-CM

## 2012-05-16 DIAGNOSIS — N200 Calculus of kidney: Secondary | ICD-10-CM

## 2012-05-16 DIAGNOSIS — J4489 Other specified chronic obstructive pulmonary disease: Secondary | ICD-10-CM

## 2012-05-16 DIAGNOSIS — N189 Chronic kidney disease, unspecified: Secondary | ICD-10-CM

## 2012-05-16 DIAGNOSIS — I7090 Unspecified atherosclerosis: Secondary | ICD-10-CM

## 2012-05-16 DIAGNOSIS — J449 Chronic obstructive pulmonary disease, unspecified: Secondary | ICD-10-CM

## 2012-05-16 DIAGNOSIS — I639 Cerebral infarction, unspecified: Secondary | ICD-10-CM

## 2012-05-16 DIAGNOSIS — I2510 Atherosclerotic heart disease of native coronary artery without angina pectoris: Secondary | ICD-10-CM | POA: Insufficient documentation

## 2012-05-16 DIAGNOSIS — R4189 Other symptoms and signs involving cognitive functions and awareness: Secondary | ICD-10-CM

## 2012-05-16 LAB — CBC
MCH: 30.5 pg (ref 26.0–34.0)
MCV: 88.8 fL (ref 78.0–100.0)
Platelets: 187 10*3/uL (ref 150–400)
RBC: 4.1 MIL/uL — ABNORMAL LOW (ref 4.22–5.81)
RDW: 13.7 % (ref 11.5–15.5)

## 2012-05-16 LAB — COMPREHENSIVE METABOLIC PANEL
ALT: 8 U/L (ref 0–53)
Albumin: 4 g/dL (ref 3.5–5.2)
Alkaline Phosphatase: 83 U/L (ref 39–117)
CO2: 26 mEq/L (ref 19–32)
Glucose, Bld: 92 mg/dL (ref 70–99)
Potassium: 5.2 mEq/L (ref 3.5–5.3)
Sodium: 141 mEq/L (ref 135–145)
Total Protein: 7.1 g/dL (ref 6.0–8.3)

## 2012-05-16 LAB — LIPID PANEL
Cholesterol: 194 mg/dL (ref 0–200)
Total CHOL/HDL Ratio: 5.2 Ratio

## 2012-05-16 MED ORDER — IPRATROPIUM-ALBUTEROL 20-100 MCG/ACT IN AERS: 2.0000 | INHALATION_SPRAY | Freq: Three times a day (TID) | RESPIRATORY_TRACT | Status: DC

## 2012-05-16 NOTE — Assessment & Plan Note (Signed)
Chronic kidney disease has been mild and stable.  We will continue to monitor.

## 2012-05-16 NOTE — Assessment & Plan Note (Signed)
Patient congratulated on abstinence from tobacco use and encouraged to continue.

## 2012-05-16 NOTE — Patient Instructions (Addendum)
Your physician recommends that you schedule a follow-up appointment in: 1 year  Your physician has recommended you make the following change in your medication:  1 - STOP Metoprolol 2 - START Combivent 2 puffs three times a day 3 - May use over the counter Guaifenesin as directed on the bottle  Your physician recommends that you return for lab work in: Today

## 2012-05-16 NOTE — Progress Notes (Deleted)
Name: FRAZER RAINVILLE    DOB: August 02, 1933  Age: 78 y.o.  MR#: [id number]       PCP:  [name], MD      Insurance: @PAYORNAME @   CC:   No chief complaint on file.   VS BP 130/64  Pulse 60  Ht 5\' 7"  (1.702 m)  Wt 176 lb 1.9 oz (79.888 kg)  BMI 27.58 kg/m2  Weights Current Weight  05/16/12 176 lb 1.9 oz (79.888 kg)  03/24/12 190 lb (86.183 kg)  02/23/12 207 lb 8 oz (94.121 kg)    Blood Pressure  BP Readings from Last 3 Encounters:  05/16/12 130/64  03/28/12 119/74  02/26/12 128/71     Admit date:  (Not on file) Last encounter with RMR:  Visit date not found   Allergy Allergies  Allergen Reactions  . Statins Other (See Comments)    REACTION: MYALGIA    Current Outpatient Prescriptions  Medication Sig Dispense Refill  . acetaminophen (TYLENOL) 500 MG tablet Take 1,000 mg by mouth every 6 (six) hours as needed.      Marland Kitchen albuterol (PROAIR HFA) 108 (90 BASE) MCG/ACT inhaler Inhale 2 puffs into the lungs every 4 (four) hours as needed for wheezing or shortness of breath. For breathing  1 Inhaler  1  . amLODipine (NORVASC) 10 MG tablet Take 10 mg by mouth daily.        . clopidogrel (PLAVIX) 75 MG tablet Take 75 mg by mouth daily.      . Febuxostat (ULORIC) 80 MG TABS Take 40-80 mg by mouth daily. Can take 40-80 mg depending on the pain, if pain is severe he will take 80mg       . LORazepam (ATIVAN) 2 MG tablet Take 0.5 tablets (1 mg total) by mouth every 8 (eight) hours as needed. For anxiety  30 tablet  0  . memantine (NAMENDA) 5 MG tablet Take 10 mg by mouth 2 (two) times daily.      . metoprolol succinate (TOPROL-XL) 25 MG 24 hr tablet Take 25 mg by mouth daily.        Marland Kitchen PARoxetine (PAXIL) 20 MG tablet Take 30 mg by mouth every morning.       Marland Kitchen QUEtiapine (SEROQUEL) 50 MG tablet Take 1 tablet (50 mg total) by mouth at bedtime.      . rivastigmine (EXELON) 4.6 mg/24hr Place 1 patch (4.6 mg total) onto the skin daily.  30 patch    . Tamsulosin HCl (FLOMAX) 0.4 MG CAPS Take 1  capsule (0.4 mg total) by mouth daily.  30 capsule    . traMADol (ULTRAM) 50 MG tablet Take 1 tablet (50 mg total) by mouth every 6 (six) hours as needed. For pain  30 tablet  0  . DISCONTD: memantine (NAMENDA) 5 MG tablet Take 1 tablet (5 mg total) by mouth 2 (two) times daily.        Discontinued Meds:    Medications Discontinued During This Encounter  Medication Reason  . memantine (NAMENDA) 5 MG tablet     Patient Active Problem List  Diagnosis  . DYSLIPIDEMIA  . TOBACCO ABUSE  . HYPERTENSION  . CORONARY ARTERY DISEASE  . VENTRICULAR TACHYCARDIA  . COPD UNSPECIFIED  . INGUINAL HERNIA  . NEPHROLITHIASIS  . BACK PAIN  . WEIGHT GAIN, ABNORMAL  . DYSPNEA  . GERD (gastroesophageal reflux disease)  . Headache  . GERD  . Hypercholesterolemia  . Frequent falls  . CVA (cerebral infarction)  . Community acquired  pneumonia  . COPD exacerbation  . Dementia  . ARF (acute renal failure)  . Hyperkalemia  . Metabolic acidosis  . Dehydration  . Anxiety  . Dementia with behavioral disturbance  . H/O: CVA (cerebrovascular accident)  . Urinary retention    LABS Admission on 03/24/2012, Discharged on 03/28/2012  Component Date Value  . WBC 03/24/2012 5.0   . RBC 03/24/2012 4.16*  . Hemoglobin 03/24/2012 12.6*  . HCT 03/24/2012 38.4*  . MCV 03/24/2012 92.3   . [facility] 03/24/2012 30.3   . MCHC 03/24/2012 32.8   . RDW 03/24/2012 13.8   . Platelets 03/24/2012 170   . Sodium 03/24/2012 134*  . Potassium 03/24/2012 6.2*  . Chloride 03/24/2012 106   . CO2 03/24/2012 16*  . Glucose, Bld 03/24/2012 88   . BUN 03/24/2012 73*  . Creatinine, Ser 03/24/2012 3.81*  . Calcium 03/24/2012 8.8   . GFR calc non Af Amer 03/24/2012 14*  . GFR calc Af Amer 03/24/2012 16*  . Color, Urine 03/24/2012 YELLOW   . APPearance 03/24/2012 CLOUDY*  . Specific Gravity, Urine 03/24/2012 1.020   . pH 03/24/2012 5.5   . Glucose, UA 03/24/2012 NEGATIVE   . Hgb urine dipstick 03/24/2012 NEGATIVE   .  Bilirubin Urine 03/24/2012 SMALL*  . Ketones, ur 03/24/2012 NEGATIVE   . Protein, ur 03/24/2012 NEGATIVE   . Urobilinogen, UA 03/24/2012 0.2   . Nitrite 03/24/2012 NEGATIVE   . Leukocytes, UA 03/24/2012 NEGATIVE   . Total Protein 03/24/2012 7.1   . Albumin 03/24/2012 3.2*  . AST 03/24/2012 38*  . ALT 03/24/2012 34   . Alkaline Phosphatase 03/24/2012 84   . Total Bilirubin 03/24/2012 0.1*  . Bilirubin, Direct 03/24/2012 <0.1   . Indirect Bilirubin 03/24/2012 NOT CALCULATED   . Specimen Description 03/24/2012 URINE, CLEAN CATCH   . Special Requests 03/24/2012 Was on Septra as outpatient Normal   . Culture  Setup Time 03/24/2012 03/25/2012 01:19   . Colony Count 03/24/2012 NO GROWTH   . Culture 03/24/2012 NO GROWTH   . Report Status 03/24/2012 03/25/2012 FINAL   . Sodium 03/24/2012 136   . Potassium 03/24/2012 6.7*  . Chloride 03/24/2012 116*  . CO2 03/24/2012 13*  . Glucose, Bld 03/24/2012 81   . BUN 03/24/2012 63*  . Creatinine, Ser 03/24/2012 3.02*  . Calcium 03/24/2012 8.3*  . GFR calc non Af Amer 03/24/2012 19*  . GFR calc Af Amer 03/24/2012 21*  . TSH 03/24/2012 1.076   . Sodium 03/25/2012 136   . Potassium 03/25/2012 5.9*  . Chloride 03/25/2012 115*  . CO2 03/25/2012 15*  . Glucose, Bld 03/25/2012 97   . BUN 03/25/2012 63*  . Creatinine, Ser 03/25/2012 2.64*  . Calcium 03/25/2012 8.2*  . GFR calc non Af Amer 03/25/2012 22*  . GFR calc Af Amer 03/25/2012 25*  . WBC 03/25/2012 3.1*  . RBC 03/25/2012 3.67*  . Hemoglobin 03/25/2012 11.1*  . HCT 03/25/2012 34.9*  . MCV 03/25/2012 95.1   . [facility] 03/25/2012 30.2   . MCHC 03/25/2012 31.8   . RDW 03/25/2012 14.2   . Platelets 03/25/2012 126*  . Pro B Natriuretic peptid* 03/26/2012 375.0   . WBC 03/26/2012 4.7   . RBC 03/26/2012 3.33*  . Hemoglobin 03/26/2012 10.0*  . HCT 03/26/2012 30.9*  . MCV 03/26/2012 92.8   . [facility] 03/26/2012 30.0   . MCHC 03/26/2012 32.4   . RDW 03/26/2012 14.1   . Platelets 03/26/2012 124*  .  Sodium 03/26/2012 140   .  Potassium 03/26/2012 4.5   . Chloride 03/26/2012 109   . CO2 03/26/2012 23   . Glucose, Bld 03/26/2012 117*  . BUN 03/26/2012 48*  . Creatinine, Ser 03/26/2012 1.88*  . Calcium 03/26/2012 8.0*  . GFR calc non Af Amer 03/26/2012 33*  . GFR calc Af Amer 03/26/2012 38*  . Sodium 03/27/2012 141   . Potassium 03/27/2012 3.9   . Chloride 03/27/2012 111   . CO2 03/27/2012 22   . Glucose, Bld 03/27/2012 112*  . BUN 03/27/2012 30*  . Creatinine, Ser 03/27/2012 1.46*  . Calcium 03/27/2012 7.7*  . GFR calc non Af Amer 03/27/2012 45*  . GFR calc Af Amer 03/27/2012 52*  . WBC 03/27/2012 4.3   . RBC 03/27/2012 3.03*  . Hemoglobin 03/27/2012 9.1*  . HCT 03/27/2012 27.6*  . MCV 03/27/2012 91.1   . [facility] 03/27/2012 30.0   . MCHC 03/27/2012 33.0   . RDW 03/27/2012 14.2   . Platelets 03/27/2012 119*  . Sodium 03/28/2012 138   . Potassium 03/28/2012 4.0   . Chloride 03/28/2012 111   . CO2 03/28/2012 22   . Glucose, Bld 03/28/2012 97   . BUN 03/28/2012 17   . Creatinine, Ser 03/28/2012 1.24   . Calcium 03/28/2012 7.8*  . GFR calc non Af Amer 03/28/2012 54*  . GFR calc Af Amer 03/28/2012 63*  . WBC 03/28/2012 5.1   . RBC 03/28/2012 2.98*  . Hemoglobin 03/28/2012 9.1*  . HCT 03/28/2012 27.3*  . MCV 03/28/2012 91.6   . [facility] 03/28/2012 30.5   . MCHC 03/28/2012 33.3   . RDW 03/28/2012 14.2   . Platelets 03/28/2012 123*  Admission on 02/23/2012, Discharged on 02/26/2012  Component Date Value  . WBC 02/23/2012 9.1   . RBC 02/23/2012 4.62   . Hemoglobin 02/23/2012 14.4   . HCT 02/23/2012 44.3   . MCV 02/23/2012 95.9   . MCH 02/23/2012 31.2   . MCHC 02/23/2012 32.5   . RDW 02/23/2012 14.1   . Platelets 02/23/2012 154   . Neutrophils Relative 02/23/2012 58   . Neutro Abs 02/23/2012 5.3   . Lymphocytes Relative 02/23/2012 29   . Lymphs Abs 02/23/2012 2.6   . Monocytes Relative 02/23/2012 9   . Monocytes Absolute 02/23/2012 0.8   . Eosinophils Relative  02/23/2012 4   . Eosinophils Absolute 02/23/2012 0.3   . Basophils Relative 02/23/2012 0   . Basophils Absolute 02/23/2012 0.0   . Sodium 02/23/2012 138   . Potassium 02/23/2012 4.8   . Chloride 02/23/2012 105   . CO2 02/23/2012 23   . Glucose, Bld 02/23/2012 77   . BUN 02/23/2012 29*  . Creatinine, Ser 02/23/2012 1.44*  . Calcium 02/23/2012 9.1   . Total Protein 02/23/2012 7.1   . Albumin 02/23/2012 3.4*  . AST 02/23/2012 11   . ALT 02/23/2012 11   . Alkaline Phosphatase 02/23/2012 79   . Total Bilirubin 02/23/2012 0.1*  . GFR calc non Af Amer 02/23/2012 45*  . GFR calc Af Amer 02/23/2012 53*  . Total CK 02/23/2012 26   . CK, MB 02/23/2012 1.3   . Troponin I 02/23/2012 <0.30   . Relative Index 02/23/2012 RELATIVE INDEX IS INVALID   . Prothrombin Time 02/23/2012 12.4   . INR 02/23/2012 0.91   . Color, Urine 02/23/2012 YELLOW   . APPearance 02/23/2012 CLEAR   . Specific Gravity, Urine 02/23/2012 1.025   . pH 02/23/2012 5.0   . Glucose, UA 02/23/2012 NEGATIVE   .  Hgb urine dipstick 02/23/2012 NEGATIVE   . Bilirubin Urine 02/23/2012 SMALL*  . Ketones, ur 02/23/2012 NEGATIVE   . Protein, ur 02/23/2012 NEGATIVE   . Urobilinogen, UA 02/23/2012 0.2   . Nitrite 02/23/2012 NEGATIVE   . Leukocytes, UA 02/23/2012 NEGATIVE   . Sodium 02/24/2012 137   . Potassium 02/24/2012 4.8   . Chloride 02/24/2012 104   . CO2 02/24/2012 17*  . Glucose, Bld 02/24/2012 154*  . BUN 02/24/2012 28*  . Creatinine, Ser 02/24/2012 1.35   . Calcium 02/24/2012 9.1   . Total Protein 02/24/2012 7.3   . Albumin 02/24/2012 3.4*  . AST 02/24/2012 13   . ALT 02/24/2012 11   . Alkaline Phosphatase 02/24/2012 78   . Total Bilirubin 02/24/2012 0.2*  . GFR calc non Af Amer 02/24/2012 49*  . GFR calc Af Amer 02/24/2012 57*  . WBC 02/24/2012 7.7   . RBC 02/24/2012 4.73   . Hemoglobin 02/24/2012 14.6   . HCT 02/24/2012 45.2   . MCV 02/24/2012 95.6   . [facility] 02/24/2012 30.9   . MCHC 02/24/2012 32.3   . RDW  02/24/2012 13.8   . Platelets 02/24/2012 157   . TSH 02/24/2012 1.026   . WBC 02/23/2012 8.4   . RBC 02/23/2012 4.51   . Hemoglobin 02/23/2012 14.1   . HCT 02/23/2012 42.9   . MCV 02/23/2012 95.1   . [facility] 02/23/2012 31.3   . MCHC 02/23/2012 32.9   . RDW 02/23/2012 13.9   . Platelets 02/23/2012 142*  . Creatinine, Ser 02/23/2012 1.38*  . GFR calc non Af Amer 02/23/2012 48*  . GFR calc Af Amer 02/23/2012 55*  . Glucose-Capillary 02/24/2012 73   . Comment 1 02/24/2012 Repeat Test   . Sodium 02/25/2012 136   . Potassium 02/25/2012 5.0   . Chloride 02/25/2012 103   . CO2 02/25/2012 21   . Glucose, Bld 02/25/2012 92   . BUN 02/25/2012 32*  . Creatinine, Ser 02/25/2012 1.30   . Calcium 02/25/2012 9.2   . GFR calc non Af Amer 02/25/2012 51*  . GFR calc Af Amer 02/25/2012 59*     Results for this Opt Visit:     Results for orders placed during the hospital encounter of 03/24/12  CBC      Component Value Range   WBC 5.0  4.0 - 10.5 K/uL   RBC 4.16 (*) 4.22 - 5.81 MIL/uL   Hemoglobin 12.6 (*) 13.0 - 17.0 g/dL   HCT 16.1 (*) 09.6 - 04.5 %   MCV 92.3  78.0 - 100.0 fL   MCH 30.3  26.0 - 34.0 pg   MCHC 32.8  30.0 - 36.0 g/dL   RDW 40.9  81.1 - 91.4 %   Platelets 170  150 - 400 K/uL  BASIC METABOLIC PANEL      Component Value Range   Sodium 134 (*) 135 - 145 mEq/L   Potassium 6.2 (*) 3.5 - 5.1 mEq/L   Chloride 106  96 - 112 mEq/L   CO2 16 (*) 19 - 32 mEq/L   Glucose, Bld 88  70 - 99 mg/dL   BUN 73 (*) 6 - 23 mg/dL   Creatinine, Ser 7.82 (*) 0.50 - 1.35 mg/dL   Calcium 8.8  8.4 - 95.6 mg/dL   GFR calc non Af Amer 14 (*) >90 mL/min   GFR calc Af Amer 16 (*) >90 mL/min  URINALYSIS, ROUTINE W REFLEX MICROSCOPIC      Component Value Range  Color, Urine YELLOW  YELLOW   APPearance CLOUDY (*) CLEAR   Specific Gravity, Urine 1.020  1.005 - 1.030   pH 5.5  5.0 - 8.0   Glucose, UA NEGATIVE  NEGATIVE mg/dL   Hgb urine dipstick NEGATIVE  NEGATIVE   Bilirubin Urine SMALL (*)  NEGATIVE   Ketones, ur NEGATIVE  NEGATIVE mg/dL   Protein, ur NEGATIVE  NEGATIVE mg/dL   Urobilinogen, UA 0.2  0.0 - 1.0 mg/dL   Nitrite NEGATIVE  NEGATIVE   Leukocytes, UA NEGATIVE  NEGATIVE  HEPATIC FUNCTION PANEL      Component Value Range   Total Protein 7.1  6.0 - 8.3 g/dL   Albumin 3.2 (*) 3.5 - 5.2 g/dL   AST 38 (*) 0 - 37 U/L   ALT 34  0 - 53 U/L   Alkaline Phosphatase 84  39 - 117 U/L   Total Bilirubin 0.1 (*) 0.3 - 1.2 mg/dL   Bilirubin, Direct <0.8  0.0 - 0.3 mg/dL   Indirect Bilirubin NOT CALCULATED  0.3 - 0.9 mg/dL  URINE CULTURE      Component Value Range   Specimen Description URINE, CLEAN CATCH     Special Requests Was on Septra as outpatient Normal     Culture  Setup Time 03/25/2012 01:19     Colony Count NO GROWTH     Culture NO GROWTH     Report Status 03/25/2012 FINAL    BASIC METABOLIC PANEL      Component Value Range   Sodium 136  135 - 145 mEq/L   Potassium 6.7 (*) 3.5 - 5.1 mEq/L   Chloride 116 (*) 96 - 112 mEq/L   CO2 13 (*) 19 - 32 mEq/L   Glucose, Bld 81  70 - 99 mg/dL   BUN 63 (*) 6 - 23 mg/dL   Creatinine, Ser 6.57 (*) 0.50 - 1.35 mg/dL   Calcium 8.3 (*) 8.4 - 10.5 mg/dL   GFR calc non Af Amer 19 (*) >90 mL/min   GFR calc Af Amer 21 (*) >90 mL/min  TSH      Component Value Range   TSH 1.076  0.350 - 4.500 uIU/mL  BASIC METABOLIC PANEL      Component Value Range   Sodium 136  135 - 145 mEq/L   Potassium 5.9 (*) 3.5 - 5.1 mEq/L   Chloride 115 (*) 96 - 112 mEq/L   CO2 15 (*) 19 - 32 mEq/L   Glucose, Bld 97  70 - 99 mg/dL   BUN 63 (*) 6 - 23 mg/dL   Creatinine, Ser 8.46 (*) 0.50 - 1.35 mg/dL   Calcium 8.2 (*) 8.4 - 10.5 mg/dL   GFR calc non Af Amer 22 (*) >90 mL/min   GFR calc Af Amer 25 (*) >90 mL/min  CBC      Component Value Range   WBC 3.1 (*) 4.0 - 10.5 K/uL   RBC 3.67 (*) 4.22 - 5.81 MIL/uL   Hemoglobin 11.1 (*) 13.0 - 17.0 g/dL   HCT 96.2 (*) 95.2 - 84.1 %   MCV 95.1  78.0 - 100.0 fL   MCH 30.2  26.0 - 34.0 pg   MCHC 31.8   30.0 - 36.0 g/dL   RDW 32.4  40.1 - 02.7 %   Platelets 126 (*) 150 - 400 K/uL  PRO B NATRIURETIC PEPTIDE      Component Value Range   Pro B Natriuretic peptide (BNP) 375.0  0 - 450 pg/mL  CBC  Component Value Range   WBC 4.7  4.0 - 10.5 K/uL   RBC 3.33 (*) 4.22 - 5.81 MIL/uL   Hemoglobin 10.0 (*) 13.0 - 17.0 g/dL   HCT 16.1 (*) 09.6 - 04.5 %   MCV 92.8  78.0 - 100.0 fL   MCH 30.0  26.0 - 34.0 pg   MCHC 32.4  30.0 - 36.0 g/dL   RDW 40.9  81.1 - 91.4 %   Platelets 124 (*) 150 - 400 K/uL  BASIC METABOLIC PANEL      Component Value Range   Sodium 140  135 - 145 mEq/L   Potassium 4.5  3.5 - 5.1 mEq/L   Chloride 109  96 - 112 mEq/L   CO2 23  19 - 32 mEq/L   Glucose, Bld 117 (*) 70 - 99 mg/dL   BUN 48 (*) 6 - 23 mg/dL   Creatinine, Ser 7.82 (*) 0.50 - 1.35 mg/dL   Calcium 8.0 (*) 8.4 - 10.5 mg/dL   GFR calc non Af Amer 33 (*) >90 mL/min   GFR calc Af Amer 38 (*) >90 mL/min  BASIC METABOLIC PANEL      Component Value Range   Sodium 141  135 - 145 mEq/L   Potassium 3.9  3.5 - 5.1 mEq/L   Chloride 111  96 - 112 mEq/L   CO2 22  19 - 32 mEq/L   Glucose, Bld 112 (*) 70 - 99 mg/dL   BUN 30 (*) 6 - 23 mg/dL   Creatinine, Ser 9.56 (*) 0.50 - 1.35 mg/dL   Calcium 7.7 (*) 8.4 - 10.5 mg/dL   GFR calc non Af Amer 45 (*) >90 mL/min   GFR calc Af Amer 52 (*) >90 mL/min  CBC      Component Value Range   WBC 4.3  4.0 - 10.5 K/uL   RBC 3.03 (*) 4.22 - 5.81 MIL/uL   Hemoglobin 9.1 (*) 13.0 - 17.0 g/dL   HCT 21.3 (*) 08.6 - 57.8 %   MCV 91.1  78.0 - 100.0 fL   MCH 30.0  26.0 - 34.0 pg   MCHC 33.0  30.0 - 36.0 g/dL   RDW 46.9  62.9 - 52.8 %   Platelets 119 (*) 150 - 400 K/uL  BASIC METABOLIC PANEL      Component Value Range   Sodium 138  135 - 145 mEq/L   Potassium 4.0  3.5 - 5.1 mEq/L   Chloride 111  96 - 112 mEq/L   CO2 22  19 - 32 mEq/L   Glucose, Bld 97  70 - 99 mg/dL   BUN 17  6 - 23 mg/dL   Creatinine, Ser 4.13  0.50 - 1.35 mg/dL   Calcium 7.8 (*) 8.4 - 10.5 mg/dL   GFR  calc non Af Amer 54 (*) >90 mL/min   GFR calc Af Amer 63 (*) >90 mL/min  CBC      Component Value Range   WBC 5.1  4.0 - 10.5 K/uL   RBC 2.98 (*) 4.22 - 5.81 MIL/uL   Hemoglobin 9.1 (*) 13.0 - 17.0 g/dL   HCT 24.4 (*) 01.0 - 27.2 %   MCV 91.6  78.0 - 100.0 fL   MCH 30.5  26.0 - 34.0 pg   MCHC 33.3  30.0 - 36.0 g/dL   RDW 53.6  64.4 - 03.4 %   Platelets 123 (*) 150 - 400 K/uL    EKG Orders placed during the hospital encounter of 03/24/12  .  ED EKG  . ED EKG  . EKG 12-LEAD  . EKG 12-LEAD  . EKG     Prior Assessment and Plan Problem List as of 05/16/2012            Cardiology Problems   Hypercholesterolemia   DYSLIPIDEMIA   Last Assessment & Plan Note   11/25/2010 Documentation Signed 11/25/2010  2:34 PM by [name], MD    Good control  With diet  Labs with pirmary.  No statins due to myalgias    HYPERTENSION   Last Assessment & Plan Note   06/01/2011 Office Visit Signed 06/01/2011 11:21 AM by Jodelle Gross, NP    Blood pressure is well controlled at present.  No changes in medications. He is due to see Dr. Christell Constant this month. He is to have labs drawn at that time.  Will request copy for evaluation of kidney fx on multiple medications.    CORONARY ARTERY DISEASE   Last Assessment & Plan Note   06/01/2011 Office Visit Signed 06/01/2011 11:20 AM by Jodelle Gross, NP    He is doing very well without cardiac complaint.  Denies any chest pain or exertional discomfort.  His breathing status is sometimes compromised secondary to COPD and this causes some outside heat intolerance. Otherwise is doing well. I will make no changes at this time. He is to call for any changes or concerns.    VENTRICULAR TACHYCARDIA     Other   Dementia   Anxiety   TOBACCO ABUSE   COPD UNSPECIFIED   INGUINAL HERNIA   NEPHROLITHIASIS   BACK PAIN   WEIGHT GAIN, ABNORMAL   DYSPNEA   GERD (gastroesophageal reflux disease)   Last Assessment & Plan Note   11/25/2010 Documentation Signed  11/25/2010  2:36 PM by [name], MD    OTC maalox and pepcid  F/U Dr Christell Constant    Headache   Last Assessment & Plan Note   11/25/2010 Documentation Signed 11/25/2010  2:38 PM by [name], MD    Nonfocal exam with improved BP  Eye doctor tomorrow to R/O glaucoma  And F/U Dr Christell Constant    GERD   Frequent falls   CVA (cerebral infarction)   Community acquired pneumonia   COPD exacerbation   ARF (acute renal failure)   Hyperkalemia   Metabolic acidosis   Dehydration   Dementia with behavioral disturbance   H/O: CVA (cerebrovascular accident)   Urinary retention       Imaging: No results found.   FRS Calculation: Score not calculated. Missing: Total Cholesterol

## 2012-05-16 NOTE — Assessment & Plan Note (Signed)
Control of blood pressure has been excellent with minimal therapy.  Metoprolol, although being used in low dose, might be contributing to bronchospasm and will be discontinued.  An alternative agent will be started, if necessary.

## 2012-05-16 NOTE — Assessment & Plan Note (Signed)
Patient has been asymptomatic with respect coronary disease for the past decade.  We will continue optimal management of cardiovascular risk factors.

## 2012-05-16 NOTE — Assessment & Plan Note (Addendum)
Findings of chronic obstructive pulmonary disease are apparent on exam and patient is symptomatic with respect to lung disease.  A Combivent inhaler will be added to his medical regime.

## 2012-05-16 NOTE — Assessment & Plan Note (Addendum)
Decreased blood pressure in the right arm with asymptomatic subclavian steal.

## 2012-05-16 NOTE — Progress Notes (Signed)
Patient ID: REBECCA MOTTA, male   DOB: 1933-01-10, 78 y.o.   MRN: [id number]  HPI: Scheduled return visit for this nice gentleman with coronary artery disease and cardiovascular risk factors.  Generally, he has been doing well, but was admitted to hospital a few months ago with dehydration and acute on chronic renal failure.  He responded well to conservative medical treatment.  He discontinued cigarette smoking in 03/2012, but still experiences class 2-3 dyspnea on exertion.  He has had no chest discomfort, and blood pressure control has been good as far as he knows.  Prior to Admission medications   Medication Sig Start Date End Date Taking? Authorizing Provider  acetaminophen (TYLENOL) 500 MG tablet Take 1,000 mg by mouth every 6 (six) hours as needed.   Yes Historical Provider, MD  albuterol (PROAIR HFA) 108 (90 BASE) MCG/ACT inhaler Inhale 2 puffs into the lungs every 4 (four) hours as needed for wheezing or shortness of breath. For breathing 02/26/12  Yes [place] Cyndie Mull, MD  amLODipine (NORVASC) 10 MG tablet Take 10 mg by mouth daily.     Yes Historical Provider, MD  clopidogrel (PLAVIX) 75 MG tablet Take 75 mg by mouth daily.   Yes Historical Provider, MD  Febuxostat (ULORIC) 80 MG TABS Take 40-80 mg by mouth daily. Can take 40-80 mg depending on the pain, if pain is severe he will take 80mg    Yes Historical Provider, MD  LORazepam (ATIVAN) 2 MG tablet Take 0.5 tablets (1 mg total) by mouth every 8 (eight) hours as needed. For anxiety 03/28/12  Yes Maryruth Bun Rama, MD  memantine (NAMENDA) 5 MG tablet Take 10 mg by mouth 2 (two) times daily. 03/28/12 03/28/13 Yes Christina P Rama, MD  PARoxetine (PAXIL) 20 MG tablet Take 30 mg by mouth every morning.    Yes Historical Provider, MD  QUEtiapine (SEROQUEL) 50 MG tablet Take 1 tablet (50 mg total) by mouth at bedtime. 03/28/12 06/15/12 Yes Christina P Rama, MD  rivastigmine (EXELON) 4.6 mg/24hr Place 1 patch (4.6 mg total) onto the skin daily. 03/28/12  03/28/13 Yes Christina P Rama, MD  Tamsulosin HCl (FLOMAX) 0.4 MG CAPS Take 1 capsule (0.4 mg total) by mouth daily. 03/28/12  Yes Christina P Rama, MD  traMADol (ULTRAM) 50 MG tablet Take 1 tablet (50 mg total) by mouth every 6 (six) hours as needed. For pain 03/28/12  Yes Christina P Rama, MD  Ipratropium-Albuterol (COMBIVENT) 20-100 MCG/ACT AERS respimat Inhale 2 puffs into the lungs 3 (three) times daily. 05/16/12   Kathlen Brunswick, MD   Allergies  Allergen Reactions  . Statins Other (See Comments)    REACTION: MYALGIA     Past medical history, social history, and family history reviewed and updated.  ROS: Denies orthopnea, PND, palpitations, lightheadedness or syncope.  He notes occasional wheezing and has a productive morning cough.  All other systems reviewed and are negative.  PHYSICAL EXAM: BP 130/64  Pulse 60  Ht 5\' 7"  (1.702 m)  Wt 79.888 kg (176 lb 1.9 oz)  BMI 27.58 kg/m2  General-Well developed; no acute distress Body habitus-proportionate weight and height Neck-No JVD; no carotid bruits Lungs-Inspiratory and expiratory rhonchi; resonant to percussion; prolonged expiratory phase Cardiovascular-normal PMI; normal S1 and S2 Abdomen-normal bowel sounds; soft and non-tender without masses or organomegaly Musculoskeletal-No deformities, no cyanosis or clubbing Neurologic-Normal cranial nerves; symmetric strength and tone Skin-Warm, no significant lesions Extremities-distal pulses intact; 1/2+ edema  ASSESSMENT AND PLAN:  [place] Bing, MD 05/16/2012 12:38 PM

## 2012-05-20 ENCOUNTER — Encounter: Payer: Self-pay | Admitting: Cardiology

## 2012-05-20 DIAGNOSIS — D649 Anemia, unspecified: Secondary | ICD-10-CM | POA: Insufficient documentation

## 2012-05-20 DIAGNOSIS — E785 Hyperlipidemia, unspecified: Secondary | ICD-10-CM | POA: Insufficient documentation

## 2012-05-23 ENCOUNTER — Encounter: Payer: Self-pay | Admitting: *Deleted

## 2012-05-23 ENCOUNTER — Other Ambulatory Visit: Payer: Self-pay | Admitting: *Deleted

## 2012-05-23 DIAGNOSIS — E875 Hyperkalemia: Secondary | ICD-10-CM

## 2012-06-23 ENCOUNTER — Emergency Department (HOSPITAL_COMMUNITY)
Admission: EM | Admit: 2012-06-23 | Discharge: 2012-06-23 | Disposition: A | Discharge status: Home / Self Care | Payer: Medicare Other | Attending: Emergency Medicine | Admitting: Emergency Medicine

## 2012-06-23 ENCOUNTER — Other Ambulatory Visit: Payer: Self-pay | Admitting: *Deleted

## 2012-06-23 ENCOUNTER — Encounter (HOSPITAL_COMMUNITY): Payer: Self-pay

## 2012-06-23 DIAGNOSIS — R45851 Suicidal ideations: Secondary | ICD-10-CM | POA: Insufficient documentation

## 2012-06-23 DIAGNOSIS — F3289 Other specified depressive episodes: Secondary | ICD-10-CM | POA: Insufficient documentation

## 2012-06-23 DIAGNOSIS — Z79899 Other long term (current) drug therapy: Secondary | ICD-10-CM | POA: Insufficient documentation

## 2012-06-23 DIAGNOSIS — E875 Hyperkalemia: Secondary | ICD-10-CM

## 2012-06-23 DIAGNOSIS — Z8673 Personal history of transient ischemic attack (TIA), and cerebral infarction without residual deficits: Secondary | ICD-10-CM | POA: Insufficient documentation

## 2012-06-23 DIAGNOSIS — E785 Hyperlipidemia, unspecified: Secondary | ICD-10-CM | POA: Insufficient documentation

## 2012-06-23 DIAGNOSIS — R300 Dysuria: Secondary | ICD-10-CM | POA: Insufficient documentation

## 2012-06-23 DIAGNOSIS — F19939 Other psychoactive substance use, unspecified with withdrawal, unspecified: Secondary | ICD-10-CM | POA: Insufficient documentation

## 2012-06-23 DIAGNOSIS — M1990 Unspecified osteoarthritis, unspecified site: Secondary | ICD-10-CM | POA: Insufficient documentation

## 2012-06-23 DIAGNOSIS — F329 Major depressive disorder, single episode, unspecified: Secondary | ICD-10-CM | POA: Insufficient documentation

## 2012-06-23 DIAGNOSIS — I10 Essential (primary) hypertension: Secondary | ICD-10-CM | POA: Insufficient documentation

## 2012-06-23 DIAGNOSIS — R259 Unspecified abnormal involuntary movements: Secondary | ICD-10-CM | POA: Insufficient documentation

## 2012-06-23 DIAGNOSIS — F13239 Sedative, hypnotic or anxiolytic dependence with withdrawal, unspecified: Secondary | ICD-10-CM

## 2012-06-23 DIAGNOSIS — K219 Gastro-esophageal reflux disease without esophagitis: Secondary | ICD-10-CM | POA: Insufficient documentation

## 2012-06-23 DIAGNOSIS — J449 Chronic obstructive pulmonary disease, unspecified: Secondary | ICD-10-CM | POA: Insufficient documentation

## 2012-06-23 DIAGNOSIS — I252 Old myocardial infarction: Secondary | ICD-10-CM | POA: Insufficient documentation

## 2012-06-23 DIAGNOSIS — F411 Generalized anxiety disorder: Secondary | ICD-10-CM | POA: Insufficient documentation

## 2012-06-23 DIAGNOSIS — J4489 Other specified chronic obstructive pulmonary disease: Secondary | ICD-10-CM | POA: Insufficient documentation

## 2012-06-23 DIAGNOSIS — F1320 Sedative, hypnotic or anxiolytic dependence, uncomplicated: Secondary | ICD-10-CM | POA: Insufficient documentation

## 2012-06-23 HISTORY — DX: Acute myocardial infarction, unspecified: I21.9

## 2012-06-23 LAB — CBC
HCT: 40.8 % (ref 39.0–52.0)
Hemoglobin: 13.1 g/dL (ref 13.0–17.0)
MCH: 29.1 pg (ref 26.0–34.0)
MCHC: 32.1 g/dL (ref 30.0–36.0)
MCV: 90.7 fL (ref 78.0–100.0)
Platelets: 199 10*3/uL (ref 150–400)
RBC: 4.5 MIL/uL (ref 4.22–5.81)
RDW: 13.4 % (ref 11.5–15.5)
WBC: 6.8 10*3/uL (ref 4.0–10.5)

## 2012-06-23 LAB — URINALYSIS, ROUTINE W REFLEX MICROSCOPIC
Bilirubin Urine: NEGATIVE
Glucose, UA: NEGATIVE mg/dL
Hgb urine dipstick: NEGATIVE
Ketones, ur: NEGATIVE mg/dL
Leukocytes, UA: NEGATIVE
Nitrite: NEGATIVE
Protein, ur: NEGATIVE mg/dL
Specific Gravity, Urine: 1.017 (ref 1.005–1.030)
Urobilinogen, UA: 0.2 mg/dL (ref 0.0–1.0)
pH: 7 (ref 5.0–8.0)

## 2012-06-23 LAB — BASIC METABOLIC PANEL
BUN: 27 mg/dL — ABNORMAL HIGH (ref 6–23)
CO2: 26 mEq/L (ref 19–32)
Calcium: 9.2 mg/dL (ref 8.4–10.5)
Chloride: 103 mEq/L (ref 96–112)
Creatinine, Ser: 1.82 mg/dL — ABNORMAL HIGH (ref 0.50–1.35)
GFR calc Af Amer: 40 mL/min — ABNORMAL LOW (ref >90)
GFR calc non Af Amer: 34 mL/min — ABNORMAL LOW (ref >90)
Glucose, Bld: 85 mg/dL (ref 70–99)
Potassium: 5 mEq/L (ref 3.5–5.1)
Sodium: 137 mEq/L (ref 135–145)

## 2012-06-23 LAB — TROPONIN I: Troponin I: <0.3 ng/mL (ref <0.30)

## 2012-06-23 MED ORDER — LORAZEPAM 1 MG PO TABS: 0.5000 mg | ORAL_TABLET | Freq: Three times a day (TID) | ORAL | Status: DC | PRN

## 2012-06-23 MED ORDER — LORAZEPAM 2 MG/ML IJ SOLN
0.5000 mg | Freq: Once | INTRAMUSCULAR | Status: AC
  Administered 2012-06-23: 0.5 mg via INTRAVENOUS
  Filled 2012-06-23: qty 1

## 2012-06-23 MED ORDER — SODIUM CHLORIDE 0.9 % IV BOLUS (SEPSIS)
500.0000 mL | Freq: Once | INTRAVENOUS | Status: AC
  Administered 2012-06-23: 500 mL via INTRAVENOUS

## 2012-06-23 NOTE — ED Provider Notes (Signed)
History    78 year old male with shakiness and feeling like he is going to "climb over a wall.". Patient reports just feeling a nature. Onset over a month ago. Symptoms are persistent. Patient does admit to feeling depressed and suicidal ideation. No fevers or chills. Some dysuria. No nausea or vomiting. Patient was previously on lorazepam at relatively high doses for a period of several years. He states that this dizziness recently cut back. He says the symptoms started shortly after this.  CSN: [id number]  Arrival date & time 06/23/12  1407-03-07   First MD Initiated Contact with Patient 06/23/12 1510      Chief Complaint  Patient presents with  . Dysuria    and shakiness    (Consider location/radiation/quality/duration/timing/severity/associated sxs/prior treatment) HPI  Past Medical History  Diagnosis Date  . Back pain     DJD  . Hypertension   . Arteriosclerotic cardiovascular disease (ASCVD)     stent to RCA 03/06/96 and 07-Mar-2003  . COPD (chronic obstructive pulmonary disease)   . GERD (gastroesophageal reflux disease)   . Headache     chronic  . Chronic renal insufficiency     baseline Creatinine-1.4  . Anxiety and depression 1996    Chronic since wife's death in 07-Mar-1995  . Cognitive decline 03/06/2010    progressive since 06-Mar-2010, question of dementia  . Hyperlipidemia   . Stroke   . Cholelithiasis     Incidentally noted  . Heart attack     x2    Past Surgical History  Procedure Date  . Coronary stent placement 1997    RCA  . Coronary stent placement 2003/03/07    in-stent restenosis, stent replaced RCA.      Family History  Problem Relation Age of Onset  . Throat cancer Father     Smoker  . Heart disease Brother   . Asthma Son   . Heart disease Brother     History  Substance Use Topics  . Smoking status: Former Smoker -- 1.0 packs/day for 50 years    Types: Cigarettes    Quit date: 03/24/2012  . Smokeless tobacco: Not on file   Comment: Use electronic cigarette  . Alcohol  Use: Yes     very occasional      Review of Systems   Review of symptoms negative unless otherwise noted in HPI.   Allergies  Statins  Home Medications   Current Outpatient Rx  Name Route Sig Dispense Refill  . ACETAMINOPHEN 500 MG PO TABS Oral Take 1,000 mg by mouth every 6 (six) hours as needed. For pain.    . ALBUTEROL SULFATE HFA 108 (90 BASE) MCG/ACT IN AERS Inhalation Inhale 2 puffs into the lungs every 4 (four) hours as needed for wheezing or shortness of breath. For breathing 1 Inhaler 1  . AMLODIPINE BESYLATE 10 MG PO TABS Oral Take 10 mg by mouth daily.      Marland Kitchen CLOPIDOGREL BISULFATE 75 MG PO TABS Oral Take 75 mg by mouth daily.    . FEBUXOSTAT 80 MG PO TABS Oral Take 80 mg by mouth daily.     . IPRATROPIUM-ALBUTEROL 20-100 MCG/ACT IN AERS Inhalation Inhale 2 puffs into the lungs 3 (three) times daily. 1 Inhaler 11    WITH SPACER  . LORAZEPAM 0.5 MG PO TABS Oral Take 0.5 mg by mouth every 8 (eight) hours as needed. For anxiety.    Marland Kitchen MEMANTINE HCL 10 MG PO TABS Oral Take 10 mg by mouth 2 (two) times  daily.    Marland Kitchen PAROXETINE HCL 30 MG PO TABS Oral Take 30 mg by mouth every morning.    Marland Kitchen QUETIAPINE FUMARATE 25 MG PO TABS Oral Take 25 mg by mouth at bedtime.    Marland Kitchen RIVASTIGMINE 4.6 MG/24HR TD PT24 Transdermal Place 1 patch (4.6 mg total) onto the skin daily. 30 patch   . TAMSULOSIN HCL 0.4 MG PO CAPS Oral Take 1 capsule (0.4 mg total) by mouth daily. 30 capsule   . TRAMADOL HCL 50 MG PO TABS Oral Take 1 tablet (50 mg total) by mouth every 6 (six) hours as needed. For pain 30 tablet 0    BP 135/64  Pulse 69  Temp 98 F (36.7 C) (Oral)  Resp 20  Ht 5\' 7"  (1.702 m)  Wt 175 lb (79.379 kg)  BMI 27.41 kg/m2  SpO2 93%  Physical Exam  Nursing note and vitals reviewed. Constitutional: He is oriented to person, place, and time. He appears well-developed and well-nourished. No distress.  HENT:  Head: Normocephalic and atraumatic.  Eyes: Conjunctivae normal are normal. Right eye  exhibits no discharge. Left eye exhibits no discharge.  Neck: Neck supple.  Cardiovascular: Normal rate, regular rhythm and normal heart sounds.  Exam reveals no gallop and no friction rub.   No murmur heard. Pulmonary/Chest: Effort normal and breath sounds normal. No respiratory distress.  Abdominal: Soft. He exhibits no distension. There is no tenderness.  Musculoskeletal: He exhibits no edema and no tenderness.  Neurological: He is alert and oriented to person, place, and time. No cranial nerve deficit. He exhibits normal muscle tone. Coordination normal.       Mildly tremulous which resolves with intention  Skin: Skin is warm and dry. He is not diaphoretic.  Psychiatric: His behavior is normal. Thought content normal.       Mildly anxious    ED Course  Procedures (including critical care time)  Labs Reviewed  BASIC METABOLIC PANEL - Abnormal; Notable for the following:    BUN 27 (*)     Creatinine, Ser 1.82 (*)     GFR calc non Af Amer 34 (*)     GFR calc Af Amer 40 (*)     All other components within normal limits  URINALYSIS, ROUTINE W REFLEX MICROSCOPIC - Abnormal; Notable for the following:    APPearance CLOUDY (*)     All other components within normal limits  CBC  TROPONIN I   No results found.  EKG:  Rhythm: Sinus rhythm with PVC Vent. rate 70 BPM PR interval 136 ms QRS duration 92 ms QT/QTc 384/414 ms Intervals: Normal ST segments: Nonspecific ST changes   1. Benzodiazepine withdrawal       MDM  79yM with what essentially sounds like anxiety. I suspect pt is having symptoms of benzodiazepine withdrawal. Pt has been on ativan for over 10 years. Previously on 2mg  TID, but per son would take more than that and "popped them like candy." In the past couple months reduced to 0.5mg . Now being dispensed by son's wife as well.  Previous amount taking very high, particularly for his age and agree that should be on less.  I don't think it is unreasonable for pt to  have an additional 0.5mg  per day PRN with how symptomatic pt seems to be though.  Discussed with son that PCP needs to be aware of this though. Pt reports sleeping well with Seroquel. Denies SI or HI. No psychosis. Feel he is safe for DC. Admit in  July for RF and hyperkalemia. Cr elevated today but do not feel requires admit. IVF. Discharge. Close outpt fu.       Raeford Razor, MD 06/29/12 [phone]

## 2012-06-23 NOTE — ED Notes (Signed)
Pt c/o SOB on exertion, pt states that he hasn't felt good x1 week, he hasn't wanted to get out of bed, pt c/o gen weakness, states "I feel like I could just lay in the bed all day, I don't hardly want to even get up."

## 2012-06-23 NOTE — ED Notes (Signed)
Pt "hasn't felt good all week", this am started feeling shaky, having dysuria x1 month per pt

## 2012-07-03 LAB — BASIC METABOLIC PANEL
BUN: 28 mg/dL — ABNORMAL HIGH (ref 6–23)
CO2: 25 mEq/L (ref 19–32)
Chloride: 102 mEq/L (ref 96–112)
Creat: 1.96 mg/dL — ABNORMAL HIGH (ref 0.50–1.35)
Potassium: 5 mEq/L (ref 3.5–5.3)

## 2012-07-04 ENCOUNTER — Encounter: Payer: Self-pay | Admitting: Cardiology

## 2012-07-11 ENCOUNTER — Emergency Department (HOSPITAL_COMMUNITY)
Admission: EM | Admit: 2012-07-11 | Discharge: 2012-07-11 | Disposition: A | Discharge status: Home / Self Care | Payer: Medicare Other | Attending: Emergency Medicine | Admitting: Emergency Medicine

## 2012-07-11 ENCOUNTER — Emergency Department (HOSPITAL_COMMUNITY): Payer: Medicare Other

## 2012-07-11 ENCOUNTER — Encounter (HOSPITAL_COMMUNITY): Payer: Self-pay

## 2012-07-11 DIAGNOSIS — Z9861 Coronary angioplasty status: Secondary | ICD-10-CM | POA: Insufficient documentation

## 2012-07-11 DIAGNOSIS — I2510 Atherosclerotic heart disease of native coronary artery without angina pectoris: Secondary | ICD-10-CM | POA: Insufficient documentation

## 2012-07-11 DIAGNOSIS — I129 Hypertensive chronic kidney disease with stage 1 through stage 4 chronic kidney disease, or unspecified chronic kidney disease: Secondary | ICD-10-CM | POA: Insufficient documentation

## 2012-07-11 DIAGNOSIS — Z8673 Personal history of transient ischemic attack (TIA), and cerebral infarction without residual deficits: Secondary | ICD-10-CM | POA: Insufficient documentation

## 2012-07-11 DIAGNOSIS — Z8719 Personal history of other diseases of the digestive system: Secondary | ICD-10-CM | POA: Insufficient documentation

## 2012-07-11 DIAGNOSIS — J449 Chronic obstructive pulmonary disease, unspecified: Secondary | ICD-10-CM | POA: Insufficient documentation

## 2012-07-11 DIAGNOSIS — F329 Major depressive disorder, single episode, unspecified: Secondary | ICD-10-CM

## 2012-07-11 DIAGNOSIS — G3184 Mild cognitive impairment, so stated: Secondary | ICD-10-CM | POA: Insufficient documentation

## 2012-07-11 DIAGNOSIS — Z87891 Personal history of nicotine dependence: Secondary | ICD-10-CM | POA: Insufficient documentation

## 2012-07-11 DIAGNOSIS — R4189 Other symptoms and signs involving cognitive functions and awareness: Secondary | ICD-10-CM

## 2012-07-11 DIAGNOSIS — F419 Anxiety disorder, unspecified: Secondary | ICD-10-CM

## 2012-07-11 DIAGNOSIS — F341 Dysthymic disorder: Secondary | ICD-10-CM | POA: Insufficient documentation

## 2012-07-11 DIAGNOSIS — J4489 Other specified chronic obstructive pulmonary disease: Secondary | ICD-10-CM | POA: Insufficient documentation

## 2012-07-11 DIAGNOSIS — N189 Chronic kidney disease, unspecified: Secondary | ICD-10-CM | POA: Insufficient documentation

## 2012-07-11 DIAGNOSIS — E785 Hyperlipidemia, unspecified: Secondary | ICD-10-CM | POA: Insufficient documentation

## 2012-07-11 LAB — URINALYSIS, ROUTINE W REFLEX MICROSCOPIC
Leukocytes, UA: NEGATIVE
Nitrite: NEGATIVE
Specific Gravity, Urine: >1.03 — ABNORMAL HIGH (ref 1.005–1.030)
pH: 5.5 (ref 5.0–8.0)

## 2012-07-11 LAB — CBC WITH DIFFERENTIAL/PLATELET
Basophils Relative: 0 % (ref 0–1)
Eosinophils Absolute: 0.4 10*3/uL (ref 0.0–0.7)
Eosinophils Relative: 7 % — ABNORMAL HIGH (ref 0–5)
Lymphs Abs: 1.2 10*3/uL (ref 0.7–4.0)
MCH: 28.9 pg (ref 26.0–34.0)
MCHC: 31.9 g/dL (ref 30.0–36.0)
MCV: 90.7 fL (ref 78.0–100.0)
Monocytes Relative: 8 % (ref 3–12)
Platelets: 172 10*3/uL (ref 150–400)
RBC: 4.71 MIL/uL (ref 4.22–5.81)

## 2012-07-11 LAB — COMPREHENSIVE METABOLIC PANEL
Albumin: 3.7 g/dL (ref 3.5–5.2)
BUN: 31 mg/dL — ABNORMAL HIGH (ref 6–23)
Calcium: 9.8 mg/dL (ref 8.4–10.5)
GFR calc Af Amer: 35 mL/min — ABNORMAL LOW (ref >90)
Glucose, Bld: 99 mg/dL (ref 70–99)
Sodium: 140 mEq/L (ref 135–145)
Total Protein: 8.1 g/dL (ref 6.0–8.3)

## 2012-07-11 LAB — ETHANOL: Alcohol, Ethyl (B): <11 mg/dL (ref 0–11)

## 2012-07-11 LAB — RAPID URINE DRUG SCREEN, HOSP PERFORMED
Barbiturates: NOT DETECTED
Benzodiazepines: POSITIVE — AB

## 2012-07-11 MED ORDER — LORAZEPAM 1 MG PO TABS
1.0000 mg | ORAL_TABLET | Freq: Once | ORAL | Status: AC
  Administered 2012-07-11: 1 mg via ORAL
  Filled 2012-07-11: qty 1

## 2012-07-11 NOTE — ED Notes (Signed)
Pt reports has depression and anxiety.  Reports went to Dr. Christell Constant, his PCP, and was sent here for possible admission to [facility].  Pt presently denies SI or HI but says has had suicidal thoughts in the past.

## 2012-07-11 NOTE — ED Notes (Signed)
Family members with pt.  Pt in FT waiting area with family until room becomes available.

## 2012-07-11 NOTE — ED Notes (Signed)
Terry Flynn [phone] spoke with April.  Faxed paperwork needed for placement.  [phone]

## 2012-07-11 NOTE — ED Provider Notes (Signed)
History  This chart was scribed for Terry Jakes, MD by Ladona Ridgel Day. This patient was seen in room APA18/APA18 and the patient's care was started at 0848.   CSN: [id number]  Arrival date & time 07/11/12  2952   First MD Initiated Contact with Patient 07/11/12 [phone]      Chief Complaint  Patient presents with  . V70.1   Patient is a 78 y.o. male presenting with mental health disorder. The history is provided by the patient and a relative. No language interpreter was used.  Mental Health Problem Primary symptoms comment: depression/anxiety The current episode started more than 1 month ago. This is a chronic problem.  Precipitated by: dementia. The degree of incapacity that he is experiencing as a consequence of his illness is mild. Sequelae: does not feel like doing anything. Risk factors that are present for mental illness include neurological disease.   Terry Flynn is a 78 y.o. male who presents to the Emergency Department from PCP Dr. Christell Constant w/hx of dementia/depression/anxiety for possible admission to [facility]. Pt currently denies SI but had suicidal thoughts in the past. He presents with family who states spoke with [place] at [place] and communicated with [facility] to possibly have him admitted per increased depression/anxiety and sometimes suicidal ideations associated w/dx of dementia. He states medicines he's been taking Paxil, Lorazepam and Seroquel have not improved his symptoms and gradually over the past year he has not felt like himself. He has no psychiatrist or neurologist. He states a mild HA yesterday, baseline chronic neck pain. He denies CP, abdominal pain, SOB, leg swelling, nausea, emesis, visual disturbances, rash, cough/congestion/rhinorrhea. He had a head CT at [place] 2 months ago.   His PCP is Dr. Christell Constant  Past Medical History  Diagnosis Date  . Back pain     DJD  . Hypertension   . Arteriosclerotic  cardiovascular disease (ASCVD)     stent to RCA 02-19-96 and Feb 19, 2003  . COPD (chronic obstructive pulmonary disease)   . GERD (gastroesophageal reflux disease)   . Headache     chronic  . Chronic renal insufficiency     baseline Creatinine-1.4  . Anxiety and depression 1996    Chronic since wife's death in 1995-02-19  . Cognitive decline 2010-02-18    progressive since 18-Feb-2010, question of dementia  . Hyperlipidemia   . Stroke   . Cholelithiasis     Incidentally noted  . Heart attack     x2    Past Surgical History  Procedure Date  . Coronary stent placement 1997    RCA  . Coronary stent placement 02-19-03    in-stent restenosis, stent replaced RCA.      Family History  Problem Relation Age of Onset  . Throat cancer Father     Smoker  . Heart disease Brother   . Asthma Son   . Heart disease Brother     History  Substance Use Topics  . Smoking status: Former Smoker -- 1.0 packs/day for 50 years    Types: Cigarettes    Quit date: 03/24/2012  . Smokeless tobacco: Not on file  . Alcohol Use: No      Review of Systems  Constitutional: Negative for fever and chills.  Respiratory: Negative for shortness of breath.   Gastrointestinal: Negative for nausea and vomiting.  Neurological: Negative for weakness.  All other systems reviewed and are negative.    Allergies  Statins  Home Medications   Current  Outpatient Rx  Name Route Sig Dispense Refill  . ACETAMINOPHEN 500 MG PO TABS Oral Take 1,000 mg by mouth every 6 (six) hours as needed. For pain.    . ALBUTEROL SULFATE HFA 108 (90 BASE) MCG/ACT IN AERS Inhalation Inhale 2 puffs into the lungs every 4 (four) hours as needed for wheezing or shortness of breath. For breathing 1 Inhaler 1  . AMLODIPINE BESYLATE 10 MG PO TABS Oral Take 10 mg by mouth daily.      Marland Kitchen CLOPIDOGREL BISULFATE 75 MG PO TABS Oral Take 75 mg by mouth daily.    . FEBUXOSTAT 80 MG PO TABS Oral Take 80 mg by mouth daily.     . IPRATROPIUM-ALBUTEROL 20-100 MCG/ACT IN  AERS Inhalation Inhale 2 puffs into the lungs 3 (three) times daily. 1 Inhaler 11    WITH SPACER  . LORAZEPAM 1 MG PO TABS Oral Take 0.5 tablets (0.5 mg total) by mouth 3 (three) times daily as needed for anxiety. 30 tablet 0  . MEMANTINE HCL 10 MG PO TABS Oral Take 10 mg by mouth 2 (two) times daily.    Marland Kitchen PAROXETINE HCL 30 MG PO TABS Oral Take 30 mg by mouth every morning.    Marland Kitchen QUETIAPINE FUMARATE 25 MG PO TABS Oral Take 25 mg by mouth at bedtime.    . TAMSULOSIN HCL 0.4 MG PO CAPS Oral Take 1 capsule (0.4 mg total) by mouth daily. 30 capsule   . TRAMADOL HCL 50 MG PO TABS Oral Take 1 tablet (50 mg total) by mouth every 6 (six) hours as needed. For pain 30 tablet 0    Triage Vitals: BP 118/62  Pulse 76  Temp 98.4 F (36.9 C) (Oral)  Resp 18  Ht 5\' 8"  (1.727 m)  Wt 190 lb (86.183 kg)  BMI 28.89 kg/m2  SpO2 94%  Physical Exam  Nursing note and vitals reviewed. Constitutional: He is oriented to person, place, and time. He appears well-developed and well-nourished. No distress.  HENT:  Head: Normocephalic and atraumatic.  Mouth/Throat: Oropharynx is clear and moist.  Eyes: EOM are normal.  Neck: Neck supple. No tracheal deviation present.  Cardiovascular: Normal rate, regular rhythm and normal heart sounds.   No murmur heard. Pulmonary/Chest: Effort normal and breath sounds normal. No respiratory distress. He has no wheezes. He has no rales.  Abdominal: Soft. Bowel sounds are normal. He exhibits no distension. There is no tenderness. There is no rebound and no guarding.  Musculoskeletal: Normal range of motion. He exhibits no edema and no tenderness.  Neurological: He is alert and oriented to person, place, and time. No cranial nerve deficit. Coordination normal.       Cranial nerves 2-12 intact. Normal motor strength BUE/BLE.   Skin: Skin is warm and dry.  Psychiatric: He has a normal mood and affect. His behavior is normal.    ED Course  Procedures (including critical care  time) DIAGNOSTIC STUDIES: Oxygen Saturation is 94% on room air, adequate by my interpretation.    COORDINATION OF CARE: At 1020 AM On initial discussion with family and patient regarding overall process of admission to [facility] which at this time family states "We have already had him accepted and just need lab work done here" and family seems upset upon my information that our behavior health counselor will be working with them through the process and I will be responsible medical clearance. Family understands. Ordering blood work, ETOH, CXR, UD/UA.  Labs Reviewed  CBC WITH  DIFFERENTIAL - Abnormal; Notable for the following:    Eosinophils Relative 7 (*)     All other components within normal limits  COMPREHENSIVE METABOLIC PANEL - Abnormal; Notable for the following:    BUN 31 (*)     Creatinine, Ser 2.02 (*)     Total Bilirubin 0.2 (*)     GFR calc non Af Amer 30 (*)     GFR calc Af Amer 35 (*)     All other components within normal limits  URINALYSIS, ROUTINE W REFLEX MICROSCOPIC - Abnormal; Notable for the following:    Specific Gravity, Urine >1.030 (*)     All other components within normal limits  URINE RAPID DRUG SCREEN (HOSP PERFORMED) - Abnormal; Notable for the following:    Benzodiazepines POSITIVE (*)     All other components within normal limits  ETHANOL   Dg Chest 2 View  07/11/2012  *RADIOLOGY REPORT*  Clinical Data: Hypertension, coronary artery disease.  CHEST - 2 VIEW  Comparison: March 24, 2012.  Findings: Cardiomediastinal silhouette is stable.  No acute pulmonary disease is noted.  Bony thorax is intact.  IMPRESSION: No acute cardiopulmonary abnormality seen.   Original Report Authenticated By: Venita Sheffield., M.D.    Ct Head Wo Contrast  07/11/2012  *RADIOLOGY REPORT*  Clinical Data: Headaches.  CT HEAD WITHOUT CONTRAST  Technique:  Contiguous axial images were obtained from the base of the skull through the vertex without contrast.   Comparison: February 23, 2012.  Findings: Bony calvarium is intact.  Moderate diffuse cortical atrophy is noted.  No mass effect or midline shift is noted.  Left posterior parietal encephalomalacia is noted consistent with old infarction.  Ventricular size is within normal limits.  There is no evidence of mass lesion, hemorrhage or acute infarction.  IMPRESSION: Diffuse cortical atrophy.  Old left posterior parietal infarction. No acute intracranial abnormality seen.   Original Report Authenticated By: Venita Sheffield., M.D.    Results for orders placed during the hospital encounter of 07/11/12  CBC WITH DIFFERENTIAL      Component Value Range   WBC 6.5  4.0 - 10.5 K/uL   RBC 4.71  4.22 - 5.81 MIL/uL   Hemoglobin 13.6  13.0 - 17.0 g/dL   HCT 96.0  45.4 - 09.8 %   MCV 90.7  78.0 - 100.0 fL   MCH 28.9  26.0 - 34.0 pg   MCHC 31.9  30.0 - 36.0 g/dL   RDW 11.9  14.7 - 82.9 %   Platelets 172  150 - 400 K/uL   Neutrophils Relative 67  43 - 77 %   Neutro Abs 4.3  1.7 - 7.7 K/uL   Lymphocytes Relative 19  12 - 46 %   Lymphs Abs 1.2  0.7 - 4.0 K/uL   Monocytes Relative 8  3 - 12 %   Monocytes Absolute 0.5  0.1 - 1.0 K/uL   Eosinophils Relative 7 (*) 0 - 5 %   Eosinophils Absolute 0.4  0.0 - 0.7 K/uL   Basophils Relative 0  0 - 1 %   Basophils Absolute 0.0  0.0 - 0.1 K/uL  COMPREHENSIVE METABOLIC PANEL      Component Value Range   Sodium 140  135 - 145 mEq/L   Potassium 4.7  3.5 - 5.1 mEq/L   Chloride 102  96 - 112 mEq/L   CO2 27  19 - 32 mEq/L   Glucose, Bld 99  70 - 99  mg/dL   BUN 31 (*) 6 - 23 mg/dL   Creatinine, Ser 1.61 (*) 0.50 - 1.35 mg/dL   Calcium 9.8  8.4 - 09.6 mg/dL   Total Protein 8.1  6.0 - 8.3 g/dL   Albumin 3.7  3.5 - 5.2 g/dL   AST 11  0 - 37 U/L   ALT 9  0 - 53 U/L   Alkaline Phosphatase 96  39 - 117 U/L   Total Bilirubin 0.2 (*) 0.3 - 1.2 mg/dL   GFR calc non Af Amer 30 (*) >90 mL/min   GFR calc Af Amer 35 (*) >90 mL/min  URINALYSIS, ROUTINE W REFLEX MICROSCOPIC       Component Value Range   Color, Urine YELLOW  YELLOW   APPearance CLEAR  CLEAR   Specific Gravity, Urine >1.030 (*) 1.005 - 1.030   pH 5.5  5.0 - 8.0   Glucose, UA NEGATIVE  NEGATIVE mg/dL   Hgb urine dipstick NEGATIVE  NEGATIVE   Bilirubin Urine NEGATIVE  NEGATIVE   Ketones, ur NEGATIVE  NEGATIVE mg/dL   Protein, ur NEGATIVE  NEGATIVE mg/dL   Urobilinogen, UA 0.2  0.0 - 1.0 mg/dL   Nitrite NEGATIVE  NEGATIVE   Leukocytes, UA NEGATIVE  NEGATIVE  URINE RAPID DRUG SCREEN (HOSP PERFORMED)      Component Value Range   Opiates NONE DETECTED  NONE DETECTED   Cocaine NONE DETECTED  NONE DETECTED   Benzodiazepines POSITIVE (*) NONE DETECTED   Amphetamines NONE DETECTED  NONE DETECTED   Tetrahydrocannabinol NONE DETECTED  NONE DETECTED   Barbiturates NONE DETECTED  NONE DETECTED  ETHANOL      Component Value Range   Alcohol, Ethyl (B) <11  0 - 11 mg/dL      Date: 04/54/0981  Rate: 67  Rhythm: normal sinus rhythm  QRS Axis: normal  Intervals: normal  ST/T Wave abnormalities: normal  Conduction Disutrbances:none  Narrative Interpretation:   Old EKG Reviewed: unchanged From 06/23/12   1. Depression   2. Anxiety   3. Cognitive decline       MDM   Patient referred here by primary care Dr. For medical screening to go to [facility] behavioral health. Medical screening labs were done no significant abnormalities patient is medically cleared. Patient is not suicidal but does seem to have symptoms consistent with 30 significant depression some dementia and anxiety. Apparently [facility] is already aware that the patient will be coming to them which is waiting for medical screening. Behavioral health team a CT member time he will contact them before transverse complete.  Today's workup without any acute changes on the EKG no acute changes on the CT scan patient has had a stroke in the past. Urine drug screen is only positive for benzodiazepines which is supposed to be  on. Urinalysis is negative electrolytes without significant abnormalities some mild renal insufficiency with a creatinine of 2.0 tono leukocytosis no sniffing anemia.      I personally performed the services described in this documentation, which was scribed in my presence. The recorded information has been reviewed and considered.           Terry Jakes, MD 07/11/12 [phone]

## 2012-07-11 NOTE — ED Notes (Signed)
Pt request something for a headache and ativan. EDP aware

## 2012-08-22 ENCOUNTER — Institutional Professional Consult (permissible substitution): Payer: Medicare Other | Admitting: Internal Medicine

## 2012-08-27 ENCOUNTER — Emergency Department (HOSPITAL_COMMUNITY): Payer: Medicare Other

## 2012-08-27 ENCOUNTER — Other Ambulatory Visit (HOSPITAL_COMMUNITY): Payer: Medicare Other

## 2012-08-27 ENCOUNTER — Observation Stay (HOSPITAL_COMMUNITY): Payer: Medicare Other

## 2012-08-27 ENCOUNTER — Encounter (HOSPITAL_COMMUNITY): Payer: Self-pay | Admitting: Emergency Medicine

## 2012-08-27 ENCOUNTER — Inpatient Hospital Stay (HOSPITAL_COMMUNITY)
Admission: EM | Admit: 2012-08-27 | Discharge: 2012-08-29 | DRG: 683 | Disposition: A | Discharge status: Skilled Nursing Facility | Payer: Medicare Other | Attending: Family Medicine | Admitting: Family Medicine

## 2012-08-27 DIAGNOSIS — F17200 Nicotine dependence, unspecified, uncomplicated: Secondary | ICD-10-CM

## 2012-08-27 DIAGNOSIS — R339 Retention of urine, unspecified: Secondary | ICD-10-CM | POA: Diagnosis present

## 2012-08-27 DIAGNOSIS — E785 Hyperlipidemia, unspecified: Secondary | ICD-10-CM

## 2012-08-27 DIAGNOSIS — J4489 Other specified chronic obstructive pulmonary disease: Secondary | ICD-10-CM | POA: Diagnosis present

## 2012-08-27 DIAGNOSIS — R5383 Other fatigue: Secondary | ICD-10-CM

## 2012-08-27 DIAGNOSIS — I2510 Atherosclerotic heart disease of native coronary artery without angina pectoris: Secondary | ICD-10-CM

## 2012-08-27 DIAGNOSIS — Z8673 Personal history of transient ischemic attack (TIA), and cerebral infarction without residual deficits: Secondary | ICD-10-CM

## 2012-08-27 DIAGNOSIS — I129 Hypertensive chronic kidney disease with stage 1 through stage 4 chronic kidney disease, or unspecified chronic kidney disease: Secondary | ICD-10-CM | POA: Diagnosis present

## 2012-08-27 DIAGNOSIS — K219 Gastro-esophageal reflux disease without esophagitis: Secondary | ICD-10-CM

## 2012-08-27 DIAGNOSIS — N200 Calculus of kidney: Secondary | ICD-10-CM

## 2012-08-27 DIAGNOSIS — Z79899 Other long term (current) drug therapy: Secondary | ICD-10-CM

## 2012-08-27 DIAGNOSIS — F329 Major depressive disorder, single episode, unspecified: Secondary | ICD-10-CM | POA: Diagnosis present

## 2012-08-27 DIAGNOSIS — N179 Acute kidney failure, unspecified: Principal | ICD-10-CM | POA: Diagnosis present

## 2012-08-27 DIAGNOSIS — N189 Chronic kidney disease, unspecified: Secondary | ICD-10-CM | POA: Diagnosis present

## 2012-08-27 DIAGNOSIS — I472 Ventricular tachycardia, unspecified: Secondary | ICD-10-CM | POA: Diagnosis present

## 2012-08-27 DIAGNOSIS — F411 Generalized anxiety disorder: Secondary | ICD-10-CM | POA: Diagnosis present

## 2012-08-27 DIAGNOSIS — D649 Anemia, unspecified: Secondary | ICD-10-CM

## 2012-08-27 DIAGNOSIS — I4729 Other ventricular tachycardia: Secondary | ICD-10-CM | POA: Diagnosis present

## 2012-08-27 DIAGNOSIS — I252 Old myocardial infarction: Secondary | ICD-10-CM

## 2012-08-27 DIAGNOSIS — N289 Disorder of kidney and ureter, unspecified: Secondary | ICD-10-CM

## 2012-08-27 DIAGNOSIS — R531 Weakness: Secondary | ICD-10-CM

## 2012-08-27 DIAGNOSIS — I639 Cerebral infarction, unspecified: Secondary | ICD-10-CM

## 2012-08-27 DIAGNOSIS — F3289 Other specified depressive episodes: Secondary | ICD-10-CM | POA: Diagnosis present

## 2012-08-27 DIAGNOSIS — J449 Chronic obstructive pulmonary disease, unspecified: Secondary | ICD-10-CM

## 2012-08-27 DIAGNOSIS — F0390 Unspecified dementia without behavioral disturbance: Secondary | ICD-10-CM | POA: Diagnosis present

## 2012-08-27 DIAGNOSIS — R4189 Other symptoms and signs involving cognitive functions and awareness: Secondary | ICD-10-CM

## 2012-08-27 DIAGNOSIS — I10 Essential (primary) hypertension: Secondary | ICD-10-CM

## 2012-08-27 DIAGNOSIS — E860 Dehydration: Secondary | ICD-10-CM

## 2012-08-27 DIAGNOSIS — Z87891 Personal history of nicotine dependence: Secondary | ICD-10-CM

## 2012-08-27 LAB — URINALYSIS, ROUTINE W REFLEX MICROSCOPIC
Bilirubin Urine: NEGATIVE
Glucose, UA: NEGATIVE mg/dL
Hgb urine dipstick: NEGATIVE
Ketones, ur: NEGATIVE mg/dL
Protein, ur: NEGATIVE mg/dL
pH: 5.5 (ref 5.0–8.0)

## 2012-08-27 LAB — COMPREHENSIVE METABOLIC PANEL
ALT: 9 U/L (ref 0–53)
AST: 10 U/L (ref 0–37)
Albumin: 3.4 g/dL — ABNORMAL LOW (ref 3.5–5.2)
Alkaline Phosphatase: 106 U/L (ref 39–117)
BUN: 55 mg/dL — ABNORMAL HIGH (ref 6–23)
Chloride: 100 mEq/L (ref 96–112)
Potassium: 4 mEq/L (ref 3.5–5.1)
Sodium: 140 mEq/L (ref 135–145)
Total Bilirubin: 0.2 mg/dL — ABNORMAL LOW (ref 0.3–1.2)

## 2012-08-27 LAB — CBC WITH DIFFERENTIAL/PLATELET
Basophils Absolute: 0 10*3/uL (ref 0.0–0.1)
Basophils Relative: 0 % (ref 0–1)
Hemoglobin: 12.6 g/dL — ABNORMAL LOW (ref 13.0–17.0)
MCHC: 32.2 g/dL (ref 30.0–36.0)
Monocytes Relative: 8 % (ref 3–12)
Neutro Abs: 6.5 10*3/uL (ref 1.7–7.7)
Neutrophils Relative %: 73 % (ref 43–77)
Platelets: 201 10*3/uL (ref 150–400)

## 2012-08-27 MED ORDER — RIVASTIGMINE 4.6 MG/24HR TD PT24
4.6000 mg | MEDICATED_PATCH | Freq: Every day | TRANSDERMAL | Status: DC
  Administered 2012-08-28 – 2012-08-29 (×2): 4.6 mg via TRANSDERMAL
  Filled 2012-08-27 (×3): qty 1

## 2012-08-27 MED ORDER — ALBUTEROL SULFATE (5 MG/ML) 0.5% IN NEBU
5.0000 mg | INHALATION_SOLUTION | Freq: Once | RESPIRATORY_TRACT | Status: AC
  Administered 2012-08-27: 5 mg via RESPIRATORY_TRACT
  Filled 2012-08-27: qty 1

## 2012-08-27 MED ORDER — LORAZEPAM 0.5 MG PO TABS
0.5000 mg | ORAL_TABLET | Freq: Three times a day (TID) | ORAL | Status: DC | PRN
  Administered 2012-08-28 – 2012-08-29 (×6): 0.5 mg via ORAL
  Filled 2012-08-27 (×7): qty 1

## 2012-08-27 MED ORDER — AMLODIPINE BESYLATE 10 MG PO TABS
10.0000 mg | ORAL_TABLET | Freq: Every day | ORAL | Status: DC
  Administered 2012-08-27 – 2012-08-29 (×3): 10 mg via ORAL
  Filled 2012-08-27 (×3): qty 1

## 2012-08-27 MED ORDER — TAMSULOSIN HCL 0.4 MG PO CAPS
0.4000 mg | ORAL_CAPSULE | Freq: Every day | ORAL | Status: DC
  Administered 2012-08-27 – 2012-08-29 (×3): 0.4 mg via ORAL
  Filled 2012-08-27 (×3): qty 1

## 2012-08-27 MED ORDER — ALBUTEROL SULFATE (5 MG/ML) 0.5% IN NEBU: 5.0000 mg | INHALATION_SOLUTION | RESPIRATORY_TRACT | Status: DC | PRN

## 2012-08-27 MED ORDER — SODIUM CHLORIDE 0.9 % IV SOLN
INTRAVENOUS | Status: DC
  Administered 2012-08-28: 09:00:00 via INTRAVENOUS

## 2012-08-27 MED ORDER — SODIUM CHLORIDE 0.9 % IV SOLN: INTRAVENOUS | Status: DC

## 2012-08-27 MED ORDER — TRAMADOL HCL 50 MG PO TABS: 50.0000 mg | ORAL_TABLET | Freq: Four times a day (QID) | ORAL | Status: DC | PRN

## 2012-08-27 MED ORDER — PAROXETINE HCL 20 MG PO TABS
40.0000 mg | ORAL_TABLET | Freq: Every day | ORAL | Status: DC
  Administered 2012-08-28 – 2012-08-29 (×2): 40 mg via ORAL
  Filled 2012-08-27 (×2): qty 2

## 2012-08-27 MED ORDER — CLOPIDOGREL BISULFATE 75 MG PO TABS
75.0000 mg | ORAL_TABLET | Freq: Every day | ORAL | Status: DC
  Administered 2012-08-28 – 2012-08-29 (×2): 75 mg via ORAL
  Filled 2012-08-27 (×4): qty 1

## 2012-08-27 MED ORDER — ALBUTEROL SULFATE (5 MG/ML) 0.5% IN NEBU
2.5000 mg | INHALATION_SOLUTION | RESPIRATORY_TRACT | Status: DC | PRN
  Administered 2012-08-29 (×2): 2.5 mg via RESPIRATORY_TRACT
  Filled 2012-08-27 (×2): qty 0.5

## 2012-08-27 MED ORDER — MEMANTINE HCL ER 28 MG PO CP24: 28.0000 mg | ORAL_CAPSULE | Freq: Every evening | ORAL | Status: DC

## 2012-08-27 MED ORDER — TRAZODONE HCL 50 MG PO TABS
50.0000 mg | ORAL_TABLET | Freq: Every day | ORAL | Status: DC
  Administered 2012-08-27 – 2012-08-28 (×2): 50 mg via ORAL
  Filled 2012-08-27 (×3): qty 1

## 2012-08-27 MED ORDER — MEMANTINE HCL ER 28 MG PO CP24: 1.0000 | ORAL_CAPSULE | Freq: Every evening | ORAL | Status: DC

## 2012-08-27 MED ORDER — RISPERIDONE 0.5 MG PO TABS
0.5000 mg | ORAL_TABLET | Freq: Every day | ORAL | Status: DC
  Administered 2012-08-27 – 2012-08-29 (×3): 0.5 mg via ORAL
  Filled 2012-08-27 (×3): qty 1

## 2012-08-27 MED ORDER — FEBUXOSTAT 80 MG PO TABS
80.0000 mg | ORAL_TABLET | Freq: Every day | ORAL | Status: DC
  Administered 2012-08-27 – 2012-08-29 (×3): 80 mg via ORAL
  Filled 2012-08-27 (×3): qty 1

## 2012-08-27 MED ORDER — SODIUM CHLORIDE 0.9 % IV BOLUS (SEPSIS)
500.0000 mL | Freq: Once | INTRAVENOUS | Status: AC
  Administered 2012-08-27: 500 mL via INTRAVENOUS

## 2012-08-27 MED ORDER — IPRATROPIUM BROMIDE 0.02 % IN SOLN
0.5000 mg | Freq: Once | RESPIRATORY_TRACT | Status: AC
  Administered 2012-08-27: 0.5 mg via RESPIRATORY_TRACT
  Filled 2012-08-27: qty 2.5

## 2012-08-27 MED ORDER — IPRATROPIUM BROMIDE 0.02 % IN SOLN
0.5000 mg | RESPIRATORY_TRACT | Status: DC | PRN
  Administered 2012-08-29 (×2): 0.5 mg via RESPIRATORY_TRACT
  Filled 2012-08-27 (×2): qty 2.5

## 2012-08-27 NOTE — ED Notes (Signed)
Bladder scan performed with 132 mL in the bladder

## 2012-08-27 NOTE — ED Notes (Signed)
Pt unable to void at this time. Will attempt later.

## 2012-08-27 NOTE — ED Notes (Signed)
Per EMS - Pt w/ onset of difficulty walking last p.m., family contacted [facility] this a.m instructed to call EMS. When pt was unable to get out of bed by himself, 7 attempts to get out of bed this morning, needs moderate support, takes small steps. This is a new event for this pt., being last p.m. BP - 142/88, HR - 72 (NSR) , Resp - 16, O2 SAT 96. CBG - 209.

## 2012-08-27 NOTE — ED Notes (Signed)
Attempted in and out catheter but was unable to get past the prostate. RN notified.

## 2012-08-27 NOTE — ED Notes (Signed)
All Medications taken home by daughter in law Terry Flynn.

## 2012-08-27 NOTE — ED Notes (Signed)
Daughter in law Michel Santee [place]). [phone]) is her cell phone if staff needs her please call.

## 2012-08-27 NOTE — H&P (Signed)
PCP:   [name], MD   Chief Complaint:  Weakness  HPI: 78 year old male  with a history of dementia, chronic disease, COPD, CAD,  stroke who was brought to the hospital after patient developed generalized weakness for one day. As per patient's family both her son and daughter-in-law patient was started on Lasix 20 g by mouth twice a day by his family care physician 4 days ago for leg swelling. And now patient has become very V. tach he was difficult time to move around also patient was not able to void. In the ED patient bladder scan showed 132 ml.patient denies any chest pain no shortness of breath no nausea vomiting or diarrhea. He denies blurred vision denies passing out spell.   Allergies :   Allergies  Allergen Reactions  . Statins Other (See Comments)    REACTION: MYALGIA      Past Medical History  Diagnosis Date  . Back pain     DJD  . Hypertension   . Arteriosclerotic cardiovascular disease (ASCVD)     stent to RCA 02/17/1996 and 17-Feb-2003  . COPD (chronic obstructive pulmonary disease)   . GERD (gastroesophageal reflux disease)   . Headache     chronic  . Chronic renal insufficiency     baseline Creatinine-1.4  . Anxiety and depression 1996    Chronic since wife's death in 02-17-95  . Cognitive decline 16-Feb-2010    progressive since 2010-02-16, question of dementia  . Hyperlipidemia   . Cholelithiasis     Incidentally noted  . Heart attack     x2  . Stroke July 2013    Past Surgical History  Procedure Date  . Coronary stent placement 1997    RCA  . Coronary stent placement 02/17/03    in-stent restenosis, stent replaced RCA.      Prior to Admission medications   Medication Sig Start Date End Date Taking? Authorizing Provider  acetaminophen (TYLENOL) 500 MG tablet Take 1,000 mg by mouth every 6 (six) hours as needed. For pain.   Yes Historical Provider, MD  albuterol (PROAIR HFA) 108 (90 BASE) MCG/ACT inhaler Inhale 2 puffs into the lungs every 4 (four) hours as needed for  wheezing or shortness of breath. For breathing 02/26/12  Yes Clanford Cyndie Mull, MD  amLODipine (NORVASC) 10 MG tablet Take 10 mg by mouth daily.     Yes Historical Provider, MD  clopidogrel (PLAVIX) 75 MG tablet Take 75 mg by mouth daily.   Yes Historical Provider, MD  Febuxostat (ULORIC) 80 MG TABS Take 80 mg by mouth daily.    Yes Historical Provider, MD  furosemide (LASIX) 20 MG tablet Take 20 mg by mouth 2 (two) times daily.   Yes Historical Provider, MD  Ipratropium-Albuterol (COMBIVENT) 20-100 MCG/ACT AERS respimat Inhale 2 puffs into the lungs 3 (three) times daily. 05/16/12  Yes Kathlen Brunswick, MD  LORazepam (ATIVAN) 1 MG tablet Take 0.5 tablets (0.5 mg total) by mouth 3 (three) times daily as needed for anxiety. 06/23/12  Yes Raeford Razor, MD  Memantine HCl ER (NAMENDA XR) 28 MG CP24 Take 1 tablet by mouth every evening.   Yes Historical Provider, MD  PARoxetine (PAXIL) 40 MG tablet Take 40 mg by mouth every morning.   Yes Historical Provider, MD  risperiDONE (RISPERDAL) 0.25 MG tablet Take 0.25 mg by mouth daily.   Yes Historical Provider, MD  risperiDONE (RISPERDAL) 0.5 MG tablet Take 0.5 mg by mouth daily.   Yes Historical Provider, MD  rivastigmine (EXELON) 4.6 mg/24hr Place 1 patch onto the skin daily.   Yes Historical Provider, MD  Tamsulosin HCl (FLOMAX) 0.4 MG CAPS Take 1 capsule (0.4 mg total) by mouth daily. 03/28/12  Yes Christina P Rama, MD  traMADol (ULTRAM) 50 MG tablet Take 50 mg by mouth every 6 (six) hours as needed. For pain 03/28/12  Yes Maryruth Bun Rama, MD  traZODone (DESYREL) 50 MG tablet Take 50 mg by mouth at bedtime.   Yes Historical Provider, MD    Social History:  reports that he quit smoking about 5 months ago. His smoking use included Cigarettes. He has a 50 pack-year smoking history. He does not have any smokeless tobacco history on file. He reports that he does not drink alcohol or use illicit drugs.  Family History  Problem Relation Age of Onset  . Throat  cancer Father     Smoker  . Heart disease Brother   . Asthma Son   . Heart disease Brother     Review of Systems:  HEENT: Denies headache, blurred vision, runny nose, sore throat,  Neck: Denies thyroid problems,lymphadenopathy Chest : Denies shortness of breath, history of COPD Heart : Denies Chest pain,  positive history of  coronary arterey disease GI: Denies  nausea, vomiting, diarrhea, constipation GU: Denies dysuria, urgency, frequency of urination, hematuria Neuro:  positive history of  stroke,  denies seizures, syncope Psych: Denies depression, anxiety, hallucinations   Physical Exam: Blood pressure 130/70, pulse 71, temperature 97.7 F (36.5 C), temperature source Oral, resp. rate 14, SpO2 94.00%. Constitutional:   Patient is a well-developed and well-nourished  male  in no acute distress and cooperative with exam. Head: Normocephalic and atraumatic Mouth: Mucus membranes moist Eyes: PERRL, EOMI, conjunctivae normal Neck: Supple, No Thyromegaly Cardiovascular: RRR, S1 normal, S2 normal Pulmonary/Chest: CTAB, no wheezes, rales, or rhonchi Abdominal: Soft. Non-tender, non-distended, bowel sounds are normal, no masses, organomegaly, or guarding present.  Neurological: A&OX 2 Strenght is normal and symmetric bilaterally, cranial nerve II-XII are grossly intact, no focal motor deficit, sensory intact to light touch bilaterally.  Extremities : trace edema   Labs on Admission:  Results for orders placed during the hospital encounter of 08/27/12 (from the past 48 hour(s))  CBC WITH DIFFERENTIAL     Status: Abnormal   Collection Time   08/27/12 10:30 AM      Component Value Range Comment   WBC 8.9  4.0 - 10.5 K/uL    RBC 4.45  4.22 - 5.81 MIL/uL    Hemoglobin 12.6 (*) 13.0 - 17.0 g/dL    HCT 16.1  09.6 - 04.5 %    MCV 87.9  78.0 - 100.0 fL    MCH 28.3  26.0 - 34.0 pg    MCHC 32.2  30.0 - 36.0 g/dL    RDW 40.9  81.1 - 91.4 %    Platelets 201  150 - 400 K/uL    Neutrophils  Relative 73  43 - 77 %    Neutro Abs 6.5  1.7 - 7.7 K/uL    Lymphocytes Relative 16  12 - 46 %    Lymphs Abs 1.4  0.7 - 4.0 K/uL    Monocytes Relative 8  3 - 12 %    Monocytes Absolute 0.7  0.1 - 1.0 K/uL    Eosinophils Relative 3  0 - 5 %    Eosinophils Absolute 0.2  0.0 - 0.7 K/uL    Basophils Relative 0  0 - 1 %  Basophils Absolute 0.0  0.0 - 0.1 K/uL   COMPREHENSIVE METABOLIC PANEL     Status: Abnormal   Collection Time   08/27/12 10:30 AM      Component Value Range Comment   Sodium 140  135 - 145 mEq/L    Potassium 4.0  3.5 - 5.1 mEq/L    Chloride 100  96 - 112 mEq/L    CO2 27  19 - 32 mEq/L    Glucose, Bld 90  70 - 99 mg/dL    BUN 55 (*) 6 - 23 mg/dL    Creatinine, Ser 1.61 (*) 0.50 - 1.35 mg/dL    Calcium 8.9  8.4 - 09.6 mg/dL    Total Protein 7.9  6.0 - 8.3 g/dL    Albumin 3.4 (*) 3.5 - 5.2 g/dL    AST 10  0 - 37 U/L    ALT 9  0 - 53 U/L    Alkaline Phosphatase 106  39 - 117 U/L    Total Bilirubin 0.2 (*) 0.3 - 1.2 mg/dL    GFR calc non Af Amer 21 (*) >90 mL/min    GFR calc Af Amer 25 (*) >90 mL/min   TROPONIN I     Status: Normal   Collection Time   08/27/12 10:30 AM      Component Value Range Comment   Troponin I <0.30  <0.30 ng/mL   PRO B NATRIURETIC PEPTIDE     Status: Normal   Collection Time   08/27/12 10:30 AM      Component Value Range Comment   Pro B Natriuretic peptide (BNP) 108.8  0 - 450 pg/mL   URINALYSIS, ROUTINE W REFLEX MICROSCOPIC     Status: Normal   Collection Time   08/27/12  2:30 PM      Component Value Range Comment   Color, Urine YELLOW  YELLOW    APPearance CLEAR  CLEAR    Specific Gravity, Urine 1.020  1.005 - 1.030    pH 5.5  5.0 - 8.0    Glucose, UA NEGATIVE  NEGATIVE mg/dL    Hgb urine dipstick NEGATIVE  NEGATIVE    Bilirubin Urine NEGATIVE  NEGATIVE    Ketones, ur NEGATIVE  NEGATIVE mg/dL    Protein, ur NEGATIVE  NEGATIVE mg/dL    Urobilinogen, UA 0.2  0.0 - 1.0 mg/dL    Nitrite NEGATIVE  NEGATIVE    Leukocytes, UA NEGATIVE   NEGATIVE MICROSCOPIC NOT DONE ON URINES WITH NEGATIVE PROTEIN, BLOOD, LEUKOCYTES, NITRITE, OR GLUCOSE <1000 mg/dL.    Radiological Exams on Admission: Dg Chest 2 View  08/27/2012  *RADIOLOGY REPORT*  Clinical Data: Weakness, difficulty walking  CHEST - 2 VIEW  Comparison: 07/11/2012  Findings: Cardiomediastinal silhouette is stable.  No acute infiltrate or pleural effusion.  No pulmonary edema.  Bony thorax is stable.  IMPRESSION: No active disease.  No significant change.   Original Report Authenticated By: Natasha Mead, M.D.    Ct Head Wo Contrast  08/27/2012  *RADIOLOGY REPORT*  Clinical Data: 78 year old male with new difficulty walking and weakness.  History of CVA 6 months ago.  CT HEAD WITHOUT CONTRAST  Technique:  Contiguous axial images were obtained from the base of the skull through the vertex without contrast.  Comparison: 07/11/2012 and prior head CTs dating back to 02/14/2011  Findings: Atrophy and chronic small vessel white matter ischemic changes are again noted. A left posterior parietal infarct is again identified with expected evolutionary changes.  No acute intracranial  abnormalities are identified, including mass lesion or mass effect, hydrocephalus, extra-axial fluid collection, midline shift, hemorrhage, or acute infarction.  The visualized bony calvarium is unremarkable. Polypoid mucosal thickening/polyps within the right maxillary sinus and nasal cavity again noted.  IMPRESSION: No evidence of acute intracranial abnormality.  Atrophy, chronic small vessel white matter ischemic changes and remote left parietal infarct.  Polypoid mucosal thickening/polyps within the right maxillary sinus and nasal cavity.   Original Report Authenticated By: Harmon Pier, M.D.     Assessment/Plan  Dehydration  Acute on chronic kidney disease Urinary  retention  Dehydration Patient was started on Lasix recently and that has caused dehydration. At this time him and hold the Lasix and start him on  gentle IV fluids. Patient does not have a history of CHF he only had leg edema for which was prescribed Lasix.  Acute and chronic kidney disease Patient's maintenance elevated than his baseline am going to obtain a renal ultrasound to make sure that it is postobstructive uropathy contributing to the renal failure.  Urinary retention Patient has not been able to void in the ED I will order Foley catheter and we'll follow the renal ultrasound results.  COPD The patient was started on DuoNeb nebulizers every 4 hours when necessary for shortness of breath  DVT prophylaxis SCDs  Family communication: Discussed with patient's family at bedside including his son and daughter-in-law.  Time Spent on Admission:  70 minutes   LAMA,GAGAN S [facility] Pager: [phone] 08/27/2012, 4:15 PM

## 2012-08-27 NOTE — ED Notes (Signed)
Pt still unable to void at this time 

## 2012-08-27 NOTE — ED Notes (Signed)
Patient transported to CT 

## 2012-08-27 NOTE — ED Notes (Signed)
Pt states had stroke in July, started therapy on Friday, does not really remember why he is just starting therapy. Started  Having trouble walking prior to bed last p.m., dragging both feet. Has been started on Lasix, does have audible expiratory wheeze.

## 2012-08-27 NOTE — ED Notes (Signed)
[id number]<AV> Expected date:<BR> Expected time:<BR> Means of arrival:<BR> Comments:<BR> Ems/

## 2012-08-27 NOTE — ED Provider Notes (Deleted)
History     CSN: [id number]  Arrival date & time 08/27/12  1001   First MD Initiated Contact with Patient 08/27/12 1009      Chief Complaint  Patient presents with  . Difficulty Walking    (Consider location/radiation/quality/duration/timing/severity/associated sxs/prior treatment) HPI Comments: Patient with hx COPD, renal insufficiency, MI, stroke, dementia, chronic back and leg pain p/w generalized weakness and "difficulty walking" that began last night.  Daughter in law notes patient "didn't feel well" yesterday and slept most of the day, last night seemed to be having more back pain than usual and difficulty moving his bilateral legs.  States this chronic pain is generally exacerbated when he is sick.  Pt notes that he has had increasing SOB and swelling in his legs.  Pt states that this morning he tried to get out of bed and kept falling backwards, describing it as generalized weakness and feeling off balance.  Daughter notes malodorous urine this morning.  Denies dizziness, lightheadedness, CP, cough, abdominal pain, N/V/D, constipation, urinary problems.  Daughter-in-law denies any fevers or any other recent complaints.  Does note pt is being followed for blood in his stool and decreasing Hgb, also notes the wheezing has gotten worse gradually over weeks.  Daughter also notes his depression has seemed improved recently.    The history is provided by the patient, a relative and medical records.    Past Medical History  Diagnosis Date  . Back pain     DJD  . Hypertension   . Arteriosclerotic cardiovascular disease (ASCVD)     stent to RCA 1996-02-15 and 02-15-2003  . COPD (chronic obstructive pulmonary disease)   . GERD (gastroesophageal reflux disease)   . Headache     chronic  . Chronic renal insufficiency     baseline Creatinine-1.4  . Anxiety and depression 1996    Chronic since wife's death in 02-15-1995  . Cognitive decline 02/14/10    progressive since 14-Feb-2010, question of dementia  .  Hyperlipidemia   . Cholelithiasis     Incidentally noted  . Heart attack     x2  . Stroke July 2013    Past Surgical History  Procedure Date  . Coronary stent placement 1997    RCA  . Coronary stent placement Feb 15, 2003    in-stent restenosis, stent replaced RCA.      Family History  Problem Relation Age of Onset  . Throat cancer Father     Smoker  . Heart disease Brother   . Asthma Son   . Heart disease Brother     History  Substance Use Topics  . Smoking status: Former Smoker -- 1.0 packs/day for 50 years    Types: Cigarettes    Quit date: 03/24/2012  . Smokeless tobacco: Not on file  . Alcohol Use: No      Review of Systems  Constitutional: Negative for fever.  Respiratory: Positive for shortness of breath. Negative for cough.   Cardiovascular: Negative for chest pain.  Gastrointestinal: Positive for blood in stool. Negative for nausea, vomiting, abdominal pain, diarrhea and constipation.  Genitourinary: Negative for dysuria, urgency and frequency.  Musculoskeletal: Positive for back pain.  Neurological: Negative for dizziness, light-headedness and numbness.    Allergies  Statins  Home Medications   Current Outpatient Rx  Name  Route  Sig  Dispense  Refill  . ACETAMINOPHEN 500 MG PO TABS   Oral   Take 1,000 mg by mouth every 6 (six) hours as needed. For pain.         Marland Kitchen  ALBUTEROL SULFATE HFA 108 (90 BASE) MCG/ACT IN AERS   Inhalation   Inhale 2 puffs into the lungs every 4 (four) hours as needed for wheezing or shortness of breath. For breathing   1 Inhaler   1   . AMLODIPINE BESYLATE 10 MG PO TABS   Oral   Take 10 mg by mouth daily.           Marland Kitchen CLOPIDOGREL BISULFATE 75 MG PO TABS   Oral   Take 75 mg by mouth daily.         . FEBUXOSTAT 80 MG PO TABS   Oral   Take 80 mg by mouth daily.          . IPRATROPIUM-ALBUTEROL 20-100 MCG/ACT IN AERS   Inhalation   Inhale 2 puffs into the lungs 3 (three) times daily.   1 Inhaler   11     WITH  SPACER   . LORAZEPAM 1 MG PO TABS   Oral   Take 0.5 tablets (0.5 mg total) by mouth 3 (three) times daily as needed for anxiety.   30 tablet   0   . MEMANTINE HCL 10 MG PO TABS   Oral   Take 10 mg by mouth 2 (two) times daily.         Marland Kitchen PAROXETINE HCL 30 MG PO TABS   Oral   Take 30 mg by mouth every morning.         Marland Kitchen QUETIAPINE FUMARATE 25 MG PO TABS   Oral   Take 25 mg by mouth at bedtime.         . TAMSULOSIN HCL 0.4 MG PO CAPS   Oral   Take 1 capsule (0.4 mg total) by mouth daily.   30 capsule      . TRAMADOL HCL 50 MG PO TABS   Oral   Take 1 tablet (50 mg total) by mouth every 6 (six) hours as needed. For pain   30 tablet   0     BP 126/66  Pulse 71  Temp 97.7 F (36.5 C) (Oral)  Resp 20  SpO2 93%  Physical Exam  Nursing note and vitals reviewed. Constitutional: He appears well-developed and well-nourished. No distress.  HENT:  Head: Normocephalic and atraumatic.  Mouth/Throat: Mucous membranes are dry.  Neck: Neck supple.  Cardiovascular: Normal rate and regular rhythm.   Pulmonary/Chest: Effort normal. No respiratory distress. He has wheezes. He has no rales. He exhibits no tenderness.  Abdominal: Soft. He exhibits no distension and no mass. There is no tenderness. There is no rebound and no guarding.  Musculoskeletal: He exhibits edema.  Neurological: He is alert. He has normal strength. No cranial nerve deficit or sensory deficit. He exhibits normal muscle tone. GCS eye subscore is 4. GCS verbal subscore is 5. GCS motor subscore is 6.       CN II-XII intact, EOMs intact, no pronator drift, grip strengths equal bilaterally; strength 5/5 in all extremities, sensation intact in all extremities; finger to nose, heel to shin, rapid alternating movements normal.  Unable to assess gait or trunk ataxia as pt is too weak currently to hold himself up.    Skin: He is not diaphoretic.    ED Course  Procedures (including critical care time)  Labs Reviewed   CBC WITH DIFFERENTIAL - Abnormal; Notable for the following:    Hemoglobin 12.6 (*)     All other components within normal limits  COMPREHENSIVE METABOLIC PANEL - Abnormal; Notable  for the following:    BUN 55 (*)     Creatinine, Ser 2.67 (*)     Albumin 3.4 (*)     Total Bilirubin 0.2 (*)     GFR calc non Af Amer 21 (*)     GFR calc Af Amer 25 (*)     All other components within normal limits  TROPONIN I  PRO B NATRIURETIC PEPTIDE  URINALYSIS, ROUTINE W REFLEX MICROSCOPIC  URINE CULTURE   Dg Chest 2 View  08/27/2012  *RADIOLOGY REPORT*  Clinical Data: Weakness, difficulty walking  CHEST - 2 VIEW  Comparison: 07/11/2012  Findings: Cardiomediastinal silhouette is stable.  No acute infiltrate or pleural effusion.  No pulmonary edema.  Bony thorax is stable.  IMPRESSION: No active disease.  No significant change.   Original Report Authenticated By: Natasha Mead, M.D.    Ct Head Wo Contrast  08/27/2012  *RADIOLOGY REPORT*  Clinical Data: 78 year old male with new difficulty walking and weakness.  History of CVA 6 months ago.  CT HEAD WITHOUT CONTRAST  Technique:  Contiguous axial images were obtained from the base of the skull through the vertex without contrast.  Comparison: 07/11/2012 and prior head CTs dating back to 02/14/2011  Findings: Atrophy and chronic small vessel white matter ischemic changes are again noted. A left posterior parietal infarct is again identified with expected evolutionary changes.  No acute intracranial abnormalities are identified, including mass lesion or mass effect, hydrocephalus, extra-axial fluid collection, midline shift, hemorrhage, or acute infarction.  The visualized bony calvarium is unremarkable. Polypoid mucosal thickening/polyps within the right maxillary sinus and nasal cavity again noted.  IMPRESSION: No evidence of acute intracranial abnormality.  Atrophy, chronic small vessel white matter ischemic changes and remote left parietal infarct.  Polypoid mucosal  thickening/polyps within the right maxillary sinus and nasal cavity.   Original Report Authenticated By: Harmon Pier, M.D.      Date: 08/27/2012  Rate: 72  Rhythm: normal sinus rhythm  QRS Axis: normal  Intervals: normal  ST/T Wave abnormalities: normal  Conduction Disutrbances: none  Narrative Interpretation:  +artifact  Old EKG Reviewed: No significant changes noted  11:12 AM Discussed patient with Dr Jeraldine Loots.   11:28 AM Following breathing treatment, pt is much more alert and interactive.  States he is feeling much better.  Lungs CTAB.    3:43 PM Pt admitted to [facility] for overnight observation, hydration.    1. Generalized weakness   2. Dehydration   3. Renal insufficiency      MDM  10:41 AM Elderly patient with multiple medical problems presenting with generalized weakness that began yesterday.  Increased wheezing, treated with nebs.  O2 sat on room air in low 90s while awake.  No focal weakness, no neurological deficits (though unable to test gait initially).     Patient admitted to [facility].  Per daughter-in-law, they are unable to take care of him at home while he is this weak.  Pt is clinically dehydrated and has worsening renal function.  Plan is for obs admission for IV hydration, urine culture pending.  No obvious infection on admission.         [place], [place] 08/27/12 2002

## 2012-08-28 DIAGNOSIS — F09 Unspecified mental disorder due to known physiological condition: Secondary | ICD-10-CM

## 2012-08-28 DIAGNOSIS — D649 Anemia, unspecified: Secondary | ICD-10-CM

## 2012-08-28 LAB — COMPREHENSIVE METABOLIC PANEL
ALT: 8 U/L (ref 0–53)
AST: 10 U/L (ref 0–37)
Albumin: 2.9 g/dL — ABNORMAL LOW (ref 3.5–5.2)
Alkaline Phosphatase: 85 U/L (ref 39–117)
Potassium: 4.1 mEq/L (ref 3.5–5.1)
Sodium: 138 mEq/L (ref 135–145)
Total Protein: 6.6 g/dL (ref 6.0–8.3)

## 2012-08-28 LAB — CBC
HCT: 34.4 % — ABNORMAL LOW (ref 39.0–52.0)
MCHC: 32 g/dL (ref 30.0–36.0)
MCV: 89.1 fL (ref 78.0–100.0)
RDW: 14.8 % (ref 11.5–15.5)
WBC: 8.3 10*3/uL (ref 4.0–10.5)

## 2012-08-28 LAB — URINE CULTURE

## 2012-08-28 MED ORDER — NICOTINE 21 MG/24HR TD PT24
21.0000 mg | MEDICATED_PATCH | Freq: Every day | TRANSDERMAL | Status: DC
  Administered 2012-08-28 – 2012-08-29 (×2): 21 mg via TRANSDERMAL
  Filled 2012-08-28 (×2): qty 1

## 2012-08-28 MED ORDER — MEMANTINE HCL 10 MG PO TABS
10.0000 mg | ORAL_TABLET | Freq: Two times a day (BID) | ORAL | Status: DC
Start: 2012-08-28 — End: 2012-08-29
  Administered 2012-08-28 – 2012-08-29 (×2): 10 mg via ORAL
  Filled 2012-08-28 (×3): qty 1

## 2012-08-28 NOTE — Progress Notes (Signed)
Subjective: Patient seen, foley catheter removed today. Renal ultrasound is negative for hydronephrosis.Family wants him to go to rehab for weakness.   Objective: Vital signs in last 24 hours: Temp:  [97.6 F (36.4 C)-98.4 F (36.9 C)] 98.4 F (36.9 C) (12/09 1400) Pulse Rate:  [70-77] 74  (12/09 1400) Resp:  [14-20] 20  (12/09 1400) BP: (112-134)/(66-76) 134/73 mmHg (12/09 1400) SpO2:  [91 %-96 %] 96 % (12/09 1400) Weight:  [96.3 kg (212 lb 4.9 oz)] 96.3 kg (212 lb 4.9 oz) (12/08 1700) Weight change:  Last BM Date: 08/26/12  Consults: PT  Procedures: Renal ultrasound  Intake/Output from previous day: 12/08 0701 - 12/09 0700 In: 1260 [P.O.:360; I.V.:900] Out: 1375 [Urine:1375] Total I/O In: 480 [P.O.:480] Out: 400 [Urine:400]   Physical Exam: Head: Normocephalic, atraumatic.  Eyes: No signs of jaundice, EOMI Nose: Mucous membranes dry.  Neck: supple,No deformities, masses, or tenderness noted. Lungs: Normal respiratory effort. B/L Clear to auscultation, no crackles or wheezes.  Heart: Regular RR. S1 and S2 normal  Abdomen: BS normoactive. Soft, Nondistended, non-tender.  Extremities: No pretibial edema, no erythema   Lab Results: Basic Metabolic Panel:  Basename 08/28/12 0436 08/27/12 1030  NA 138 140  K 4.1 4.0  CL 102 100  CO2 25 27  GLUCOSE 129* 90  BUN 43* 55*  CREATININE 2.06* 2.67*  CALCIUM 8.5 8.9  MG -- --  PHOS -- --   Liver Function Tests:  Basename 08/28/12 0436 08/27/12 1030  AST 10 10  ALT 8 9  ALKPHOS 85 106  BILITOT 0.2* 0.2*  PROT 6.6 7.9  ALBUMIN 2.9* 3.4*   No results found for this basename: LIPASE:2,AMYLASE:2 in the last 72 hours No results found for this basename: AMMONIA:2 in the last 72 hours CBC:  Basename 08/28/12 0436 08/27/12 1030  WBC 8.3 8.9  NEUTROABS -- 6.5  HGB 11.0* 12.6*  HCT 34.4* 39.1  MCV 89.1 87.9  PLT 170 201   Cardiac Enzymes:  Basename 08/27/12 1030  CKTOTAL --  CKMB --  CKMBINDEX --   TROPONINI <0.30   BNP:  Basename 08/27/12 1030  PROBNP 108.8   Drugs of Abuse     Component Value Date/Time   LABOPIA NONE DETECTED 07/11/2012 1108   COCAINSCRNUR NONE DETECTED 07/11/2012 1108   LABBENZ POSITIVE* 07/11/2012 1108   AMPHETMU NONE DETECTED 07/11/2012 1108   THCU NONE DETECTED 07/11/2012 1108   LABBARB NONE DETECTED 07/11/2012 1108    Alcohol Level: No results found for this basename: ETH:2 in the last 72 hours Urinalysis:  Basename 08/27/12 1430  COLORURINE YELLOW  LABSPEC 1.020  PHURINE 5.5  GLUCOSEU NEGATIVE  HGBUR NEGATIVE  BILIRUBINUR NEGATIVE  KETONESUR NEGATIVE  PROTEINUR NEGATIVE  UROBILINOGEN 0.2  NITRITE NEGATIVE  LEUKOCYTESUR NEGATIVE    No results found for this or any previous visit (from the past 240 hour(s)).  Studies/Results: Dg Chest 2 View  08/27/2012  *RADIOLOGY REPORT*  Clinical Data: Weakness, difficulty walking  CHEST - 2 VIEW  Comparison: 07/11/2012  Findings: Cardiomediastinal silhouette is stable.  No acute infiltrate or pleural effusion.  No pulmonary edema.  Bony thorax is stable.  IMPRESSION: No active disease.  No significant change.   Original Report Authenticated By: Natasha Mead, M.D.    Ct Head Wo Contrast  08/27/2012  *RADIOLOGY REPORT*  Clinical Data: 78 year old male with new difficulty walking and weakness.  History of CVA 6 months ago.  CT HEAD WITHOUT CONTRAST  Technique:  Contiguous axial images were obtained from  the base of the skull through the vertex without contrast.  Comparison: 07/11/2012 and prior head CTs dating back to 02/14/2011  Findings: Atrophy and chronic small vessel white matter ischemic changes are again noted. A left posterior parietal infarct is again identified with expected evolutionary changes.  No acute intracranial abnormalities are identified, including mass lesion or mass effect, hydrocephalus, extra-axial fluid collection, midline shift, hemorrhage, or acute infarction.  The visualized bony  calvarium is unremarkable. Polypoid mucosal thickening/polyps within the right maxillary sinus and nasal cavity again noted.  IMPRESSION: No evidence of acute intracranial abnormality.  Atrophy, chronic small vessel white matter ischemic changes and remote left parietal infarct.  Polypoid mucosal thickening/polyps within the right maxillary sinus and nasal cavity.   Original Report Authenticated By: Harmon Pier, M.D.    US Renal Port  08/28/2012  *RADIOLOGY REPORT*  Clinical Data: Renal failure.  RENAL/URINARY TRACT ULTRASOUND COMPLETE  Comparison:  03/24/2012  Findings:  Right Kidney:  The right kidney measures 10.2 cm length.  Small parenchymal cyst in the upper pole measuring 2.6 cm maximal diameter.  No solid mass or hydronephrosis.  Mild diffuse parenchymal thinning.  Left Kidney:  Left kidney measures 12.7 cm length.  Multiple parenchymal simple appearing cysts measuring from about 2 grams up to about 6 cm diameter.  No solid mass or hydronephrosis.  Mild diffuse parenchymal thinning.  Bladder:  Bladder is decompressed with Foley catheter is not evaluated.  IMPRESSION: Bilateral renal parenchymal cysts.  Mild parenchymal atrophy.  No hydronephrosis.   Original Report Authenticated By: Burman Nieves, M.D.     Medications: Scheduled Meds:   . amLODipine  10 mg Oral Daily  . clopidogrel  75 mg Oral Q breakfast  . Febuxostat  80 mg Oral Daily  . Memantine HCl ER  28 mg Oral QPM  . PARoxetine  40 mg Oral Daily  . risperiDONE  0.5 mg Oral Daily  . rivastigmine  4.6 mg Transdermal Daily  . Tamsulosin HCl  0.4 mg Oral Daily  . traZODone  50 mg Oral QHS  . [DISCONTINUED] sodium chloride   Intravenous STAT  . [DISCONTINUED] Memantine HCl ER  1 tablet Oral QPM   Continuous Infusions:   . [DISCONTINUED] sodium chloride 75 mL/hr at 08/28/12 0830   PRN Meds:.albuterol, ipratropium, LORazepam, traMADol, [DISCONTINUED] albuterol    Active Problems:  * No active hospital problems. *    Assessment/Plan:  Weakness Dehydration Dementia Acute on CKD  Weakness Sec to dehydration Resolved.  Dehydration Resolved  Dementia Stable Continue Risperdal, exelon  Dispo: pending PT evaluation   LOS: 1 day   [facility] Pager: [phone] 08/28/2012, 2:50 PM

## 2012-08-28 NOTE — Evaluation (Signed)
Physical Therapy Evaluation Patient Details Name: Terry Flynn MRN: [id number] DOB: July 08, 1933 Today's Date: 08/28/2012 Time: 4098-1191 PT Time Calculation (min): 22 min  PT Assessment / Plan / Recommendation Clinical Impression  Pt presents with dehydration, weakness, COPD, and chronic renal insufficiency.  Noted chart states pt has dementia, was mostly [facility] during session.  Also noted some wheezing during session, RN notified.  Tolerated OOB and ambulation in hallway, however demos very shuffled and forward flexed gait pattern, requiring assist to steady and max constant cuing to correct.  Pt will benefit from skilled PT in acute venue to address deficits.  PT recommends SNF for follow up at D/C to maximize pts safety.     PT Assessment  Patient needs continued PT services    Follow Up Recommendations  SNF;Supervision/Assistance - 24 hour    Does the patient have the potential to tolerate intense rehabilitation      Barriers to Discharge None Pts daughter not able to provide amount of assist at this time    Equipment Recommendations  None recommended by PT    Recommendations for Other Services OT consult   Frequency Min 3X/week    Precautions / Restrictions Precautions Precautions: Fall Restrictions Weight Bearing Restrictions: No   Pertinent Vitals/Pain No pain      Mobility  Bed Mobility Bed Mobility: Supine to Sit Supine to Sit: 3: Mod assist;HOB elevated Details for Bed Mobility Assistance: Very little assist for LEs out of bed (more guiding assist) and some assist for trunk to get to EOB and scooting via pad under pt.  Cues for hand placement to self assist.  Transfers Transfers: Sit to Stand;Stand to Sit Sit to Stand: 3: Mod assist;From elevated surface;With upper extremity assist;From bed Stand to Sit: 4: Min assist;3: Mod assist;With upper extremity assist;With armrests;To chair/3-in-1 Details for Transfer Assistance: Assist to rise and stabalize with cues  for hand placement, safety and controlled descent when sitting.  Ambulation/Gait Ambulation/Gait Assistance: 3: Mod assist Ambulation Distance (Feet): 82 Feet Assistive device: Rolling walker Ambulation/Gait Assistance Details: Assist to steady thoughout with max cues for upright posture, maintaining position inside of RW, and increased stride length due to noted shuffled gait pattern. Pt able to follow commands initially, however would return to shuffled gait after several steps.  Gait Pattern: Step-through pattern;Decreased stride length;Shuffle;Trunk flexed Gait velocity: decreased Stairs: No Wheelchair Mobility Wheelchair Mobility: No    Shoulder Instructions     Exercises     PT Diagnosis: Difficulty walking;Generalized weakness  PT Problem List: Decreased strength;Decreased activity tolerance;Decreased balance;Decreased mobility;Decreased knowledge of use of DME;Decreased safety awareness;Decreased knowledge of precautions;Cardiopulmonary status limiting activity PT Treatment Interventions: DME instruction;Gait training;Functional mobility training;Therapeutic activities;Therapeutic exercise;Balance training;Patient/family education   PT Goals Acute Rehab PT Goals PT Goal Formulation: With patient/family Time For Goal Achievement: 09/11/12 Potential to Achieve Goals: Good Pt will go Supine/Side to Sit: with supervision PT Goal: Supine/Side to Sit - Progress: Goal set today Pt will go Sit to Supine/Side: with supervision PT Goal: Sit to Supine/Side - Progress: Goal set today Pt will go Sit to Stand: with supervision PT Goal: Sit to Stand - Progress: Goal set today Pt will go Stand to Sit: with supervision PT Goal: Stand to Sit - Progress: Goal set today Pt will Ambulate: 51 - 150 feet;with supervision;with least restrictive assistive device PT Goal: Ambulate - Progress: Goal set today  Visit Information  Last PT Received On: 08/28/12 Assistance Needed: +1    Subjective  Data  Subjective: We can  go for a walk Patient Stated Goal: to get stronger.    Prior Functioning  Home Living Lives With: Son;Daughter Available Help at Discharge: Family;Available 24 hours/day Type of Home: House Home Access: Stairs to enter [employer] of Steps: 3 Entrance Stairs-Rails: Right Home Layout: One level Bathroom Shower/Tub: Engineer, manufacturing systems: Standard Home Adaptive Equipment: Environmental consultant - rolling;Straight cane Prior Function Level of Independence: Needs assistance Able to Take Stairs?: No Driving: No Vocation: Retired Musician: No difficulties    Cognition  Overall Cognitive Status: History of cognitive impairments - at baseline Arousal/Alertness: Awake/alert Orientation Level: Appears intact for tasks assessed Behavior During Session: [facility] for tasks performed    Extremity/Trunk Assessment Right Lower Extremity Assessment RLE ROM/Strength/Tone: Deficits RLE ROM/Strength/Tone Deficits: Grossly WFL per tasks assessed Left Lower Extremity Assessment LLE ROM/Strength/Tone: Deficits LLE ROM/Strength/Tone Deficits: Grossly WFL per tasks assessed.  Trunk Assessment Trunk Assessment: Kyphotic   Balance    End of Session PT - End of Session Equipment Utilized During Treatment: Gait belt Activity Tolerance: Patient limited by fatigue Patient left: in chair;with call bell/phone within reach;with family/visitor present Nurse Communication: Mobility status  GP Functional Assessment Tool Used: Clinical judgement Functional Limitation: Mobility: Walking and moving around Mobility: Walking and Moving Around Current Status ([id number]): At least 40 percent but less than 60 percent impaired, limited or restricted Mobility: Walking and Moving Around Goal Status [phone]): At least 20 percent but less than 40 percent impaired, limited or restricted   Lessie Dings 08/28/2012, 5:33 PM

## 2012-08-28 NOTE — Progress Notes (Signed)
[facility]  Patient Status: Active (receiving services up to time of hospitalization)  [facility] is providing the following services: RN and PT  If patient discharges after hours, please call [phone].  Please resume HH orders at d/c if needed.  Thank You  Norberta Keens, RN, BSN [phone] 08/28/2012, 11:23 AM

## 2012-08-28 NOTE — Progress Notes (Signed)
Patient wheezes with exertion. O2 sats on RA 91%-94%

## 2012-08-28 NOTE — ED Provider Notes (Signed)
This is a shared encounter.  On my exam the patient had already received nebs (x1) and was "better" per his daughter in law.  He denied complaints, but with his dementia, he is an unreliable historian.  With his progressive Sx, and worsening renal function he was admitted for further e/m.  I saw the ECG, relevant labs and studies - I agree with the interpretation.   Gerhard Munch, MD 08/28/12 [phone]

## 2012-08-28 NOTE — Care Management Note (Signed)
    Page 1 of 1   08/28/2012     3:29:17 PM   CARE MANAGEMENT NOTE 08/28/2012  Patient:  Terry Flynn, Terry Flynn   Account Number:  [id number]  Date Initiated:  08/28/2012  Documentation initiated by:  Lorenda Ishihara  Subjective/Objective Assessment:   78 yo male admitted with weakness, dehydration, unable to ambulate. PTA lived at home with dtr and son-in-law.     Action/Plan:   SNF for rehab   Anticipated DC Date:  08/29/2012   Anticipated DC Plan:  SKILLED NURSING FACILITY  In-house referral  Clinical Social Worker      DC Planning Services  CM consult      Choice offered to / List presented to:             Status of service:  Completed, signed off Medicare Important Message given?   (If response is "NO", the following Medicare IM given date fields will be blank) Date Medicare IM given:   Date Additional Medicare IM given:    Discharge Disposition:  SKILLED NURSING FACILITY  Per UR Regulation:  Reviewed for med. necessity/level of care/duration of stay  If discussed at Long Length of Stay Meetings, dates discussed:    Comments:

## 2012-08-29 ENCOUNTER — Observation Stay (HOSPITAL_COMMUNITY): Payer: Medicare Other

## 2012-08-29 MED ORDER — FUROSEMIDE 20 MG PO TABS: 20.0000 mg | ORAL_TABLET | Freq: Every day | ORAL | Status: DC

## 2012-08-29 MED ORDER — RISPERIDONE 0.25 MG PO TABS: 0.2500 mg | ORAL_TABLET | Freq: Every day | ORAL | Status: DC

## 2012-08-29 MED ORDER — RISPERIDONE 0.5 MG PO TABS: 0.5000 mg | ORAL_TABLET | Freq: Every day | ORAL | Status: DC

## 2012-08-29 MED ORDER — TRAMADOL HCL 50 MG PO TABS: 50.0000 mg | ORAL_TABLET | Freq: Four times a day (QID) | ORAL | Status: DC | PRN

## 2012-08-29 MED ORDER — ACETAMINOPHEN 500 MG PO TABS: 500.0000 mg | ORAL_TABLET | Freq: Four times a day (QID) | ORAL | Status: DC | PRN

## 2012-08-29 MED ORDER — LORAZEPAM 1 MG PO TABS
0.5000 mg | ORAL_TABLET | Freq: Three times a day (TID) | ORAL | Status: DC | PRN
Start: 2012-08-29 — End: 2013-01-12

## 2012-08-29 NOTE — Discharge Summary (Signed)
Physician Discharge Summary  Cheng Dec Matzke WUJ:[id number] DOB: 1932-10-16 DOA: 08/27/2012  PCP: [name], MD  Admit date: 08/27/2012 Discharge date: 08/29/2012  Time spent: 50 minutes  Recommendations for Outpatient Follow-up:  1. Follow up BMP in one week  Discharge Diagnoses:   Dehydration Acute on chronic kidney disease Dementia  Discharge Condition: Stable  Diet recommendation: Low-salt diet  Filed Weights   08/27/12 1700  Weight: 96.3 kg (212 lb 4.9 oz)    History of present illness:  78 year old male with a history of dementia, chronic disease, COPD, CAD, stroke who was brought to the hospital after patient developed generalized weakness for one day. As per patient's family both her son and daughter-in-law patient was started on Lasix 20 g by mouth twice a day by his family care physician 4 days ago for leg swelling. And now patient has become very V. tach he was difficult time to move around also patient was not able to void. In the ED patient bladder scan showed 132 ml.patient denies any chest pain no shortness of breath no nausea vomiting or diarrhea. He denies blurred vision denies passing out spell.    Hospital Course:  Dehydration Patient was started on IV fluids at this time his kidney function is improved. Patient was taking Lasix at home and have cut down the Lasix dose to 20 mg once daily  Dementia Currently patient is at baseline We will  continue him on Risperdal  Acute on chronic disease Patient has improved with IV fluids and is currently at his baseline   Procedures: Renal ultrasound showed no hydronephrosis Consultations:  None  Discharge Exam: Filed Vitals:   08/29/12 0007 08/29/12 0540 08/29/12 0859 08/29/12 1445  BP:  126/75  111/71  Pulse:  65 73 76  Temp:  98 F (36.7 C)  97.9 F (36.6 C)  TempSrc:  Oral  Oral  Resp:  18 18 20   Height:      Weight:      SpO2: 90% 91% 92% 91%      Discharge Instructions  Discharge  Orders    Future Appointments: Provider: Department: Dept Phone: Center:   10/03/2012 11:00 AM [facility] [phone] None   10/03/2012 12:00 PM Nyoka Cowden, MD [facility] [phone] None     Future Orders Please Complete By Expires   Diet - low sodium heart healthy      Increase activity slowly          Medication List     As of 08/29/2012  4:18 PM    TAKE these medications         acetaminophen 500 MG tablet   Commonly known as: TYLENOL   Take 1 tablet (500 mg total) by mouth every 6 (six) hours as needed. For pain.      albuterol 108 (90 BASE) MCG/ACT inhaler   Commonly known as: PROVENTIL HFA;VENTOLIN HFA   Inhale 2 puffs into the lungs every 4 (four) hours as needed for wheezing or shortness of breath. For breathing      amLODipine 10 MG tablet   Commonly known as: NORVASC   Take 10 mg by mouth daily.      clopidogrel 75 MG tablet   Commonly known as: PLAVIX   Take 75 mg by mouth daily.      furosemide 20 MG tablet   Commonly known as: LASIX   Take 1 tablet (20 mg total) by mouth daily.  Ipratropium-Albuterol 20-100 MCG/ACT Aers respimat   Commonly known as: COMBIVENT   Inhale 2 puffs into the lungs 3 (three) times daily.      LORazepam 1 MG tablet   Commonly known as: ATIVAN   Take 0.5 tablets (0.5 mg total) by mouth 3 (three) times daily as needed for anxiety.      NAMENDA XR 28 MG Cp24   Generic drug: Memantine HCl ER   Take 1 tablet by mouth every evening.      PARoxetine 40 MG tablet   Commonly known as: PAXIL   Take 40 mg by mouth every morning.      risperiDONE 0.5 MG tablet   Commonly known as: RISPERDAL   Take 1 tablet (0.5 mg total) by mouth daily.      rivastigmine 4.6 mg/24hr   Commonly known as: EXELON   Place 1 patch onto the skin daily.      Tamsulosin HCl 0.4 MG Caps   Commonly known as: FLOMAX   Take 1 capsule (0.4 mg total) by mouth daily.      traMADol 50 MG tablet    Commonly known as: ULTRAM   Take 1 tablet (50 mg total) by mouth every 6 (six) hours as needed. For pain      traZODone 50 MG tablet   Commonly known as: DESYREL   Take 50 mg by mouth at bedtime.      ULORIC 80 MG Tabs   Generic drug: Febuxostat   Take 80 mg by mouth daily.      Risperidone 0.25 mg po daily    The results of significant diagnostics from this hospitalization (including imaging, microbiology, ancillary and laboratory) are listed below for reference.    Significant Diagnostic Studies: Dg Chest 2 View  08/29/2012  *RADIOLOGY REPORT*  Clinical Data: Dyspnea.  CHEST - 2 VIEW  Comparison: August 27, 2012.  Findings: Cardiomediastinal silhouette appears normal.  No acute pulmonary disease is noted.  Bony thorax is intact.  IMPRESSION: No acute cardiopulmonary abnormality seen.   Original Report Authenticated By: Lupita Raider.,  M.D.    Dg Chest 2 View  08/27/2012  *RADIOLOGY REPORT*  Clinical Data: Weakness, difficulty walking  CHEST - 2 VIEW  Comparison: 07/11/2012  Findings: Cardiomediastinal silhouette is stable.  No acute infiltrate or pleural effusion.  No pulmonary edema.  Bony thorax is stable.  IMPRESSION: No active disease.  No significant change.   Original Report Authenticated By: Natasha Mead, M.D.    Ct Head Wo Contrast  08/27/2012  *RADIOLOGY REPORT*  Clinical Data: 78 year old male with new difficulty walking and weakness.  History of CVA 6 months ago.  CT HEAD WITHOUT CONTRAST  Technique:  Contiguous axial images were obtained from the base of the skull through the vertex without contrast.  Comparison: 07/11/2012 and prior head CTs dating back to 02/14/2011  Findings: Atrophy and chronic small vessel white matter ischemic changes are again noted. A left posterior parietal infarct is again identified with expected evolutionary changes.  No acute intracranial abnormalities are identified, including mass lesion or mass effect, hydrocephalus, extra-axial fluid  collection, midline shift, hemorrhage, or acute infarction.  The visualized bony calvarium is unremarkable. Polypoid mucosal thickening/polyps within the right maxillary sinus and nasal cavity again noted.  IMPRESSION: No evidence of acute intracranial abnormality.  Atrophy, chronic small vessel white matter ischemic changes and remote left parietal infarct.  Polypoid mucosal thickening/polyps within the right maxillary sinus and nasal cavity.   Original Report Authenticated By:  Harmon Pier, M.D.    US Renal Port  08/28/2012  *RADIOLOGY REPORT*  Clinical Data: Renal failure.  RENAL/URINARY TRACT ULTRASOUND COMPLETE  Comparison:  03/24/2012  Findings:  Right Kidney:  The right kidney measures 10.2 cm length.  Small parenchymal cyst in the upper pole measuring 2.6 cm maximal diameter.  No solid mass or hydronephrosis.  Mild diffuse parenchymal thinning.  Left Kidney:  Left kidney measures 12.7 cm length.  Multiple parenchymal simple appearing cysts measuring from about 2 grams up to about 6 cm diameter.  No solid mass or hydronephrosis.  Mild diffuse parenchymal thinning.  Bladder:  Bladder is decompressed with Foley catheter is not evaluated.  IMPRESSION: Bilateral renal parenchymal cysts.  Mild parenchymal atrophy.  No hydronephrosis.   Original Report Authenticated By: Burman Nieves, M.D.     Microbiology: Recent Results (from the past 240 hour(s))  URINE CULTURE     Status: Normal   Collection Time   08/27/12  2:30 PM      Component Value Range Status Comment   Specimen Description URINE, CATHETERIZED   Final    Special Requests NONE   Final    Culture  Setup Time 08/27/2012 21:15   Final    Colony Count NO GROWTH   Final    Culture NO GROWTH   Final    Report Status 08/28/2012 FINAL   Final      Labs: Basic Metabolic Panel:  Lab 08/28/12 1610 08/27/12 1030  NA 138 140  K 4.1 4.0  CL 102 100  CO2 25 27  GLUCOSE 129* 90  BUN 43* 55*  CREATININE 2.06* 2.67*  CALCIUM 8.5 8.9  MG -- --   PHOS -- --   Liver Function Tests:  Lab 08/28/12 0436 08/27/12 1030  AST 10 10  ALT 8 9  ALKPHOS 85 106  BILITOT 0.2* 0.2*  PROT 6.6 7.9  ALBUMIN 2.9* 3.4*   No results found for this basename: LIPASE:5,AMYLASE:5 in the last 168 hours No results found for this basename: AMMONIA:5 in the last 168 hours CBC:  Lab 08/28/12 0436 08/27/12 1030  WBC 8.3 8.9  NEUTROABS -- 6.5  HGB 11.0* 12.6*  HCT 34.4* 39.1  MCV 89.1 87.9  PLT 170 201   Cardiac Enzymes:  Lab 08/27/12 1030  CKTOTAL --  CKMB --  CKMBINDEX --  TROPONINI <0.30   BNP: BNP (last 3 results)  Basename 08/27/12 1030 03/26/12 0530  PROBNP 108.8 375.0   CBG: No results found for this basename: GLUCAP:5 in the last 168 hours     Signed:  Valgene Deloatch S  [facility] 08/29/2012, 4:18 PM

## 2012-08-29 NOTE — Progress Notes (Signed)
Subjective: Patient seen and examined, evaluated by PT. They recommend to go to rehab. Filed Vitals:   08/29/12 1445  BP: 111/71  Pulse: 76  Temp: 97.9 F (36.6 C)  Resp: 20    Chest: Clear Bilaterally Heart : S1S2 RRR Abdomen: Soft, nontender Ext : No edema Neuro: Alert, oriented x 3  A/P  Dehydration Dementia Acute on CKD  Improved with iv hydration. Plan to go to rehab today.   Meredeth Ide [facility] [phone]

## 2012-08-29 NOTE — Progress Notes (Signed)
Per MD, Pt ready for d/c.  Notified RN, Pt, family and facility.  Sent d/c summary.  Confirmed receipt of d/c summary.  Facility ready to receive Pt.  Arranged for transportation.  Amanda Cordelia Bessinger, LCSWA Clinical Social Work [phone]   

## 2012-08-31 ENCOUNTER — Encounter (INDEPENDENT_AMBULATORY_CARE_PROVIDER_SITE_OTHER): Payer: Self-pay | Admitting: *Deleted

## 2012-08-31 NOTE — Progress Notes (Signed)
Clinical Social Work Department BRIEF PSYCHOSOCIAL ASSESSMENT 08/31/2012  Patient:  Terry Flynn, Terry Flynn     Account Number:  [id number]     Admit date:  08/27/2012  Clinical Social Worker:  Doroteo Glassman  Date/Time:  08/29/2012 12:00 M  Referred by:  Physician  Date Referred:  08/29/2012  Other Referral:   Interview type:  Other - See comment Other interview type:   son    PSYCHOSOCIAL DATA Living Status:  FAMILY Admitted from facility:   Level of care:   Primary support name:  Terry Flynn Primary support relationship to patient:  CHILD, ADULT Degree of support available:   adequate    CURRENT CONCERNS Current Concerns  Post-Acute Placement   Other Concerns:    SOCIAL WORK ASSESSMENT / PLAN Per RN, Pt unable to participate in assessment.    Contacted Pt's son via phone.    Pt's son stated that they would like for Pt to go to SNF and that Pt enjoys [facility].    Pt's son gave CSW permission to send Pt's information to all of Terry Flynn, with the hopes of Terry Flynn being able to accommodate Pt today.    CSW thanked Pt's son for his time.   Assessment/plan status:  Psychosocial Support/Ongoing Assessment of Needs Other assessment/ plan:   Information/referral to community resources:   n/a-    PATIENT'S/FAMILY'S RESPONSE TO PLAN OF CARE: Pt's son thanked CSW for time and assistance.   CSW to continue to follow.  Terry Flynn, LCSWA Clinical Social Work [phone]

## 2012-08-31 NOTE — Progress Notes (Signed)
Clinical Social Work Department CLINICAL SOCIAL WORK PLACEMENT NOTE 08/31/2012  Patient:  Terry Flynn, Terry Flynn  Account Number:  [id number] Admit date:  08/27/2012  Clinical Social Worker:  Doroteo Glassman  Date/time:  08/29/2012 12:00 M  Clinical Social Work is seeking post-discharge placement for this patient at the following level of care:   SKILLED NURSING   (*CSW will update this form in Epic as items are completed)    N/A-family only wanting [employer] provided with [employer] [facility] Department of Clinical Social Work's list of facilities offering this level of care within the geographic area requested by the patient (or if unable, by the patient's family).   08/29/12 Patient/family informed of their freedom to choose among providers that offer the needed level of care, that participate in Medicare, Medicaid or managed care program needed by the patient, have an available bed and are willing to accept the patient.   N/A Patient/family informed of [facility]' ownership interest in [facility], as well as of the fact that they are under no obligation to receive care at this facility.  PASARR submitted to EDS on existing PASARR number received from EDS on   FL2 transmitted to all facilities in geographic area requested by pt/family on  08/29/12 FL2 transmitted to all facilities within larger geographic area on   Patient informed that his/her managed care company has contracts with or will negotiate with  certain facilities, including the following:     Patient/family informed of bed offers received:  08/29/2012 Patient chooses bed at [facility] Physician recommends and patient chooses bed at    Patient to be transferred to [facility] on  08/29/2012 Patient to be transferred to facility by [facility]  The following physician request were entered in Epic:   Additional Comments:  CSW to continue to follow.  [name], LCSWA Clinical Social Work [phone]

## 2012-09-26 ENCOUNTER — Ambulatory Visit (INDEPENDENT_AMBULATORY_CARE_PROVIDER_SITE_OTHER): Payer: Medicare Other | Admitting: Internal Medicine

## 2012-09-26 ENCOUNTER — Encounter (INDEPENDENT_AMBULATORY_CARE_PROVIDER_SITE_OTHER): Payer: Self-pay | Admitting: Internal Medicine

## 2012-09-26 ENCOUNTER — Telehealth (INDEPENDENT_AMBULATORY_CARE_PROVIDER_SITE_OTHER): Payer: Self-pay | Admitting: *Deleted

## 2012-09-26 ENCOUNTER — Other Ambulatory Visit (INDEPENDENT_AMBULATORY_CARE_PROVIDER_SITE_OTHER): Payer: Self-pay | Admitting: *Deleted

## 2012-09-26 ENCOUNTER — Encounter (HOSPITAL_COMMUNITY): Payer: Self-pay | Admitting: Pharmacy Technician

## 2012-09-26 VITALS — BP 106/74 | HR 60 | Temp 97.8°F | Ht 66.0 in | Wt 211.9 lb

## 2012-09-26 DIAGNOSIS — K625 Hemorrhage of anus and rectum: Secondary | ICD-10-CM

## 2012-09-26 DIAGNOSIS — Z1211 Encounter for screening for malignant neoplasm of colon: Secondary | ICD-10-CM

## 2012-09-26 MED ORDER — PEG-KCL-NACL-NASULF-NA ASC-C 100 G PO SOLR: 1.0000 | Freq: Once | ORAL | Status: DC

## 2012-09-26 NOTE — Patient Instructions (Signed)
Colonoscopy.  The risks and benefits such as perforation, bleeding, and infection were reviewed with the patient and is agreeable. 

## 2012-09-26 NOTE — Telephone Encounter (Signed)
Patient needs movi prep 

## 2012-09-26 NOTE — Progress Notes (Signed)
Subjective:     Patient ID: Terry Grimes Sr., male   DOB: 11/10/1932, 79 y.o.   MRN: [id number]  HPIReferred to our office by Dr. Gari Crown for 2 heme positive stool cards in November. Patient denies seeing any blood in his stools.  Daughter in law tells me she has seen blood in his stool x 1 in the past. Presently a resident of [place] for physical therapy for ambulation. Resident x 1 month. Appetite is good. No weight loss.  Usually ha a BM once a day. No abdominal pain. No NSAIDS. He has had a slight drop in his hemoglobin.  He tells me he thinks he had a colonoscopy at Dr. Kathi Der office.  08/23/2012 H and H 12.3 and 40.3, MCV 97.2.  CBC    Component Value Date/Time   WBC 8.3 08/28/2012 0436   RBC 3.86* 08/28/2012 0436   HGB 11.0* 08/28/2012 0436   HCT 34.4* 08/28/2012 0436   PLT 170 08/28/2012 0436   MCV 89.1 08/28/2012 0436   MCH 28.5 08/28/2012 0436   MCHC 32.0 08/28/2012 0436   RDW 14.8 08/28/2012 0436   LYMPHSABS 1.4 08/27/2012 1030   MONOABS 0.7 08/27/2012 1030   EOSABS 0.2 08/27/2012 1030   BASOSABS 0.0 08/27/2012 1030   Current Outpatient Prescriptions  Medication Sig Dispense Refill  . acetaminophen (TYLENOL) 500 MG tablet Take 1 tablet (500 mg total) by mouth every 6 (six) hours as needed. For pain.  30 tablet  0  . albuterol (PROAIR HFA) 108 (90 BASE) MCG/ACT inhaler Inhale 2 puffs into the lungs every 4 (four) hours as needed for wheezing or shortness of breath. For breathing  1 Inhaler  1  . amLODipine (NORVASC) 10 MG tablet Take 10 mg by mouth daily.        . clopidogrel (PLAVIX) 75 MG tablet Take 75 mg by mouth daily.      . Febuxostat (ULORIC) 80 MG TABS Take 80 mg by mouth daily.       . Ipratropium-Albuterol (COMBIVENT) 20-100 MCG/ACT AERS respimat Inhale 2 puffs into the lungs 3 (three) times daily.      Marland Kitchen LORazepam (ATIVAN) 1 MG tablet Take 0.5 tablets (0.5 mg total) by mouth 3 (three) times daily as needed for anxiety.  30 tablet  0  . Memantine HCl ER  (NAMENDA XR) 28 MG CP24 Take 1 tablet by mouth every evening.      Marland Kitchen PARoxetine (PAXIL) 40 MG tablet Take 40 mg by mouth every morning.      . risperiDONE (RISPERDAL) 0.25 MG tablet Take 1 tablet (0.25 mg total) by mouth daily.  30 tablet  0  . risperiDONE (RISPERDAL) 0.5 MG tablet Take 1 tablet (0.5 mg total) by mouth daily.  30 tablet  0  . rivastigmine (EXELON) 4.6 mg/24hr Place 1 patch onto the skin daily.      . Tamsulosin HCl (FLOMAX) 0.4 MG CAPS Take 1 capsule (0.4 mg total) by mouth daily.  30 capsule    . traMADol (ULTRAM) 50 MG tablet Take 1 tablet (50 mg total) by mouth every 6 (six) hours as needed. For pain  30 tablet  0  . traZODone (DESYREL) 50 MG tablet Take 50 mg by mouth at bedtime.       Past Medical History  Diagnosis Date  . Back pain     DJD  . Hypertension   . Arteriosclerotic cardiovascular disease (ASCVD)     stent to RCA 1997 and 2004  .  COPD (chronic obstructive pulmonary disease)   . GERD (gastroesophageal reflux disease)   . Headache     chronic  . Chronic renal insufficiency     baseline Creatinine-1.4  . Anxiety and depression 1996    Chronic since wife's death in 02/27/1995  . Cognitive decline Feb 26, 2010    progressive since 26-Feb-2010, question of dementia  . Hyperlipidemia   . Cholelithiasis     Incidentally noted  . Heart attack     x2  . Stroke July 2013   Past Surgical History  Procedure Date  . Coronary stent placement 1997    RCA  . Coronary stent placement February 27, 2003    in-stent restenosis, stent replaced RCA.     Allergies  Allergen Reactions  . Statins Other (See Comments)    REACTION: MYALGIA       Review of Systems      Objective:   Physical Exam  Filed Vitals:   09/26/12 1116  BP: 106/74  Pulse: 60  Temp: 97.8 F (36.6 C)  Height: 5\' 6"  (1.676 m)  Weight: 211 lb 14.4 oz (96.117 kg)  Patient examined from wheel chair. Alert and oriented. Skin warm and dry. Oral mucosa is moist.   . Sclera anicteric, conjunctivae is pink. Thyroid not  enlarged. No cervical lymphadenopathy. Lungs clear. Heart regular rate and rhythm.  Abdomen is soft and obese.  Bowel sounds are positive. No hepatomegaly. No abdominal masses felt. No tenderness. 1+  edema to lower extremities.       Assessment:   Heme positive stool x 2. Colonic neoplasm needs to be ruled out. Diverticular bleed, AVM, polyp are also in the differential.    Plan:   Colonoscopy.The risks and benefits such as perforation, bleeding, and infection were reviewed with the patient and is agreeable.

## 2012-10-03 ENCOUNTER — Ambulatory Visit (INDEPENDENT_AMBULATORY_CARE_PROVIDER_SITE_OTHER): Payer: Medicare Other | Admitting: Internal Medicine

## 2012-10-03 ENCOUNTER — Encounter: Payer: Self-pay | Admitting: Internal Medicine

## 2012-10-03 VITALS — BP 120/70 | HR 85 | Temp 98.2°F | Ht 67.0 in | Wt 210.0 lb

## 2012-10-03 DIAGNOSIS — J449 Chronic obstructive pulmonary disease, unspecified: Secondary | ICD-10-CM

## 2012-10-03 LAB — PULMONARY FUNCTION TEST

## 2012-10-03 NOTE — Patient Instructions (Addendum)
Ok to use combivent if you can't catch your breath by resting or pacing yourself or if you find it helps you get around better when you go exert   You only have mild copd so pulmonary f/u  is not needed  unless symptoms worsen which is unlikely unless you gain weight or resume smoking or have some other problem besides copd.

## 2012-10-03 NOTE — Progress Notes (Signed)
PFT done today. 

## 2012-10-03 NOTE — Progress Notes (Deleted)
d 

## 2012-10-03 NOTE — Assessment & Plan Note (Addendum)
- -   PFT's 02/22/05 FEV1 2.04 (76%) ratio 64 21% better after B2, DLC0 78%  - PFT's 03/03/09 1.57(62%) ratio 58 11% better after B2 (after am symbicort) and DLC0 65  - PFT's 10/03/2012 1.95 (80%) ratio 62% and no better p B2,  DLCO 49%  - 10/03/2012   Walked RA x one lap @ 185 stopped due to  Fatigue > sob, no desat  As I explained to this patient in detail:  although there may be significant copd present, it does not appear to be limiting activity tolerance any more than a set of worn tires limits someone from driving a car  around a parking lot.  A new set of Michelins might look good but would have no perceived impact on the performance of the car and would not be worth the cost.  That is to say:   this pt is so sedentary I don't recommend aggressive pulmonary rx at this point unless limiting symptoms arise or acute exacerbations become as issue, neither of which are the case now.  I asked the patient to contact this office at any time in the future should either of these problems arise or we can see him in [place] office  Use of combivent is purely prn for now

## 2012-10-03 NOTE — Progress Notes (Signed)
Subjective:     Patient ID: Terry ASPINALL Sr., male   DOB: 1932-12-12  MRN: [id number]  HPI  42  yowm quit smoking  July 2013 eval by [facility] in 2006 with no def copd but improvement on albuterol   December 19, 2008 worse doe x 6 months, not much better after albuterol no problem sleeping, gets to mailbox and ok and really can't identify one activity where breathing stops him, he just slows down and keeps going, admitting he's more concerned with back and joint pain in terms of being limited. Minimal cough, thick white sputum, worse in ams but does not disturb sleep.  Try symbicort 160 2 puffs first thing in am and 2 puffs again in pm about 12 hours later and if like this better than proaire fill the prescription  Make every effort to committ to quit smoking   March 03, 2009 ov better ex tol on symbicort, no more need for proaire, rec continue symbicort> quit working so stopped it, preferred combivent prn   10/03/2012 ov/ referred back to pulmonary clinic by Dr Christell Constant for f/u copd s/p successful smoking cessation but breathing no better, sob more than room to room plus fatigue, worse when bends over.  No obvious daytime variabilty or assoc chronic cough or cp or chest tightness, subjective wheeze overt sinus or hb symptoms. No unusual exp hx or h/o childhood pna/ asthma or premature birth to his knowledge.   Sleeping ok without nocturnal  or early am exacerbation  of respiratory  c/o's or need for noct saba. Also denies any obvious fluctuation of symptoms with weather or environmental changes or other aggravating or alleviating factors except as outlined above   ROS  The following are not active complaints unless bolded sore throat, dysphagia, dental problems, itching, sneezing,  nasal congestion or excess/ purulent secretions, ear ache,   fever, chills, sweats, unintended wt loss, pleuritic or exertional cp, hemoptysis,  orthopnea pnd or leg swelling, presyncope, palpitations, heartburn, abdominal  pain, anorexia, nausea, vomiting, diarrhea  or change in bowel or urinary habits, change in stools or urine, dysuria,hematuria,  rash, arthralgias, visual complaints, headache, numbness weakness or ataxia or problems with walking or coordination,  change in mood/affect or memory.          Past Medical History:  NEPHROLITHIASIS (ICD-592.0)  INGUINAL HERNIA (ICD-550.90)  HYPERTENSION (ICD-401.9)  TOBACCO ABUSE (ICD-305.1)  DYSLIPIDEMIA (ICD-272.4)  VENTRICULAR TACHYCARDIA (ICD-427.1)  CORONARY ARTERY DISEASE (ICD-414.00)  COPD  - PFT's 02/22/05 FEV1 2.04 (76%) ratio 64 21% better after B2, DLC0 78%  - PFT's 03/03/09 1.57(62%) ratio 58 11% better after B2 (after am symbicort) and DLC0 65  - PFT's 10/03/2012 1.95 (80%) ratio 62% and no better p B2,  DLCO 49%      Review of Systems     Objective:   Physical Exam Stoic obese amb wm nad  Wt Readings from Last 3 Encounters:  10/03/12 210 lb (95.255 kg)  09/26/12 211 lb 14.4 oz (96.117 kg)  08/27/12 212 lb 4.9 oz (96.3 kg)   wt 213 12/18/08 > 216 March 03, 2009  HEENT: nl dentition, turbinates, and orophanx. Nl external ear canals without cough reflex  NECK : without JVD/Nodes/TM/ nl carotid upstrokes bilaterally  LUNGS: no acc muscle use, distant bs with mild increase exp time  CV: RRR no s3 or murmur or increase in P2, no edema  ABD: soft and nontender with nl excursion in the supine position. No bruits or organomegaly, bowel sounds nl  MS: warm without deformities, calf tenderness, cyanosis or clubbing  SKIN: warm and dry without lesions  NEURO: alert, approp, no deficits   cxr 08/29/12 *RADIOLOGY REPORT*  Clinical Data: Dyspnea.  CHEST - 2 VIEW  Comparison: August 27, 2012.  Findings: Cardiomediastinal silhouette appears normal. No acute  pulmonary disease is noted. Bony thorax is intact.  IMPRESSION:  No acute cardiopulmonary abnormality seen.     Assessment:          Plan:

## 2012-10-11 ENCOUNTER — Ambulatory Visit (HOSPITAL_COMMUNITY)
Admission: RE | Admit: 2012-10-11 | Discharge: 2012-10-11 | Disposition: A | Discharge status: Home / Self Care | Payer: Medicare Other | Source: Ambulatory Visit | Attending: Internal Medicine | Admitting: Internal Medicine

## 2012-10-11 ENCOUNTER — Encounter (HOSPITAL_COMMUNITY)
Admission: RE | Disposition: A | Discharge status: Home / Self Care | Payer: Self-pay | Source: Ambulatory Visit | Attending: Internal Medicine

## 2012-10-11 ENCOUNTER — Encounter (HOSPITAL_COMMUNITY): Payer: Self-pay | Admitting: *Deleted

## 2012-10-11 DIAGNOSIS — K644 Residual hemorrhoidal skin tags: Secondary | ICD-10-CM | POA: Insufficient documentation

## 2012-10-11 DIAGNOSIS — K921 Melena: Secondary | ICD-10-CM | POA: Insufficient documentation

## 2012-10-11 DIAGNOSIS — K625 Hemorrhage of anus and rectum: Secondary | ICD-10-CM

## 2012-10-11 DIAGNOSIS — I10 Essential (primary) hypertension: Secondary | ICD-10-CM | POA: Insufficient documentation

## 2012-10-11 DIAGNOSIS — K5730 Diverticulosis of large intestine without perforation or abscess without bleeding: Secondary | ICD-10-CM

## 2012-10-11 DIAGNOSIS — J4489 Other specified chronic obstructive pulmonary disease: Secondary | ICD-10-CM | POA: Insufficient documentation

## 2012-10-11 DIAGNOSIS — D128 Benign neoplasm of rectum: Secondary | ICD-10-CM | POA: Insufficient documentation

## 2012-10-11 DIAGNOSIS — J449 Chronic obstructive pulmonary disease, unspecified: Secondary | ICD-10-CM | POA: Insufficient documentation

## 2012-10-11 DIAGNOSIS — D126 Benign neoplasm of colon, unspecified: Secondary | ICD-10-CM

## 2012-10-11 HISTORY — PX: COLONOSCOPY: SHX5424

## 2012-10-11 SURGERY — COLONOSCOPY
Anesthesia: Moderate Sedation

## 2012-10-11 MED ORDER — MEPERIDINE HCL 50 MG/ML IJ SOLN
INTRAMUSCULAR | Status: DC | PRN
  Administered 2012-10-11: 20 mg via INTRAVENOUS

## 2012-10-11 MED ORDER — MIDAZOLAM HCL 5 MG/5ML IJ SOLN
INTRAMUSCULAR | Status: AC
  Filled 2012-10-11: qty 10

## 2012-10-11 MED ORDER — SODIUM CHLORIDE 0.45 % IV SOLN
INTRAVENOUS | Status: DC
  Administered 2012-10-11: 1000 mL via INTRAVENOUS

## 2012-10-11 MED ORDER — MEPERIDINE HCL 50 MG/ML IJ SOLN
INTRAMUSCULAR | Status: AC
  Filled 2012-10-11: qty 1

## 2012-10-11 MED ORDER — ALBUTEROL SULFATE (5 MG/ML) 0.5% IN NEBU
INHALATION_SOLUTION | RESPIRATORY_TRACT | Status: AC
  Filled 2012-10-11: qty 0.5

## 2012-10-11 MED ORDER — MIDAZOLAM HCL 5 MG/5ML IJ SOLN
INTRAMUSCULAR | Status: DC | PRN
  Administered 2012-10-11: 2 mg via INTRAVENOUS
  Administered 2012-10-11 (×2): 1 mg via INTRAVENOUS

## 2012-10-11 MED ORDER — ALBUTEROL SULFATE (5 MG/ML) 0.5% IN NEBU
2.5000 mg | INHALATION_SOLUTION | Freq: Once | RESPIRATORY_TRACT | Status: AC
  Administered 2012-10-11: 2.5 mg via RESPIRATORY_TRACT

## 2012-10-11 MED ORDER — SIMETHICONE 40 MG/0.6ML PO SUSP
ORAL | Status: DC | PRN
  Administered 2012-10-11: 13:00:00

## 2012-10-11 NOTE — Op Note (Signed)
COLONOSCOPY PROCEDURE REPORT  PATIENT:  Terry QUINTANAR Sr.  MR#:  [id number] Birthdate:  [birth year], 79 y.o., male Endoscopist:  Dr. Malissa Hippo, MD Referred By:  Dr. [name], MD Procedure Date: 10/11/2012  Procedure:   Colonoscopy with snare polypectomy.  Indications:  Patient is 79 year old Caucasian male who is undergoing diagnostic colonoscopy because of heme-positive stools and single episode of hematochezia. He states one of his brothers was diagnosed with colon carcinoma at age 9 and now has metastatic disease at age 77.  Informed Consent:  The procedure and risks were reviewed with the patient and informed consent was obtained.  Medications:  Demerol 20 mg IV Versed 4 mg IV  Description of procedure:  After a digital rectal exam was performed, that colonoscope was advanced from the anus through the rectum and colon to the area of the cecum, ileocecal valve and appendiceal orifice. The cecum was deeply intubated. These structures were well-seen and photographed for the record. From the level of the cecum and ileocecal valve, the scope was slowly and cautiously withdrawn. The mucosal surfaces were carefully surveyed utilizing scope tip to flexion to facilitate fold flattening as needed. The scope was pulled down into the rectum where a thorough exam including retroflexion was performed.  Findings:   Prep satisfactory. Scattered diverticula throughout the colon. 8 mm polyp snared from ileocecal valve. Two small polyps ablated via cold biopsy and submitted together(cecum and ascending colon). Small polyp at rectum was coagulated using snare tip. Small hemorrhoids below the dentate line.  Therapeutic/Diagnostic Maneuvers Performed:  See above  Complications:  None  Cecal Withdrawal Time:  28 minutes  Impression:  Examination performed to cecum. Pancolonic diverticulosis. 8 mm polyp snared from ileocecal valve. Two small polyps are ablated via cold biopsy and  submitted together. Small rectal polyp coagulated. Small external hemorrhoids.  Recommendations:  Standard instructions given. I will contact patient with biopsy results and further recommendations.  Norm Wray U  10/11/2012 2:06 PM  CC: Dr. [name], MD & Dr. Bonnetta Barry ref. provider found         Dr. Henderson Cloud, MD [facility])

## 2012-10-11 NOTE — H&P (Signed)
Terry Sweeden Pinegar Sr. is an 79 y.o. male.   Chief Complaint: Patient is here for colonoscopy. HPI: Patient is 79 year old Caucasian male with multiple medical problems who had single episode of hematochezia several weeks ago. He was seen in cirrhotic family medicine and noted to have heme positive stools x2. He is therefore undergoing diagnostic colonoscopy. He denies abdominal pain diarrhea or constipation. Presently he denies shortness of breath. He was given albuterol lab in preop area. He has COPD. Family history is positive for colon carcinoma in his brother who was found 60 at the time of diagnosis and now has metastatic disease at 87.  Past Medical History  Diagnosis Date  . Back pain     DJD  . Hypertension   . Arteriosclerotic cardiovascular disease (ASCVD)     stent to RCA 02-08-1996 and 02-08-03  . COPD (chronic obstructive pulmonary disease)   . GERD (gastroesophageal reflux disease)   . Headache     chronic  . Chronic renal insufficiency     baseline Creatinine-1.4  . Anxiety and depression 1996    Chronic since wife's death in 02-08-1995  . Cognitive decline 2010/02/07    progressive since 2010/02/07, question of dementia  . Hyperlipidemia   . Cholelithiasis     Incidentally noted  . Heart attack     x2  . Stroke July 2013    Past Surgical History  Procedure Date  . Coronary stent placement 1997    RCA  . Coronary stent placement 02-08-03    in-stent restenosis, stent replaced RCA.      Family History  Problem Relation Age of Onset  . Throat cancer Father     Smoker  . Heart disease Brother   . Asthma Son   . Heart disease Brother    Social History:  reports that he quit smoking about 6 months ago. His smoking use included Cigarettes. He has a 50 pack-year smoking history. He does not have any smokeless tobacco history on file. He reports that he does not drink alcohol or use illicit drugs.  Allergies:  Allergies  Allergen Reactions  . Statins Other (See Comments)    REACTION:  MYALGIA    Medications Prior to Admission  Medication Sig Dispense Refill  . acetaminophen (TYLENOL) 500 MG tablet Take 1 tablet (500 mg total) by mouth every 6 (six) hours as needed. For pain.  30 tablet  0  . amLODipine (NORVASC) 10 MG tablet Take 10 mg by mouth daily.        . clopidogrel (PLAVIX) 75 MG tablet Take 75 mg by mouth daily.      . Febuxostat (ULORIC) 80 MG TABS Take 80 mg by mouth daily.       . Ipratropium-Albuterol (COMBIVENT) 20-100 MCG/ACT AERS respimat Inhale 2 puffs into the lungs 3 (three) times daily.      Marland Kitchen LORazepam (ATIVAN) 1 MG tablet Take 0.5 tablets (0.5 mg total) by mouth 3 (three) times daily as needed for anxiety.  30 tablet  0  . Memantine HCl ER (NAMENDA XR) 28 MG CP24 Take 1 tablet by mouth every evening.      Marland Kitchen PARoxetine (PAXIL) 40 MG tablet Take 40 mg by mouth every morning.      . peg 3350 powder (MOVIPREP) 100 G SOLR Take 1 kit (100 g total) by mouth once.  1 kit  0  . risperiDONE (RISPERDAL) 0.25 MG tablet Take 1 tablet (0.25 mg total) by mouth daily.  30 tablet  0  . risperiDONE (RISPERDAL) 0.5 MG tablet Take 1 tablet (0.5 mg total) by mouth daily.  30 tablet  0  . rivastigmine (EXELON) 4.6 mg/24hr Place 1 patch onto the skin daily.      . Tamsulosin HCl (FLOMAX) 0.4 MG CAPS Take 1 capsule (0.4 mg total) by mouth daily.  30 capsule    . traMADol (ULTRAM) 50 MG tablet Take 1 tablet (50 mg total) by mouth every 6 (six) hours as needed. For pain  30 tablet  0  . traZODone (DESYREL) 50 MG tablet Take 50 mg by mouth at bedtime.        No results found for this or any previous visit (from the past 48 hour(s)). No results found.  ROS  Blood pressure 125/71, pulse 69, temperature 97.8 F (36.6 C), temperature source Oral, resp. rate 20, SpO2 94.00%. Physical Exam  Constitutional: He appears well-developed and well-nourished.  HENT:  Mouth/Throat: Oropharynx is clear and moist.  Eyes: Conjunctivae normal are normal. No scleral icterus.  Neck: No  thyromegaly present.  Cardiovascular: Normal rate, regular rhythm and normal heart sounds.   No murmur heard. Respiratory: Effort normal.       Diminished intensity of breath sounds bilaterally but no rales or rhonchi noted.  GI: He exhibits no distension and no mass. There is no tenderness.       Small umbilicus hernia with slight skin discoloration  Musculoskeletal: Edema: trace edema left leg.  Lymphadenopathy:    He has no cervical adenopathy.  Neurological: He is alert.  Skin: Skin is warm and dry.     Assessment/Plan Heme positive stools. Single episode of hematochezia. Diagnostic colonoscopy.  REHMAN,NAJEEB U 10/11/2012, 1:07 PM

## 2012-10-12 ENCOUNTER — Other Ambulatory Visit: Payer: Self-pay | Admitting: Internal Medicine

## 2012-10-12 DIAGNOSIS — J449 Chronic obstructive pulmonary disease, unspecified: Secondary | ICD-10-CM

## 2012-10-16 ENCOUNTER — Encounter (HOSPITAL_COMMUNITY): Payer: Self-pay | Admitting: Internal Medicine

## 2012-10-20 ENCOUNTER — Encounter (INDEPENDENT_AMBULATORY_CARE_PROVIDER_SITE_OTHER): Payer: Self-pay | Admitting: *Deleted

## 2012-10-23 ENCOUNTER — Encounter: Payer: Self-pay | Admitting: Internal Medicine

## 2012-10-24 ENCOUNTER — Encounter (INDEPENDENT_AMBULATORY_CARE_PROVIDER_SITE_OTHER): Payer: Self-pay

## 2012-10-30 NOTE — ED Provider Notes (Signed)
History     CSN: [id number]  Arrival date & time 08/27/12  1001   First MD Initiated Contact with Patient 08/27/12 1009      Chief Complaint  Patient presents with  . Difficulty Walking    (Consider location/radiation/quality/duration/timing/severity/associated sxs/prior treatment) HPI Comments: Patient was brought in by his daughter in law for generalized weakness x 1 day.  Daughter notes he has been eating and drinking less than usual and walking less than usual.  States this tends to happen when he has "some type of infection" and though he might have a UTI.  Patient states he was unable to get out of bed by himself this morning.  States he could stand up and did so several times ,but then wasn't able to move forward and would sit back down on the bed.  He is unable to say whether he felt weak all over or if his legs were not responding.  Denies fever, recent illness, chest pain, shortness of breath, abdominal pain.      The history is provided by the patient and a relative.    Past Medical History  Diagnosis Date  . Back pain     DJD  . Hypertension   . Arteriosclerotic cardiovascular disease (ASCVD)     stent to RCA Feb 27, 1996 and 02/27/03  . COPD (chronic obstructive pulmonary disease)   . GERD (gastroesophageal reflux disease)   . Headache     chronic  . Chronic renal insufficiency     baseline Creatinine-1.4  . Anxiety and depression 1996    Chronic since wife's death in 1995-02-27  . Cognitive decline 2010/02/26    progressive since 26-Feb-2010, question of dementia  . Hyperlipidemia   . Cholelithiasis     Incidentally noted  . Heart attack     x2  . Stroke July 2013    Past Surgical History  Procedure Laterality Date  . Coronary stent placement  1997    RCA  . Coronary stent placement  02-27-2003    in-stent restenosis, stent replaced RCA.    . Colonoscopy  10/11/2012    Procedure: COLONOSCOPY;  Surgeon: Malissa Hippo, MD;  Location: [facility];  Service: Endoscopy;  Laterality: N/A;   100    Family History  Problem Relation Age of Onset  . Throat cancer Father     Smoker  . Heart disease Brother   . Asthma Son   . Heart disease Brother     History  Substance Use Topics  . Smoking status: Former Smoker -- 1.00 packs/day for 50 years    Types: Cigarettes    Quit date: 03/24/2012  . Smokeless tobacco: Not on file  . Alcohol Use: No      Review of Systems  Unable to perform ROS: Dementia    Allergies  Statins  Home Medications   Current Outpatient Rx  Name  Route  Sig  Dispense  Refill  . amLODipine (NORVASC) 10 MG tablet   Oral   Take 10 mg by mouth daily.           . clopidogrel (PLAVIX) 75 MG tablet   Oral   Take 75 mg by mouth daily.         . Febuxostat (ULORIC) 80 MG TABS   Oral   Take 80 mg by mouth daily.          . Ipratropium-Albuterol (COMBIVENT) 20-100 MCG/ACT AERS respimat   Inhalation   Inhale 2 puffs into the lungs  3 (three) times daily.         . Memantine HCl ER (NAMENDA XR) 28 MG CP24   Oral   Take 1 tablet by mouth every evening.         Marland Kitchen PARoxetine (PAXIL) 40 MG tablet   Oral   Take 40 mg by mouth every morning.         . rivastigmine (EXELON) 4.6 mg/24hr   Transdermal   Place 1 patch onto the skin daily.         . Tamsulosin HCl (FLOMAX) 0.4 MG CAPS   Oral   Take 1 capsule (0.4 mg total) by mouth daily.   30 capsule      . traZODone (DESYREL) 50 MG tablet   Oral   Take 50 mg by mouth at bedtime.         Marland Kitchen acetaminophen (TYLENOL) 500 MG tablet   Oral   Take 1 tablet (500 mg total) by mouth every 6 (six) hours as needed. For pain.   30 tablet   0   . LORazepam (ATIVAN) 1 MG tablet   Oral   Take 0.5 tablets (0.5 mg total) by mouth 3 (three) times daily as needed for anxiety.   30 tablet   0   . peg 3350 powder (MOVIPREP) 100 G SOLR   Oral   Take 1 kit (100 g total) by mouth once.   1 kit   0   . risperiDONE (RISPERDAL) 0.25 MG tablet   Oral   Take 1 tablet (0.25 mg total) by  mouth daily.   30 tablet   0   . risperiDONE (RISPERDAL) 0.5 MG tablet   Oral   Take 1 tablet (0.5 mg total) by mouth daily.   30 tablet   0   . traMADol (ULTRAM) 50 MG tablet   Oral   Take 1 tablet (50 mg total) by mouth every 6 (six) hours as needed. For pain   30 tablet   0     BP 111/71  Pulse 76  Temp(Src) 97.9 F (36.6 C) (Oral)  Resp 20  Ht 6' (1.829 m)  Wt 212 lb 4.9 oz (96.3 kg)  BMI 28.79 kg/m2  SpO2 91%  Physical Exam  Nursing note and vitals reviewed. Constitutional: He appears well-developed and well-nourished. No distress.  HENT:  Head: Normocephalic and atraumatic.  Neck: Normal range of motion. Neck supple.  Cardiovascular: Normal rate, regular rhythm and intact distal pulses.   Pulmonary/Chest: Effort normal. No respiratory distress. He has wheezes. He has no rales.  Abdominal: Soft. He exhibits no distension and no mass. There is no tenderness. There is no rebound and no guarding.  Musculoskeletal: He exhibits no edema and no tenderness.  Neurological: He is alert. No sensory deficit. He exhibits normal muscle tone. GCS eye subscore is 4. GCS verbal subscore is 5. GCS motor subscore is 6.  Pt is able to move all extremities.   Skin: He is not diaphoretic.    ED Course  Procedures (including critical care time)  Labs Reviewed  CBC WITH DIFFERENTIAL - Abnormal; Notable for the following:    Hemoglobin 12.6 (*)    All other components within normal limits  COMPREHENSIVE METABOLIC PANEL - Abnormal; Notable for the following:    BUN 55 (*)    Creatinine, Ser 2.67 (*)    Albumin 3.4 (*)    Total Bilirubin 0.2 (*)    GFR calc non Af Amer 21 (*)  GFR calc Af Amer 25 (*)    All other components within normal limits  COMPREHENSIVE METABOLIC PANEL - Abnormal; Notable for the following:    Glucose, Bld 129 (*)    BUN 43 (*)    Creatinine, Ser 2.06 (*)    Albumin 2.9 (*)    Total Bilirubin 0.2 (*)    GFR calc non Af Amer 29 (*)    GFR calc Af  Amer 34 (*)    All other components within normal limits  CBC - Abnormal; Notable for the following:    RBC 3.86 (*)    Hemoglobin 11.0 (*)    HCT 34.4 (*)    All other components within normal limits  URINE CULTURE  URINALYSIS, ROUTINE W REFLEX MICROSCOPIC  TROPONIN I  PRO B NATRIURETIC PEPTIDE   No results found.   1. Generalized weakness   2. Dehydration   3. Renal insufficiency   4. Chronic obstructive pulmonary disease   5. Chronic renal insufficiency   6. Anemia   7. Cognitive decline   8. TOBACCO ABUSE   9. Hypertension   10. NEPHROLITHIASIS   11. GERD (gastroesophageal reflux disease)   12. CVA (cerebral infarction)   13. Arteriosclerotic cardiovascular disease (ASCVD)   14. Hyperlipidemia       MDM  Patient seen by me in December.  Initial note was deleted from the system.  I spoke at length with the patient and family members and discussed all results and plan with them.  Pt did have wheezing on exam - has COPD likely with wheezing at baseline.  Though pt denied CP and SOB, I ordered neb treatments which improved the wheezing and pt admitted to "feeling better" even though he did not complain of SOB initially.  Pt was "unable" to urinate, likely due to dehydration, as he did not have excessive amount of urine in his bladder.  Pt was given IVF during ED stay.  Head CT, CXR, UA negative for acute process.  WBC normal.  Mild anemia.  Labs remarkable for worsening renal function.  Pt needing more assistance at home than family was able to provide.  Decision was made in discussion with Dr Jeraldine Loots and with the patient and family to admit the patient.  Family members verbalize understanding and agree with plan.  Patient agrees with plan.  Pt admitted to [facility] hospitalist service.          [employer], [place] 10/30/12 1143

## 2012-11-01 NOTE — ED Provider Notes (Signed)
This note was filed late, due to computer issues.  This was a shared encounter for a patient with increasing weakness and multiple medical issues who required admission for further E/M.  Gerhard Munch, MD 11/01/12 1123

## 2012-11-08 IMAGING — CR DG CHEST 2V
1 series · 1 of 1 positions shown · non-contrast
Comparison: 08/27/2011

CLINICAL DATA: Syncope, shortness of breath.  COPD.

CHEST - 2 VIEW

[view not recorded]
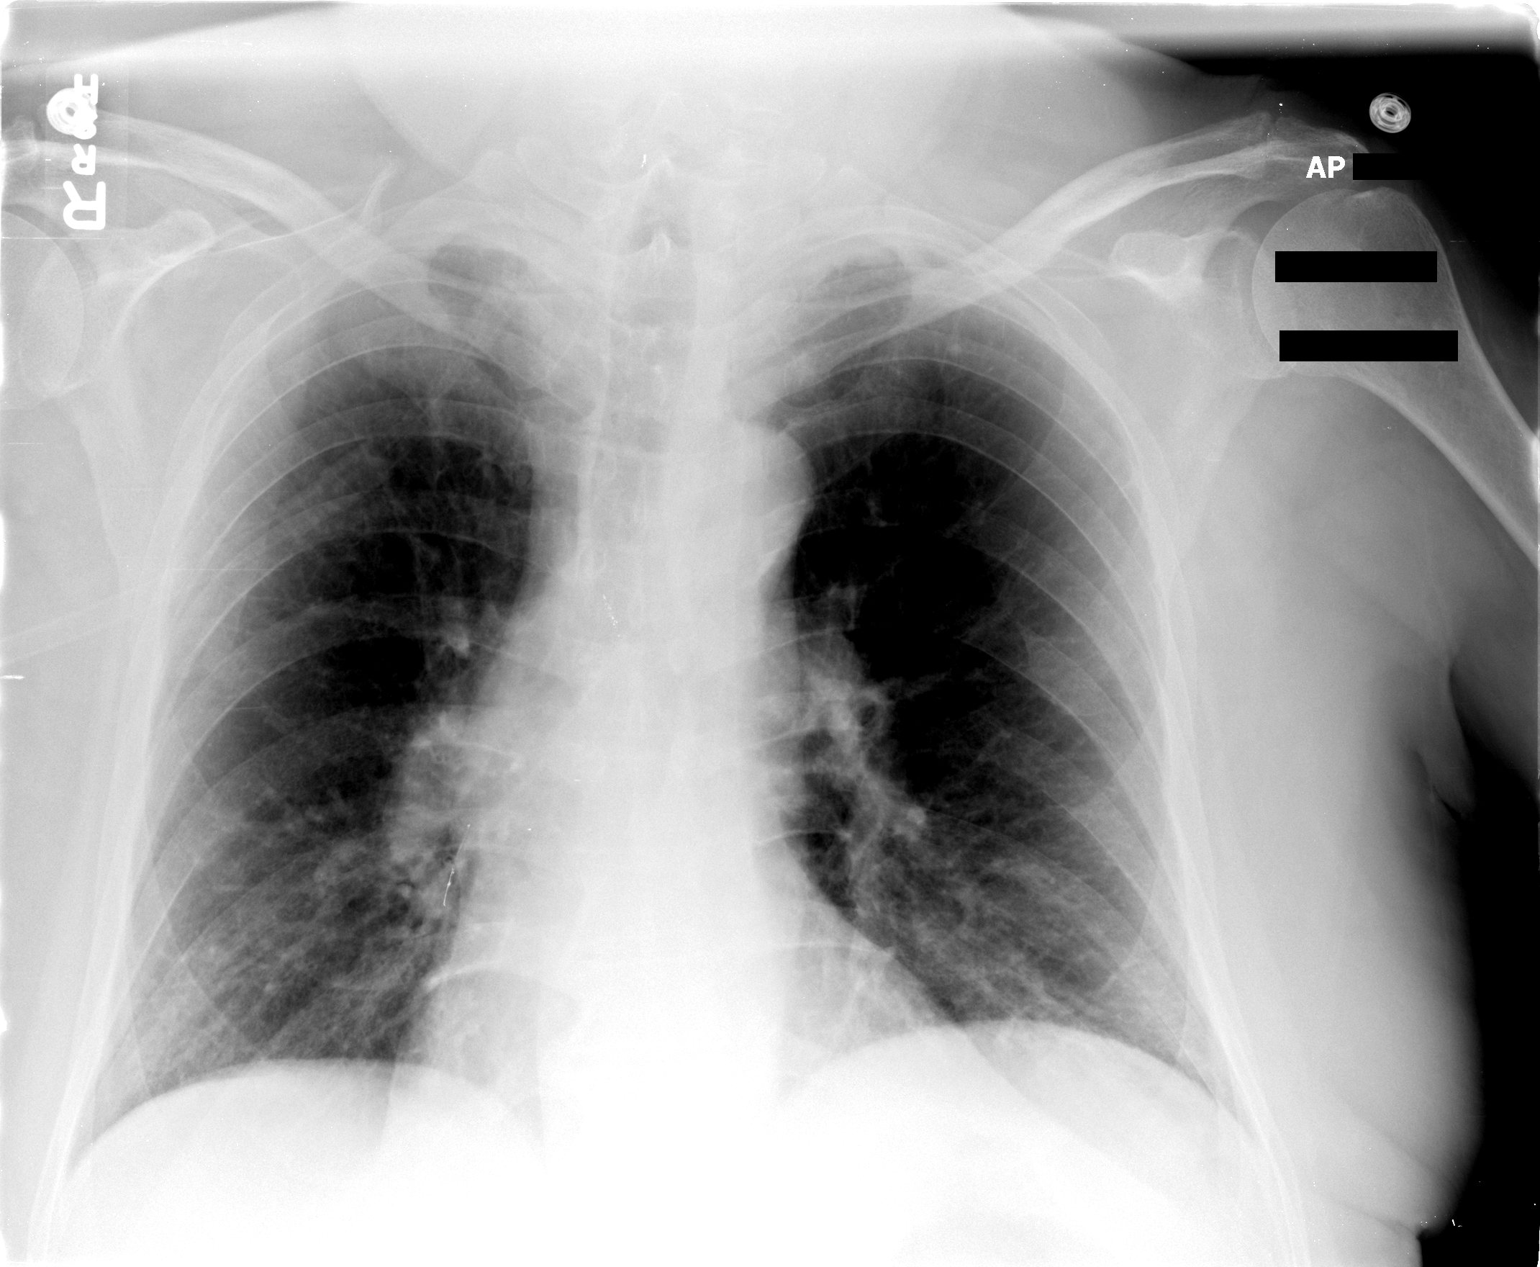

[1 of 1 positions shown; findings below may reference images not displayed]

FINDINGS: Minimal bibasilar atelectasis.  Heart is normal size.  No
effusions or acute bony abnormality.
IMPRESSION: No active disease.

## 2012-12-05 ENCOUNTER — Other Ambulatory Visit: Payer: Self-pay | Admitting: Family Medicine

## 2012-12-05 NOTE — Telephone Encounter (Signed)
ok'd by Dr Christell Constant

## 2012-12-06 ENCOUNTER — Telehealth: Payer: Self-pay | Admitting: *Deleted

## 2012-12-06 NOTE — Telephone Encounter (Signed)
Received a refill request from [facility] for seroquel. Visit on 11-23-12 shows med was d/c. Please advise. thanks

## 2012-12-07 NOTE — Telephone Encounter (Signed)
Patient is no longer on seroquel. 

## 2012-12-07 NOTE — Telephone Encounter (Signed)
Will send to dr Buel Ream nurse

## 2012-12-13 ENCOUNTER — Emergency Department (HOSPITAL_COMMUNITY)
Admission: EM | Admit: 2012-12-13 | Discharge: 2012-12-13 | Disposition: A | Discharge status: Home / Self Care | Payer: Medicare Other | Attending: Emergency Medicine | Admitting: Emergency Medicine

## 2012-12-13 ENCOUNTER — Emergency Department (HOSPITAL_COMMUNITY): Payer: Medicare Other

## 2012-12-13 DIAGNOSIS — Z8673 Personal history of transient ischemic attack (TIA), and cerebral infarction without residual deficits: Secondary | ICD-10-CM | POA: Insufficient documentation

## 2012-12-13 DIAGNOSIS — Z8719 Personal history of other diseases of the digestive system: Secondary | ICD-10-CM | POA: Insufficient documentation

## 2012-12-13 DIAGNOSIS — R062 Wheezing: Secondary | ICD-10-CM | POA: Insufficient documentation

## 2012-12-13 DIAGNOSIS — F068 Other specified mental disorders due to known physiological condition: Secondary | ICD-10-CM | POA: Insufficient documentation

## 2012-12-13 DIAGNOSIS — Z8739 Personal history of other diseases of the musculoskeletal system and connective tissue: Secondary | ICD-10-CM | POA: Insufficient documentation

## 2012-12-13 DIAGNOSIS — N289 Disorder of kidney and ureter, unspecified: Secondary | ICD-10-CM

## 2012-12-13 DIAGNOSIS — J4489 Other specified chronic obstructive pulmonary disease: Secondary | ICD-10-CM | POA: Insufficient documentation

## 2012-12-13 DIAGNOSIS — J441 Chronic obstructive pulmonary disease with (acute) exacerbation: Secondary | ICD-10-CM

## 2012-12-13 DIAGNOSIS — Z87891 Personal history of nicotine dependence: Secondary | ICD-10-CM | POA: Insufficient documentation

## 2012-12-13 DIAGNOSIS — E785 Hyperlipidemia, unspecified: Secondary | ICD-10-CM | POA: Insufficient documentation

## 2012-12-13 DIAGNOSIS — J449 Chronic obstructive pulmonary disease, unspecified: Secondary | ICD-10-CM | POA: Insufficient documentation

## 2012-12-13 DIAGNOSIS — N189 Chronic kidney disease, unspecified: Secondary | ICD-10-CM | POA: Insufficient documentation

## 2012-12-13 DIAGNOSIS — Z8679 Personal history of other diseases of the circulatory system: Secondary | ICD-10-CM | POA: Insufficient documentation

## 2012-12-13 DIAGNOSIS — F411 Generalized anxiety disorder: Secondary | ICD-10-CM | POA: Insufficient documentation

## 2012-12-13 DIAGNOSIS — R51 Headache: Secondary | ICD-10-CM | POA: Insufficient documentation

## 2012-12-13 DIAGNOSIS — Z79899 Other long term (current) drug therapy: Secondary | ICD-10-CM | POA: Insufficient documentation

## 2012-12-13 DIAGNOSIS — F329 Major depressive disorder, single episode, unspecified: Secondary | ICD-10-CM | POA: Insufficient documentation

## 2012-12-13 DIAGNOSIS — I129 Hypertensive chronic kidney disease with stage 1 through stage 4 chronic kidney disease, or unspecified chronic kidney disease: Secondary | ICD-10-CM | POA: Insufficient documentation

## 2012-12-13 DIAGNOSIS — F3289 Other specified depressive episodes: Secondary | ICD-10-CM | POA: Insufficient documentation

## 2012-12-13 LAB — BASIC METABOLIC PANEL
BUN: 34 mg/dL — ABNORMAL HIGH (ref 6–23)
Chloride: 99 mEq/L (ref 96–112)
GFR calc non Af Amer: 34 mL/min — ABNORMAL LOW (ref >90)
Glucose, Bld: 113 mg/dL — ABNORMAL HIGH (ref 70–99)
Potassium: 4.2 mEq/L (ref 3.5–5.1)
Sodium: 134 mEq/L — ABNORMAL LOW (ref 135–145)

## 2012-12-13 LAB — PRO B NATRIURETIC PEPTIDE: Pro B Natriuretic peptide (BNP): 119.1 pg/mL (ref 0–450)

## 2012-12-13 LAB — CBC
HCT: 37.5 % — ABNORMAL LOW (ref 39.0–52.0)
Hemoglobin: 11.6 g/dL — ABNORMAL LOW (ref 13.0–17.0)
MCHC: 30.9 g/dL (ref 30.0–36.0)
RBC: 4.18 MIL/uL — ABNORMAL LOW (ref 4.22–5.81)
WBC: 7.6 10*3/uL (ref 4.0–10.5)

## 2012-12-13 LAB — URINALYSIS, ROUTINE W REFLEX MICROSCOPIC
Hgb urine dipstick: NEGATIVE
Nitrite: NEGATIVE
Specific Gravity, Urine: 1.018 (ref 1.005–1.030)
Urobilinogen, UA: 0.2 mg/dL (ref 0.0–1.0)
pH: 5.5 (ref 5.0–8.0)

## 2012-12-13 LAB — TROPONIN I: Troponin I: <0.3 ng/mL (ref <0.30)

## 2012-12-13 LAB — URINE MICROSCOPIC-ADD ON

## 2012-12-13 MED ORDER — PREDNISONE 50 MG PO TABS: 50.0000 mg | ORAL_TABLET | Freq: Every day | ORAL | Status: DC

## 2012-12-13 MED ORDER — IPRATROPIUM BROMIDE 0.02 % IN SOLN
0.5000 mg | Freq: Once | RESPIRATORY_TRACT | Status: AC
  Administered 2012-12-13: 0.5 mg via RESPIRATORY_TRACT
  Filled 2012-12-13: qty 2.5

## 2012-12-13 MED ORDER — SODIUM CHLORIDE 0.9 % IV BOLUS (SEPSIS)
500.0000 mL | Freq: Once | INTRAVENOUS | Status: AC
  Administered 2012-12-13: 500 mL via INTRAVENOUS

## 2012-12-13 MED ORDER — PREDNISONE 20 MG PO TABS
60.0000 mg | ORAL_TABLET | Freq: Once | ORAL | Status: AC
  Administered 2012-12-13: 60 mg via ORAL
  Filled 2012-12-13: qty 3

## 2012-12-13 MED ORDER — ALBUTEROL SULFATE (5 MG/ML) 0.5% IN NEBU
5.0000 mg | INHALATION_SOLUTION | Freq: Once | RESPIRATORY_TRACT | Status: AC
  Administered 2012-12-13: 5 mg via RESPIRATORY_TRACT
  Filled 2012-12-13: qty 1

## 2012-12-13 NOTE — ED Provider Notes (Signed)
History     CSN: [id number]  Arrival date & time 12/13/12  1818   First MD Initiated Contact with Patient 12/13/12 1848      Chief Complaint  Patient presents with  . Dehydration  . Headache    (Consider location/radiation/quality/duration/timing/severity/associated sxs/prior treatment) HPI A LEVEL 5 CAVEAT PERTAINS DUE TO DEMENTIA Pt with hx of dementia.  Per caregiver he has not been drinking as much liquids as usual and his urine appears darker.  No fever, no vomiting.  Has also had some wheezing- hx of COPD.  Pt has hx of renal failure and caregiver concerned that this may be worsening  Past Medical History  Diagnosis Date  . Back pain     DJD  . Hypertension   . Arteriosclerotic cardiovascular disease (ASCVD)     stent to RCA Feb 01, 1996 and 02-01-2003  . COPD (chronic obstructive pulmonary disease)   . GERD (gastroesophageal reflux disease)   . Headache     chronic  . Chronic renal insufficiency     baseline Creatinine-1.4  . Anxiety and depression 1996    Chronic since wife's death in 1995-02-01  . Cognitive decline Jan 31, 2010    progressive since 2010-01-31, question of dementia  . Hyperlipidemia   . Cholelithiasis     Incidentally noted  . Heart attack     x2  . Stroke July 2013    Past Surgical History  Procedure Laterality Date  . Coronary stent placement  1997    RCA  . Coronary stent placement  02/01/03    in-stent restenosis, stent replaced RCA.    . Colonoscopy  10/11/2012    Procedure: COLONOSCOPY;  Surgeon: Malissa Hippo, MD;  Location: [facility];  Service: Endoscopy;  Laterality: N/A;  100    Family History  Problem Relation Age of Onset  . Throat cancer Father     Smoker  . Heart disease Brother   . Asthma Son   . Heart disease Brother     History  Substance Use Topics  . Smoking status: Former Smoker -- 1.00 packs/day for 50 years    Types: Cigarettes    Quit date: 03/24/2012  . Smokeless tobacco: Not on file  . Alcohol Use: No      Review of  Systems ROS reviewed and all otherwise negative except for mentioned in HPI  Allergies  Statins  Home Medications   Current Outpatient Rx  Name  Route  Sig  Dispense  Refill  . amLODipine (NORVASC) 10 MG tablet   Oral   Take 10 mg by mouth daily.           . clopidogrel (PLAVIX) 75 MG tablet   Oral   Take 75 mg by mouth daily.         . Febuxostat (ULORIC) 80 MG TABS   Oral   Take 80 mg by mouth daily.          . Ipratropium-Albuterol (COMBIVENT) 20-100 MCG/ACT AERS respimat   Inhalation   Inhale 2 puffs into the lungs 3 (three) times daily.         Marland Kitchen LORazepam (ATIVAN) 1 MG tablet   Oral   Take 0.5 tablets (0.5 mg total) by mouth 3 (three) times daily as needed for anxiety.   30 tablet   0   . Memantine HCl ER (NAMENDA XR) 28 MG CP24   Oral   Take 1 tablet by mouth every evening.         Marland Kitchen  PARoxetine (PAXIL) 40 MG tablet   Oral   Take 40 mg by mouth every morning.         . risperiDONE (RISPERDAL) 0.5 MG tablet   Oral   Take 1 tablet (0.5 mg total) by mouth daily.   30 tablet   0   . rivastigmine (EXELON) 4.6 mg/24hr   Transdermal   Place 1 patch onto the skin daily.         . Tamsulosin HCl (FLOMAX) 0.4 MG CAPS   Oral   Take 1 capsule (0.4 mg total) by mouth daily.   30 capsule      . traZODone (DESYREL) 50 MG tablet   Oral   Take 50 mg by mouth at bedtime.         . predniSONE (DELTASONE) 50 MG tablet   Oral   Take 1 tablet (50 mg total) by mouth daily.   10 tablet   0     BP 139/72  Pulse 73  Temp(Src) 97.8 F (36.6 C) (Oral)  Resp 15  SpO2 93% Vitals reviewed Physical Exam Physical Examination: General appearance - alert, well appearing, and in no distress Mental status - alert, oriented to person, not to place or time Eyes - no conjunctival injection, no scleral icterus Mouth - mucous membranes tacky, OP without lesions Chest - clear to auscultation, bilateral expiratory wheezes, no rales or rhonchi, symmetric air  entry Heart - normal rate, regular rhythm, normal S1, S2, no murmurs, rubs, clicks or gallops Abdomen - soft, nontender, nondistended, no masses or organomegaly Back exam - no midline tenderness to palpation, no CVA tenderness Neurological - alert, oriented x 1, no focal weakness, normal speech Extremities - peripheral pulses normal, no pedal edema, no clubbing or cyanosis Skin - normal coloration and turgor, no rashes  ED Course  Procedures (including critical care time)    Date: 12/13/2012  Rate: 66  Rhythm: normal sinus rhythm  QRS Axis: normal  Intervals: normal  ST/T Wave abnormalities: normal  Conduction Disutrbances: none  Narrative Interpretation: unremarkable, no significant changes compared to prior ekg of 08/27/12      Labs Reviewed  CBC - Abnormal; Notable for the following:    RBC 4.18 (*)    Hemoglobin 11.6 (*)    HCT 37.5 (*)    All other components within normal limits  BASIC METABOLIC PANEL - Abnormal; Notable for the following:    Sodium 134 (*)    Glucose, Bld 113 (*)    BUN 34 (*)    Creatinine, Ser 1.82 (*)    GFR calc non Af Amer 34 (*)    GFR calc Af Amer 40 (*)    All other components within normal limits  URINALYSIS, ROUTINE W REFLEX MICROSCOPIC - Abnormal; Notable for the following:    Leukocytes, UA TRACE (*)    All other components within normal limits  PRO B NATRIURETIC PEPTIDE  TROPONIN I  URINE MICROSCOPIC-ADD ON   Dg Chest 2 View  12/13/2012  *RADIOLOGY REPORT*  Clinical Data: Cough, weakness, shortness of breath  CHEST - 2 VIEW  Comparison: 08/29/2012  Findings: Mild enlargement cardiomediastinal silhouette noted with central vascular prominence but no overt edema. Diffusely increased interstitial lung markings noted, without focal pulmonary opacity. Bibasilar scarring or atelectasis is stable.  No new focal pulmonary opacity.  No pleural effusion.  No acute osseous finding.  IMPRESSION: Borderline cardiomegaly with chronically prominent  interstitial markings but no new focal acute finding.   Original Report  Authenticated By: Christiana Pellant, M.D.      1. COPD exacerbation   2. Renal insufficiency       MDM  Pt presnting with c/o decreased po intake.  Labs are at his baseline- renal insufficiency without much change.  Also noted to be wheezing mildly.  CXR without acute finding.  Xray images reviewed and interpreted by me as well.  Pt given albuterol/atrovent and steroid course given.  Pt also treated with IV hydration.  Discharged with strict return precautions.  Pt agreeable with plan.        Ethelda Chick, MD 12/14/12 [id number]

## 2012-12-13 NOTE — ED Notes (Signed)
Bedside report received from previous RN 

## 2012-12-13 NOTE — ED Notes (Signed)
[id number]> Expected date:<BR> Expected time:<BR> Means of arrival:<BR> Comments:<BR> wkness

## 2012-12-13 NOTE — ED Notes (Signed)
Per EMS pts caregiver reports pt has had decreased intake of fluids and a total of 500cc of dark urine output between yesterday and today.

## 2012-12-14 ENCOUNTER — Encounter: Payer: Self-pay | Admitting: *Deleted

## 2012-12-19 ENCOUNTER — Telehealth: Payer: Self-pay | Admitting: Family Medicine

## 2012-12-19 DIAGNOSIS — J449 Chronic obstructive pulmonary disease, unspecified: Secondary | ICD-10-CM

## 2012-12-19 NOTE — Telephone Encounter (Signed)
Dr Juanetta Gosling in [place] is very good

## 2012-12-19 NOTE — Telephone Encounter (Signed)
Spoke with [name] and they would like to be referred to a different pulmonologist.  They were not happy with his evaluation.  Told her I would check and see if there are any other providers that will take him.  Maybe Dr. Maple Hudson?

## 2012-12-19 NOTE — Telephone Encounter (Signed)
Spoke with [name] to notify of referral

## 2012-12-20 ENCOUNTER — Ambulatory Visit: Payer: Medicare Other | Admitting: Internal Medicine

## 2013-01-09 ENCOUNTER — Telehealth: Payer: Self-pay | Admitting: Family Medicine

## 2013-01-09 NOTE — Telephone Encounter (Signed)
[name] would like to know what options for in home care Terry Flynn qualifies for.    He needs help with ADLs and medication administration.  His short term memory is very bad and he has trouble getting around on his own.

## 2013-01-10 NOTE — Telephone Encounter (Signed)
Gave son a number to [facility], we will start there.

## 2013-01-12 ENCOUNTER — Inpatient Hospital Stay (HOSPITAL_COMMUNITY)
Admission: EM | Admit: 2013-01-12 | Discharge: 2013-01-16 | DRG: 189 | Disposition: A | Discharge status: Skilled Nursing Facility | Payer: Medicare Other | Attending: Internal Medicine | Admitting: Internal Medicine

## 2013-01-12 ENCOUNTER — Emergency Department (HOSPITAL_COMMUNITY): Payer: Medicare Other

## 2013-01-12 ENCOUNTER — Encounter (HOSPITAL_COMMUNITY): Payer: Self-pay | Admitting: Emergency Medicine

## 2013-01-12 DIAGNOSIS — N183 Chronic kidney disease, stage 3 unspecified: Secondary | ICD-10-CM

## 2013-01-12 DIAGNOSIS — R0600 Dyspnea, unspecified: Secondary | ICD-10-CM

## 2013-01-12 DIAGNOSIS — I7090 Unspecified atherosclerosis: Secondary | ICD-10-CM

## 2013-01-12 DIAGNOSIS — Z66 Do not resuscitate: Secondary | ICD-10-CM | POA: Diagnosis present

## 2013-01-12 DIAGNOSIS — I2510 Atherosclerotic heart disease of native coronary artery without angina pectoris: Secondary | ICD-10-CM

## 2013-01-12 DIAGNOSIS — R0902 Hypoxemia: Secondary | ICD-10-CM | POA: Diagnosis present

## 2013-01-12 DIAGNOSIS — I10 Essential (primary) hypertension: Secondary | ICD-10-CM

## 2013-01-12 DIAGNOSIS — N179 Acute kidney failure, unspecified: Secondary | ICD-10-CM | POA: Diagnosis present

## 2013-01-12 DIAGNOSIS — J9600 Acute respiratory failure, unspecified whether with hypoxia or hypercapnia: Principal | ICD-10-CM

## 2013-01-12 DIAGNOSIS — I129 Hypertensive chronic kidney disease with stage 1 through stage 4 chronic kidney disease, or unspecified chronic kidney disease: Secondary | ICD-10-CM | POA: Diagnosis present

## 2013-01-12 DIAGNOSIS — R0602 Shortness of breath: Secondary | ICD-10-CM | POA: Diagnosis present

## 2013-01-12 DIAGNOSIS — I639 Cerebral infarction, unspecified: Secondary | ICD-10-CM | POA: Diagnosis present

## 2013-01-12 DIAGNOSIS — F17200 Nicotine dependence, unspecified, uncomplicated: Secondary | ICD-10-CM | POA: Diagnosis present

## 2013-01-12 DIAGNOSIS — Z8673 Personal history of transient ischemic attack (TIA), and cerebral infarction without residual deficits: Secondary | ICD-10-CM

## 2013-01-12 DIAGNOSIS — F0390 Unspecified dementia without behavioral disturbance: Secondary | ICD-10-CM | POA: Diagnosis present

## 2013-01-12 DIAGNOSIS — J441 Chronic obstructive pulmonary disease with (acute) exacerbation: Secondary | ICD-10-CM | POA: Diagnosis present

## 2013-01-12 DIAGNOSIS — Z87891 Personal history of nicotine dependence: Secondary | ICD-10-CM

## 2013-01-12 DIAGNOSIS — J9601 Acute respiratory failure with hypoxia: Secondary | ICD-10-CM | POA: Diagnosis present

## 2013-01-12 DIAGNOSIS — D649 Anemia, unspecified: Secondary | ICD-10-CM

## 2013-01-12 LAB — CBC WITH DIFFERENTIAL/PLATELET
Basophils Absolute: 0 10*3/uL (ref 0.0–0.1)
Basophils Relative: 0 % (ref 0–1)
Eosinophils Absolute: 0.2 10*3/uL (ref 0.0–0.7)
HCT: 40.2 % (ref 39.0–52.0)
MCH: 27.7 pg (ref 26.0–34.0)
MCV: 88.9 fL (ref 78.0–100.0)
Monocytes Relative: 9 % (ref 3–12)
Neutrophils Relative %: 66 % (ref 43–77)
Platelets: 247 10*3/uL (ref 150–400)
RBC: 4.52 MIL/uL (ref 4.22–5.81)

## 2013-01-12 LAB — COMPREHENSIVE METABOLIC PANEL
ALT: 9 U/L (ref 0–53)
AST: 10 U/L (ref 0–37)
Albumin: 3.2 g/dL — ABNORMAL LOW (ref 3.5–5.2)
Alkaline Phosphatase: 98 U/L (ref 39–117)
BUN: 27 mg/dL — ABNORMAL HIGH (ref 6–23)
Chloride: 103 mEq/L (ref 96–112)
Potassium: 4.2 mEq/L (ref 3.5–5.1)
Sodium: 140 mEq/L (ref 135–145)
Total Bilirubin: 0.2 mg/dL — ABNORMAL LOW (ref 0.3–1.2)
Total Protein: 7.4 g/dL (ref 6.0–8.3)

## 2013-01-12 LAB — POCT I-STAT TROPONIN I

## 2013-01-12 LAB — URINALYSIS, ROUTINE W REFLEX MICROSCOPIC
Bilirubin Urine: NEGATIVE
Glucose, UA: NEGATIVE mg/dL
Hgb urine dipstick: NEGATIVE
Ketones, ur: NEGATIVE mg/dL
Leukocytes, UA: NEGATIVE
Protein, ur: NEGATIVE mg/dL
pH: 6 (ref 5.0–8.0)

## 2013-01-12 LAB — D-DIMER, QUANTITATIVE: D-Dimer, Quant: 0.97 ug/mL-FEU — ABNORMAL HIGH (ref 0.00–0.48)

## 2013-01-12 MED ORDER — PAROXETINE HCL 20 MG PO TABS
40.0000 mg | ORAL_TABLET | Freq: Every day | ORAL | Status: DC
  Administered 2013-01-13 – 2013-01-16 (×4): 40 mg via ORAL
  Filled 2013-01-12 (×4): qty 2

## 2013-01-12 MED ORDER — TAMSULOSIN HCL 0.4 MG PO CAPS
0.4000 mg | ORAL_CAPSULE | Freq: Every day | ORAL | Status: DC
  Administered 2013-01-13 – 2013-01-16 (×4): 0.4 mg via ORAL
  Filled 2013-01-12 (×4): qty 1

## 2013-01-12 MED ORDER — SODIUM CHLORIDE 0.9 % IJ SOLN
3.0000 mL | Freq: Two times a day (BID) | INTRAMUSCULAR | Status: DC
  Administered 2013-01-12 – 2013-01-13 (×3): 3 mL via INTRAVENOUS
  Administered 2013-01-14: 22:00:00 via INTRAVENOUS
  Administered 2013-01-14 – 2013-01-15 (×3): 3 mL via INTRAVENOUS

## 2013-01-12 MED ORDER — SODIUM CHLORIDE 0.9 % IV SOLN: 250.0000 mL | INTRAVENOUS | Status: DC | PRN

## 2013-01-12 MED ORDER — ALBUTEROL SULFATE (5 MG/ML) 0.5% IN NEBU
5.0000 mg | INHALATION_SOLUTION | Freq: Once | RESPIRATORY_TRACT | Status: AC
  Administered 2013-01-12: 5 mg via RESPIRATORY_TRACT
  Filled 2013-01-12: qty 1

## 2013-01-12 MED ORDER — CLOPIDOGREL BISULFATE 75 MG PO TABS
75.0000 mg | ORAL_TABLET | Freq: Every day | ORAL | Status: DC
  Administered 2013-01-13 – 2013-01-16 (×4): 75 mg via ORAL
  Filled 2013-01-12 (×6): qty 1

## 2013-01-12 MED ORDER — AZITHROMYCIN 500 MG PO TABS
500.0000 mg | ORAL_TABLET | Freq: Every day | ORAL | Status: AC
  Administered 2013-01-12: 500 mg via ORAL
  Filled 2013-01-12 (×2): qty 1

## 2013-01-12 MED ORDER — MEMANTINE HCL ER 28 MG PO CP24: 28.0000 mg | ORAL_CAPSULE | Freq: Every evening | ORAL | Status: DC

## 2013-01-12 MED ORDER — SENNOSIDES-DOCUSATE SODIUM 8.6-50 MG PO TABS
1.0000 | ORAL_TABLET | Freq: Every evening | ORAL | Status: DC | PRN
  Filled 2013-01-12: qty 1

## 2013-01-12 MED ORDER — RISPERIDONE 0.5 MG PO TABS
0.5000 mg | ORAL_TABLET | Freq: Every day | ORAL | Status: DC
  Administered 2013-01-12 – 2013-01-16 (×5): 0.5 mg via ORAL
  Filled 2013-01-12 (×5): qty 1

## 2013-01-12 MED ORDER — AZITHROMYCIN 250 MG PO TABS
250.0000 mg | ORAL_TABLET | Freq: Every day | ORAL | Status: DC
  Administered 2013-01-13 – 2013-01-14 (×2): 250 mg via ORAL
  Filled 2013-01-12 (×2): qty 1

## 2013-01-12 MED ORDER — PAROXETINE HCL 20 MG PO TABS: 40.0000 mg | ORAL_TABLET | ORAL | Status: DC

## 2013-01-12 MED ORDER — OXYCODONE HCL 5 MG PO TABS
5.0000 mg | ORAL_TABLET | ORAL | Status: DC | PRN
  Filled 2013-01-12: qty 1

## 2013-01-12 MED ORDER — FUROSEMIDE 10 MG/ML IJ SOLN
40.0000 mg | Freq: Two times a day (BID) | INTRAMUSCULAR | Status: DC
  Administered 2013-01-12 – 2013-01-14 (×4): 40 mg via INTRAVENOUS
  Filled 2013-01-12 (×6): qty 4

## 2013-01-12 MED ORDER — IPRATROPIUM-ALBUTEROL 20-100 MCG/ACT IN AERS
2.0000 | INHALATION_SPRAY | Freq: Three times a day (TID) | RESPIRATORY_TRACT | Status: DC
  Administered 2013-01-12 – 2013-01-16 (×10): 2 via RESPIRATORY_TRACT
  Filled 2013-01-12 (×2): qty 4

## 2013-01-12 MED ORDER — IPRATROPIUM BROMIDE 0.02 % IN SOLN
0.5000 mg | Freq: Once | RESPIRATORY_TRACT | Status: AC
  Administered 2013-01-12: 0.5 mg via RESPIRATORY_TRACT
  Filled 2013-01-12: qty 2.5

## 2013-01-12 MED ORDER — ENOXAPARIN SODIUM 40 MG/0.4ML ~~LOC~~ SOLN
40.0000 mg | SUBCUTANEOUS | Status: DC
  Administered 2013-01-12 – 2013-01-13 (×2): 40 mg via SUBCUTANEOUS
  Filled 2013-01-12 (×3): qty 0.4

## 2013-01-12 MED ORDER — ALBUTEROL SULFATE (5 MG/ML) 0.5% IN NEBU
INHALATION_SOLUTION | RESPIRATORY_TRACT | Status: AC
  Administered 2013-01-12: 18:00:00
  Filled 2013-01-12: qty 1

## 2013-01-12 MED ORDER — IPRATROPIUM BROMIDE 0.02 % IN SOLN
RESPIRATORY_TRACT | Status: AC
  Filled 2013-01-12: qty 2.5

## 2013-01-12 MED ORDER — RIVASTIGMINE 4.6 MG/24HR TD PT24
4.6000 mg | MEDICATED_PATCH | Freq: Every day | TRANSDERMAL | Status: DC
  Administered 2013-01-13 – 2013-01-16 (×4): 4.6 mg via TRANSDERMAL
  Filled 2013-01-12 (×4): qty 1

## 2013-01-12 MED ORDER — AMLODIPINE BESYLATE 10 MG PO TABS
10.0000 mg | ORAL_TABLET | Freq: Every day | ORAL | Status: DC
  Administered 2013-01-13 – 2013-01-16 (×4): 10 mg via ORAL
  Filled 2013-01-12 (×4): qty 1

## 2013-01-12 MED ORDER — CLONAZEPAM 0.5 MG PO TABS
0.5000 mg | ORAL_TABLET | Freq: Two times a day (BID) | ORAL | Status: DC | PRN
  Administered 2013-01-12 – 2013-01-16 (×6): 0.5 mg via ORAL
  Filled 2013-01-12 (×7): qty 1

## 2013-01-12 MED ORDER — TRAZODONE HCL 50 MG PO TABS
50.0000 mg | ORAL_TABLET | Freq: Every day | ORAL | Status: DC
  Administered 2013-01-12 – 2013-01-15 (×4): 50 mg via ORAL
  Filled 2013-01-12 (×5): qty 1

## 2013-01-12 MED ORDER — FEBUXOSTAT 80 MG PO TABS
80.0000 mg | ORAL_TABLET | Freq: Every day | ORAL | Status: DC
  Administered 2013-01-13 – 2013-01-16 (×4): 80 mg via ORAL
  Filled 2013-01-12 (×4): qty 1

## 2013-01-12 MED ORDER — METHYLPREDNISOLONE SODIUM SUCC 125 MG IJ SOLR
125.0000 mg | Freq: Once | INTRAMUSCULAR | Status: AC
  Administered 2013-01-12: 125 mg via INTRAVENOUS
  Filled 2013-01-12: qty 2

## 2013-01-12 MED ORDER — ONDANSETRON HCL 4 MG/2ML IJ SOLN: 4.0000 mg | Freq: Four times a day (QID) | INTRAMUSCULAR | Status: DC | PRN

## 2013-01-12 MED ORDER — ACETAMINOPHEN 650 MG RE SUPP: 650.0000 mg | Freq: Four times a day (QID) | RECTAL | Status: DC | PRN

## 2013-01-12 MED ORDER — ACETAMINOPHEN 325 MG PO TABS: 650.0000 mg | ORAL_TABLET | Freq: Four times a day (QID) | ORAL | Status: DC | PRN

## 2013-01-12 MED ORDER — ONDANSETRON HCL 4 MG PO TABS: 4.0000 mg | ORAL_TABLET | Freq: Four times a day (QID) | ORAL | Status: DC | PRN

## 2013-01-12 MED ORDER — SODIUM CHLORIDE 0.9 % IJ SOLN: 3.0000 mL | INTRAMUSCULAR | Status: DC | PRN

## 2013-01-12 NOTE — ED Notes (Signed)
MD at bedside. Dr. Allen at bedside.  

## 2013-01-12 NOTE — ED Notes (Signed)
[id number]> Expected date:<BR> Expected time:<BR> Means of arrival:<BR> Comments:<BR> Ems/ sob

## 2013-01-12 NOTE — ED Provider Notes (Signed)
History     CSN: [id number]  Arrival date & time 01/12/13  10-Feb-1320   First MD Initiated Contact with Patient 01/12/13 1505      Chief Complaint  Patient presents with  . Shortness of Breath    (Consider location/radiation/quality/duration/timing/severity/associated sxs/prior treatment) Patient is a 79 y.o. male presenting with shortness of breath. The history is provided by the patient.  Shortness of Breath Severity:  Moderate Onset quality:  Gradual Duration:  1 week Timing:  Constant Chronicity:  Recurrent Relieved by:  Nothing Worsened by:  Nothing tried Ineffective treatments:  None tried Associated symptoms: cough and wheezing   Associated symptoms: no chest pain, no diaphoresis, no fever and no vomiting    H/o copd--ems called and pt transported here Past Medical History  Diagnosis Date  . Back pain     DJD  . Hypertension   . Arteriosclerotic cardiovascular disease (ASCVD)     stent to RCA Feb 10, 1996 and 02-10-03  . COPD (chronic obstructive pulmonary disease)   . GERD (gastroesophageal reflux disease)   . Headache     chronic  . Chronic renal insufficiency     baseline Creatinine-1.4  . Anxiety and depression 1996    Chronic since wife's death in Feb 10, 1995  . Cognitive decline 02/09/10    progressive since Feb 09, 2010, question of dementia  . Hyperlipidemia   . Cholelithiasis     Incidentally noted  . Heart attack     x2  . Stroke July 2013    Past Surgical History  Procedure Laterality Date  . Coronary stent placement  1997    RCA  . Coronary stent placement  02/10/03    in-stent restenosis, stent replaced RCA.    . Colonoscopy  10/11/2012    Procedure: COLONOSCOPY;  Surgeon: Malissa Hippo, MD;  Location: [facility];  Service: Endoscopy;  Laterality: N/A;  100    Family History  Problem Relation Age of Onset  . Throat cancer Father     Smoker  . Heart disease Brother   . Asthma Son   . Heart disease Brother     History  Substance Use Topics  . Smoking status:  Former Smoker -- 1.00 packs/day for 50 years    Types: Cigarettes    Quit date: 03/24/2012  . Smokeless tobacco: Not on file  . Alcohol Use: No      Review of Systems  Constitutional: Negative for fever and diaphoresis.  Respiratory: Positive for cough, shortness of breath and wheezing.   Cardiovascular: Negative for chest pain.  Gastrointestinal: Negative for vomiting.  All other systems reviewed and are negative.    Allergies  Statins  Home Medications   Current Outpatient Rx  Name  Route  Sig  Dispense  Refill  . amLODipine (NORVASC) 10 MG tablet   Oral   Take 10 mg by mouth daily.           . clonazePAM (KLONOPIN) 0.5 MG tablet   Oral   Take 0.5 mg by mouth 2 (two) times daily as needed for anxiety.         . clopidogrel (PLAVIX) 75 MG tablet   Oral   Take 75 mg by mouth daily.         . Febuxostat (ULORIC) 80 MG TABS   Oral   Take 80 mg by mouth daily.          . Ipratropium-Albuterol (COMBIVENT) 20-100 MCG/ACT AERS respimat   Inhalation   Inhale 2 puffs into  the lungs 3 (three) times daily.         . Memantine HCl ER (NAMENDA XR) 28 MG CP24   Oral   Take 1 tablet by mouth every evening.         Marland Kitchen PARoxetine (PAXIL) 40 MG tablet   Oral   Take 40 mg by mouth every morning.         . risperiDONE (RISPERDAL) 0.5 MG tablet   Oral   Take 1 tablet (0.5 mg total) by mouth daily.   30 tablet   0   . rivastigmine (EXELON) 4.6 mg/24hr   Transdermal   Place 1 patch onto the skin daily.         . Tamsulosin HCl (FLOMAX) 0.4 MG CAPS   Oral   Take 1 capsule (0.4 mg total) by mouth daily.   30 capsule      . traZODone (DESYREL) 50 MG tablet   Oral   Take 50 mg by mouth at bedtime.         . predniSONE (DELTASONE) 50 MG tablet   Oral   Take 1 tablet (50 mg total) by mouth daily.   10 tablet   0     BP 116/63  Pulse 76  Temp(Src) 97.4 F (36.3 C) (Oral)  Resp 15  SpO2 93%  Physical Exam  Nursing note and vitals  reviewed. Constitutional: He is oriented to person, place, and time. He appears well-developed and well-nourished.  Non-toxic appearance. No distress.  HENT:  Head: Normocephalic and atraumatic.  Eyes: Conjunctivae, EOM and lids are normal. Pupils are equal, round, and reactive to light.  Neck: Normal range of motion. Neck supple. No tracheal deviation present. No mass present.  Cardiovascular: Normal rate, regular rhythm and normal heart sounds.  Exam reveals no gallop.   No murmur heard. Pulmonary/Chest: Effort normal. No stridor. No respiratory distress. He has decreased breath sounds. He has wheezes. He has rhonchi. He has no rales.  Abdominal: Soft. Normal appearance and bowel sounds are normal. He exhibits no distension. There is no tenderness. There is no rebound and no CVA tenderness.  Musculoskeletal: Normal range of motion. He exhibits no edema and no tenderness.  3 plus pitting edema  Neurological: He is alert and oriented to person, place, and time. He has normal strength. No cranial nerve deficit or sensory deficit. GCS eye subscore is 4. GCS verbal subscore is 5. GCS motor subscore is 6.  Skin: Skin is warm and dry. No abrasion and no rash noted.  Psychiatric: His speech is normal. His affect is blunt. He is slowed.    ED Course  Procedures (including critical care time)  Labs Reviewed  CBC WITH DIFFERENTIAL  COMPREHENSIVE METABOLIC PANEL  URINALYSIS, ROUTINE W REFLEX MICROSCOPIC  PRO B NATRIURETIC PEPTIDE   No results found.   No diagnosis found.    MDM   Date: 01/12/2013  Rate: 70  Rhythm: normal sinus rhythm  QRS Axis: normal  Intervals: normal  ST/T Wave abnormalities: nonspecific st changes  Conduction Disutrbances:none  Narrative Interpretation:   Old EKG Reviewed: none available   7:53 PM Pt given solumedrol and bronchodilators, will be admitted by [facility]--pt unable to have v/q scan due tomlack of radioactive material       Toy Baker,  MD 01/12/13 1956

## 2013-01-12 NOTE — H&P (Signed)
[facility]          History and Physical    PCP:   [name], MD   Chief Complaint:  Shortness of breath  HPI: Patient is a 79 year old man with past medical history significant for hypertension, dementia, tobacco abuse,  history of stroke, chronic kidney disease, mild COPD who presents to the hospital today with shortness of breath. He is a very poor historian on account of his dementia. His daughter-in-law who is also his caregiver is at bedside. She states that about 3 days ago he started complaining of shortness of breath. She also noticed that his legs had become very swollen and he seemed to be gurgling. They decided to bring him into the hospital today where I was told in report that he had an initial oxygen saturation of 80% on room air. However this is not documented in his chart. The lowest that I can see is 93% on room air. His chest x-ray showed some pulmonary vascular congestion, had an elevated d-dimer however CT scan was not obtained given chronic kidney disease. We have been asked to admit him for further evaluation and management.  Allergies:   Allergies  Allergen Reactions  . Statins Other (See Comments)    REACTION: MYALGIA      Past Medical History  Diagnosis Date  . Back pain     DJD  . Hypertension   . Arteriosclerotic cardiovascular disease (ASCVD)     stent to RCA February 25, 1996 and 2003/02/25  . COPD (chronic obstructive pulmonary disease)   . GERD (gastroesophageal reflux disease)   . Headache     chronic  . Chronic renal insufficiency     baseline Creatinine-1.4  . Anxiety and depression 1996    Chronic since wife's death in 02-25-1995  . Cognitive decline 02-24-10    progressive since 24-Feb-2010, question of dementia  . Hyperlipidemia   . Cholelithiasis     Incidentally noted  . Heart attack     x2  . Stroke July 2013    Past Surgical History  Procedure Laterality Date  . Coronary stent placement  1997    RCA  . Coronary stent placement  02/25/2003   in-stent restenosis, stent replaced RCA.    . Colonoscopy  10/11/2012    Procedure: COLONOSCOPY;  Surgeon: Malissa Hippo, MD;  Location: [facility];  Service: Endoscopy;  Laterality: N/A;  100    Prior to Admission medications   Medication Sig Start Date End Date Taking? Authorizing Provider  amLODipine (NORVASC) 10 MG tablet Take 10 mg by mouth daily.     Yes Historical Provider, MD  clonazePAM (KLONOPIN) 0.5 MG tablet Take 0.5 mg by mouth 2 (two) times daily as needed for anxiety.   Yes Historical Provider, MD  clopidogrel (PLAVIX) 75 MG tablet Take 75 mg by mouth daily.   Yes Historical Provider, MD  Febuxostat (ULORIC) 80 MG TABS Take 80 mg by mouth daily.    Yes Historical Provider, MD  Ipratropium-Albuterol (COMBIVENT) 20-100 MCG/ACT AERS respimat Inhale 2 puffs into the lungs 3 (three) times daily. 05/16/12  Yes Kathlen Brunswick, MD  Memantine HCl ER (NAMENDA XR) 28 MG CP24 Take 1 tablet by mouth every evening.   Yes Historical Provider, MD  PARoxetine (PAXIL) 40 MG tablet Take 40 mg by mouth every morning.   Yes Historical Provider, MD  risperiDONE (RISPERDAL) 0.5 MG tablet Take 1 tablet (0.5 mg total) by mouth daily. 08/29/12  Yes Sarina Ill  Lama, MD  rivastigmine (EXELON) 4.6 mg/24hr Place 1 patch onto the skin daily.   Yes Historical Provider, MD  Tamsulosin HCl (FLOMAX) 0.4 MG CAPS Take 1 capsule (0.4 mg total) by mouth daily. 03/28/12  Yes Christina P Rama, MD  traZODone (DESYREL) 50 MG tablet Take 50 mg by mouth at bedtime.   Yes Historical Provider, MD  predniSONE (DELTASONE) 50 MG tablet Take 1 tablet (50 mg total) by mouth daily. 12/13/12   Ethelda Chick, MD    Social History:  reports that he quit smoking about 9 months ago. His smoking use included Cigarettes. He has a 50 pack-year smoking history. He has never used smokeless tobacco. He reports that he does not drink alcohol or use illicit drugs.  Family History  Problem Relation Age of Onset  . Throat cancer Father      Smoker  . Heart disease Brother   . Asthma Son   . Heart disease Brother     Review of Systems:  Constitutional: Denies fever, chills, diaphoresis, appetite change and fatigue.  HEENT: Denies photophobia, eye pain, redness, hearing loss, ear pain, congestion, sore throat, rhinorrhea, sneezing, mouth sores, trouble swallowing, neck pain, neck stiffness and tinnitus.   Respiratory: Denies SOB, DOE, cough, chest tightness,  and wheezing.   Cardiovascular: Denies chest pain, palpitations..  Gastrointestinal: Denies nausea, vomiting, abdominal pain, diarrhea, constipation, blood in stool and abdominal distention.  Genitourinary: Denies dysuria, urgency, frequency, hematuria, flank pain and difficulty urinating.  Musculoskeletal: Denies myalgias, back pain, joint swelling, arthralgias and gait problem.  Skin: Denies pallor, rash and wound.  Neurological: Denies dizziness, seizures, syncope, weakness, light-headedness, numbness and headaches.  Hematological: Denies adenopathy. Easy bruising, personal or family bleeding history  Psychiatric/Behavioral: Denies suicidal ideation, mood changes, confusion, nervousness, sleep disturbance and agitation   Physical Exam: Blood pressure 121/66, pulse 82, temperature 98.6 F (37 C), temperature source Axillary, resp. rate 16, height 5\' 6"  (1.676 m), weight 99.1 kg (218 lb 7.6 oz), SpO2 94.00%. General: Awake, oriented x2, not to time. HEENT: Normocephalic, atraumatic, pupils equal round and reactive to light, extraocular movements intact, moist mucous membranes. Neck: Supple, no JVD, no lymphadenopathy, no bruits, no goiter. Cardiovascular: Regular rate and rhythm, no murmurs, rubs or gallops. Lungs: Diffuse rhonchi bilaterally, no wheezes. Abdomen: Soft, nontender, nondistended, positive bowel sounds, no masses or organomegaly noted. Extremities: 3+ edema bilaterally much greater on the left compared to the right. Neurologic: Nonfocal.  Labs on  Admission:  Results for orders placed during the hospital encounter of 01/12/13 (from the past 48 hour(s))  CBC WITH DIFFERENTIAL     Status: Abnormal   Collection Time    01/12/13  3:30 PM      Result Value Range   WBC 6.4  4.0 - 10.5 K/uL   RBC 4.52  4.22 - 5.81 MIL/uL   Hemoglobin 12.5 (*) 13.0 - 17.0 g/dL   HCT 40.9  81.1 - 91.4 %   MCV 88.9  78.0 - 100.0 fL   MCH 27.7  26.0 - 34.0 pg   MCHC 31.1  30.0 - 36.0 g/dL   RDW 78.2  95.6 - 21.3 %   Platelets 247  150 - 400 K/uL   Neutrophils Relative 66  43 - 77 %   Neutro Abs 4.3  1.7 - 7.7 K/uL   Lymphocytes Relative 22  12 - 46 %   Lymphs Abs 1.4  0.7 - 4.0 K/uL   Monocytes Relative 9  3 - 12 %  Monocytes Absolute 0.6  0.1 - 1.0 K/uL   Eosinophils Relative 3  0 - 5 %   Eosinophils Absolute 0.2  0.0 - 0.7 K/uL   Basophils Relative 0  0 - 1 %   Basophils Absolute 0.0  0.0 - 0.1 K/uL  COMPREHENSIVE METABOLIC PANEL     Status: Abnormal   Collection Time    01/12/13  3:30 PM      Result Value Range   Sodium 140  135 - 145 mEq/L   Potassium 4.2  3.5 - 5.1 mEq/L   Chloride 103  96 - 112 mEq/L   CO2 28  19 - 32 mEq/L   Glucose, Bld 108 (*) 70 - 99 mg/dL   BUN 27 (*) 6 - 23 mg/dL   Creatinine, Ser 7.82 (*) 0.50 - 1.35 mg/dL   Calcium 9.5  8.4 - 95.6 mg/dL   Total Protein 7.4  6.0 - 8.3 g/dL   Albumin 3.2 (*) 3.5 - 5.2 g/dL   AST 10  0 - 37 U/L   ALT 9  0 - 53 U/L   Alkaline Phosphatase 98  39 - 117 U/L   Total Bilirubin 0.2 (*) 0.3 - 1.2 mg/dL   GFR calc non Af Amer 34 (*) >90 mL/min   GFR calc Af Amer 40 (*) >90 mL/min   Comment:            The eGFR has been calculated     using the CKD EPI equation.     This calculation has not been     validated in all clinical     situations.     eGFR's persistently     <90 mL/min signify     possible Chronic Kidney Disease.  PRO B NATRIURETIC PEPTIDE     Status: None   Collection Time    01/12/13  3:30 PM      Result Value Range   Pro B Natriuretic peptide (BNP) 102.4  0 - 450  pg/mL  POCT I-STAT TROPONIN I     Status: None   Collection Time    01/12/13  3:36 PM      Result Value Range   Troponin i, poc 0.01  0.00 - 0.08 ng/mL   Comment 3            Comment: Due to the release kinetics of cTnI,     a negative result within the first hours     of the onset of symptoms does not rule out     myocardial infarction with certainty.     If myocardial infarction is still suspected,     repeat the test at appropriate intervals.  URINALYSIS, ROUTINE W REFLEX MICROSCOPIC     Status: None   Collection Time    01/12/13  4:24 PM      Result Value Range   Color, Urine YELLOW  YELLOW   APPearance CLEAR  CLEAR   Specific Gravity, Urine 1.018  1.005 - 1.030   pH 6.0  5.0 - 8.0   Glucose, UA NEGATIVE  NEGATIVE mg/dL   Hgb urine dipstick NEGATIVE  NEGATIVE   Bilirubin Urine NEGATIVE  NEGATIVE   Ketones, ur NEGATIVE  NEGATIVE mg/dL   Protein, ur NEGATIVE  NEGATIVE mg/dL   Urobilinogen, UA 0.2  0.0 - 1.0 mg/dL   Nitrite NEGATIVE  NEGATIVE   Leukocytes, UA NEGATIVE  NEGATIVE   Comment: MICROSCOPIC NOT DONE ON URINES WITH NEGATIVE PROTEIN, BLOOD, LEUKOCYTES, NITRITE, OR GLUCOSE <  1000 mg/dL.  D-DIMER, QUANTITATIVE     Status: Abnormal   Collection Time    01/12/13  4:30 PM      Result Value Range   D-Dimer, Quant 0.97 (*) 0.00 - 0.48 ug/mL-FEU   Comment:            AT THE INHOUSE ESTABLISHED CUTOFF     VALUE OF 0.48 ug/mL FEU,     THIS ASSAY HAS BEEN DOCUMENTED     IN THE LITERATURE TO HAVE     A SENSITIVITY AND NEGATIVE     PREDICTIVE VALUE OF AT LEAST     98 TO 99%.  THE TEST RESULT     SHOULD BE CORRELATED WITH     AN ASSESSMENT OF THE CLINICAL     PROBABILITY OF DVT / VTE.    Radiological Exams on Admission: Dg Chest 2 View  01/12/2013  *RADIOLOGY REPORT*  Clinical Data: Shortness of breath  CHEST - 2 VIEW  Comparison: December 13, 2012.  Findings: Stable mild cardiomegaly.  Mild central pulmonary vascular congestion is again noted and unchanged.  No pneumonia or  atelectasis is noted.  Bony thorax is intact.  IMPRESSION: Stable cardiomegaly and central pulmonary vascular congestion.  No significant change compared to prior exam.   Original Report Authenticated By: Lupita Raider.,  M.D.     Assessment/Plan Principal Problem:   SOB (shortness of breath) Active Problems:   TOBACCO ABUSE   Hypertension   CVA (cerebral infarction)   Arteriosclerotic cardiovascular disease (ASCVD)   CKD (chronic kidney disease) stage 3, GFR 30-59 ml/min   Acute respiratory failure with hypoxia   Dementia   Acute hypoxemic respiratory failure/shortness of breath -Etiology not entirely clear at this point. -He does have some mild COPD, however is not actively wheezing. -Chest x-ray does not show evidence for pneumonia. -Chest x-ray does have some vascular congestion that could be indicative of edema, this in addition to his lower extremity edema makes me suspect that he may have some degree of congestive heart failure. Strict intake and output, daily weights, we'll start him on Lasix 40 twice a day I will check a 2-D echo. -He does have an elevated d-dimer, VQ scan has been ordered for the morning. If VQ scan is negative I would order a left lower extremity venous Doppler to fully rule out a DVT.  Chronic kidney disease stage III -At baseline.  Dementia -Continue home medications.  Hypertension -Currently well-controlled. -Continue home medications.  DVT prophylaxis -Lovenox.  Time Spent on Admission:  80 minutes.  Chaya Jan [facility] Pager: [phone] 01/12/2013, 9:14 PM

## 2013-01-13 ENCOUNTER — Inpatient Hospital Stay (HOSPITAL_COMMUNITY): Payer: Medicare Other

## 2013-01-13 DIAGNOSIS — R0989 Other specified symptoms and signs involving the circulatory and respiratory systems: Secondary | ICD-10-CM

## 2013-01-13 DIAGNOSIS — D649 Anemia, unspecified: Secondary | ICD-10-CM

## 2013-01-13 DIAGNOSIS — I369 Nonrheumatic tricuspid valve disorder, unspecified: Secondary | ICD-10-CM

## 2013-01-13 DIAGNOSIS — R0609 Other forms of dyspnea: Secondary | ICD-10-CM

## 2013-01-13 LAB — CBC
HCT: 37.6 % — ABNORMAL LOW (ref 39.0–52.0)
Hemoglobin: 11.9 g/dL — ABNORMAL LOW (ref 13.0–17.0)
MCH: 27.7 pg (ref 26.0–34.0)
MCV: 87.6 fL (ref 78.0–100.0)
Platelets: 255 10*3/uL (ref 150–400)
RBC: 4.29 MIL/uL (ref 4.22–5.81)
WBC: 7.3 10*3/uL (ref 4.0–10.5)

## 2013-01-13 LAB — BASIC METABOLIC PANEL
CO2: 26 mEq/L (ref 19–32)
Chloride: 101 mEq/L (ref 96–112)
Glucose, Bld: 158 mg/dL — ABNORMAL HIGH (ref 70–99)
Potassium: 4.6 mEq/L (ref 3.5–5.1)
Sodium: 138 mEq/L (ref 135–145)

## 2013-01-13 MED ORDER — TECHNETIUM TO 99M ALBUMIN AGGREGATED
5.1000 | Freq: Once | INTRAVENOUS | Status: AC | PRN
  Administered 2013-01-13: 5 via INTRAVENOUS

## 2013-01-13 MED ORDER — TECHNETIUM TC 99M DIETHYLENETRIAME-PENTAACETIC ACID
46.5000 | Freq: Once | INTRAVENOUS | Status: AC | PRN
  Administered 2013-01-13: 46.5 via INTRAVENOUS

## 2013-01-13 NOTE — Progress Notes (Signed)
Patient ID: Terry Tappan Dermody Sr., male   DOB: 07-Aug-1933, 79 y.o.   MRN: [id number] [facility] PROGRESS NOTE  [name] WUJ:[id number] DOB: August 25, 1933 DOA: 01/12/2013 PCP: [name], MD  Brief narrative: Patient is a 79 year old man with past medical history significant for hypertension, dementia, tobacco abuse, history of stroke, chronic kidney disease, mild COPD who presents to the hospital with progressively worsening shortness of breath that was initially noted 3-4 days prior to admission by family member. This was associated with lower extremity swelling and gargling noises with breathing. In ED, pt had oxygen saturations in low 80% and findings on exam and CXR worrisome for vascular congestion. [facility] asked to admit for further evaluation.   Principal Problem:   Acute hypoxic respiratory distress  - etiology unclear and appears to be related to possible diastolic CHF  - 2 D ECHO with normal systolic function, BNP within normal limits on admission - no evidence of an infectious etiology on exam and VQ scan negative for PE - will plan on CT chest without contrast for clearer evaluation - continue Lasix and empiric ABX for now - strict I's and O's, daily weights  Active Problems:   Hypertension - reasonable inpatient control    Arteriosclerotic cardiovascular disease (ASCVD) - continue Plavix and BP control    CKD (chronic kidney disease) stage 3, GFR 30-59 ml/min - creatinine appears to be at pt's baseline and continued to trend down - BMP in AM   Dementia - at baseline    Hyperglycemia - will check A1C  Consultants:  None  Procedures/Studies: Dg Chest 2 View 01/12/2013   Stable cardiomegaly and central pulmonary vascular congestion.  No significant change compared to prior exam.    Nm Pulmonary Perf And Vent 01/13/2013  Pulmonary embolism absent (very low probability for pulmonary embolism).    Antibiotics:  Zithromax 04/25 -->  Code Status: DNR Family  Communication: Pt at bedside Disposition Plan: Home when medically stable  HPI/Subjective: No events overnight.   Objective: Filed Vitals:   01/12/13 2108 01/13/13 0524 01/13/13 0750 01/13/13 1443  BP: 121/66 111/48  134/74  Pulse: 82 70  75  Temp: 98.6 F (37 C) 98.2 F (36.8 C)  97.5 F (36.4 C)  TempSrc: Axillary Oral  Oral  Resp: 16 16  16   Height: 5\' 6"  (1.676 m)     Weight: 99.1 kg (218 lb 7.6 oz) 99.3 kg (218 lb 14.7 oz)    SpO2: 94% 96% 88% 96%    Intake/Output Summary (Last 24 hours) at 01/13/13 2047 Last data filed at 01/13/13 1700  Gross per 24 hour  Intake    865 ml  Output    450 ml  Net    415 ml    Exam:   General:  Pt is alert, follows commands appropriately, not in acute distress  Cardiovascular: Regular rate and rhythm, S1/S2, no murmurs, no rubs, no gallops  Respiratory: Clear to auscultation bilaterally, no wheezing, no crackles, no rhonchi  Abdomen: Soft, non tender, non distended, bowel sounds present, no guarding  Extremities: No edema, pulses DP and PT palpable bilaterally  Neuro: Grossly nonfocal  Data Reviewed: Basic Metabolic Panel:  Recent Labs Lab 01/12/13 1530 01/13/13 0450  NA 140 138  K 4.2 4.6  CL 103 101  CO2 28 26  GLUCOSE 108* 158*  BUN 27* 27*  CREATININE 1.81* 1.73*  CALCIUM 9.5 9.3   Liver Function Tests:  Recent Labs Lab 01/12/13 1530  AST 10  ALT 9  ALKPHOS 98  BILITOT 0.2*  PROT 7.4  ALBUMIN 3.2*   CBC:  Recent Labs Lab 01/12/13 1530 01/13/13 0450  WBC 6.4 7.3  NEUTROABS 4.3  --   HGB 12.5* 11.9*  HCT 40.2 37.6*  MCV 88.9 87.6  PLT 247 255   Scheduled Meds: . amLODipine  10 mg Oral Daily  . azithromycin  250 mg Oral Daily  . clopidogrel  75 mg Oral Q breakfast  . enoxaparin injection  40 mg Subcutaneous Q24H  . Febuxostat  80 mg Oral Daily  . furosemide  40 mg Intravenous Q12H  . Ipratropium-Albuterol  2 puff Inhalation TID  . Memantine HCl ER  28 mg Oral QPM  . PARoxetine  40 mg  Oral Daily  . risperiDONE  0.5 mg Oral Daily  . rivastigmine  4.6 mg Transdermal Daily  . tamsulosin  0.4 mg Oral Daily  . traZODone  50 mg Oral QHS   Continuous Infusions:   Debbora Presto, MD  [facility] Pager [phone]  If 7PM-7AM, please contact night-coverage [email or web address] Password [email or web address] 01/13/2013, 8:47 PM   LOS: 1 day

## 2013-01-13 NOTE — Care Management (Signed)
Cm spoke with patient at bedside concerning CM consult for [facility] needs. Per pt recently discharged from SNF. Pt permitted CM to speak with son and daughter-n-law concerning discharge planning. Per pt's daughter-n-law family is considering SNF upon discharge. Cm informed family pt is awaiting PT eval. CM provided pt and family a list of HH agencies in [place]. Will continue to follow.   Roxy Manns Rosemond Lyttle,RN,BSN [phone]

## 2013-01-13 NOTE — Progress Notes (Signed)
  Echocardiogram 2D Echocardiogram has been performed.  Ellender Hose A 01/13/2013, 10:23 AM

## 2013-01-14 ENCOUNTER — Inpatient Hospital Stay (HOSPITAL_COMMUNITY): Payer: Medicare Other

## 2013-01-14 LAB — BASIC METABOLIC PANEL
BUN: 42 mg/dL — ABNORMAL HIGH (ref 6–23)
Calcium: 9.2 mg/dL (ref 8.4–10.5)
GFR calc Af Amer: 31 mL/min — ABNORMAL LOW (ref >90)
GFR calc non Af Amer: 27 mL/min — ABNORMAL LOW (ref >90)
Glucose, Bld: 137 mg/dL — ABNORMAL HIGH (ref 70–99)
Sodium: 141 mEq/L (ref 135–145)

## 2013-01-14 LAB — CBC
HCT: 38.2 % — ABNORMAL LOW (ref 39.0–52.0)
Hemoglobin: 12.1 g/dL — ABNORMAL LOW (ref 13.0–17.0)
MCH: 28.2 pg (ref 26.0–34.0)
MCHC: 31.7 g/dL (ref 30.0–36.0)
RDW: 14.6 % (ref 11.5–15.5)

## 2013-01-14 MED ORDER — BIOTENE DRY MOUTH MT LIQD
15.0000 mL | Freq: Two times a day (BID) | OROMUCOSAL | Status: DC
  Administered 2013-01-15 – 2013-01-16 (×3): 15 mL via OROMUCOSAL

## 2013-01-14 MED ORDER — ENOXAPARIN SODIUM 30 MG/0.3ML ~~LOC~~ SOLN
30.0000 mg | SUBCUTANEOUS | Status: DC
  Administered 2013-01-14 – 2013-01-15 (×2): 30 mg via SUBCUTANEOUS
  Filled 2013-01-14 (×3): qty 0.3

## 2013-01-14 MED ORDER — FUROSEMIDE 40 MG PO TABS
40.0000 mg | ORAL_TABLET | Freq: Two times a day (BID) | ORAL | Status: DC
  Administered 2013-01-14 – 2013-01-15 (×2): 40 mg via ORAL
  Filled 2013-01-14 (×4): qty 1

## 2013-01-14 NOTE — Evaluation (Signed)
Physical Therapy Evaluation Patient Details Name: Terry Maziarz Buchan Sr. MRN: [id number] DOB: 12-02-1932 Today's Date: 01/14/2013 Time: 1115-1202 PT Time Calculation (min): 47 min  PT Assessment / Plan / Recommendation Clinical Impression  Pt presents with SOB to the hospiital and with decreased ability with mobility.  Pt slow to intiate movment , flat affect , howver respond withmovment to cues tactile and verbal for movment, mobility and abulation.  Presents with Parkinson-like symptoms today. Will benefit from continued PT and without family present unclear of how they are managing situaion at home.  He staes he is basically at [facility] level and son assists with ADLS /mobility when needed. Recommend continued PT in SNF and/or Home setting, which ever patient /family able to assist with.     PT Assessment  Patient needs continued PT services    Follow Up Recommendations  Supervision/Assistance - 24 hour;Home health PT (if family unable to provide, will need SNF)    Does the patient have the potential to tolerate intense rehabilitation      Barriers to Discharge        Equipment Recommendations   (pt has all equipment)    Recommendations for Other Services     Frequency Min 3X/week    Precautions / Restrictions     Pertinent Vitals/Pain No pain      Mobility  Bed Mobility Bed Mobility: Supine to Sit;Sit to Supine;Scooting to HOB Supine to Sit: 3: Mod assist Scooting to [facility]: 3: Mod assist Details for Bed Mobility Assistance: Cues needed for initiating movment, with tactile and verbal cueing Transfers Transfers: Sit to Stand;Stand to Sit Sit to Stand: 3: Mod assist Stand to Sit: 4: Min assist;With armrests Details for Transfer Assistance: Pt with intial posterior lean requiring simple verbal commands for weight shift forward.  Ambulation/Gait Ambulation/Gait Assistance: 4: Min assist Ambulation Distance (Feet): 20 Feet Assistive device: Rolling walker Ambulation/Gait  Assistance Details: Cues for initiation of steps and cues for large steps with constant cues , other wise stops and/or shuffles Gait Pattern: Step-through pattern;Decreased step length - right;Decreased step length - left;Shuffle;Narrow base of support    Exercises     PT Diagnosis: Difficulty walking;Generalized weakness  PT Problem List: Decreased strength;Decreased range of motion;Decreased activity tolerance;Decreased mobility;Decreased coordination;Decreased knowledge of use of DME PT Treatment Interventions: Gait training;DME instruction;Functional mobility training;Therapeutic activities;Therapeutic exercise;Balance training;Neuromuscular re-education;Patient/family education   PT Goals Acute Rehab PT Goals PT Goal Formulation: With patient Time For Goal Achievement: 01/28/13 Potential to Achieve Goals: Good Pt will go Supine/Side to Sit: with supervision PT Goal: Supine/Side to Sit - Progress: Goal set today Pt will Sit at Edge of Bed: with supervision PT Goal: Sit at Edge Of Bed - Progress: Goal set today Pt will go Sit to Supine/Side: with supervision PT Goal: Sit to Supine/Side - Progress: Goal set today Pt will go Sit to Stand: with supervision PT Goal: Sit to Stand - Progress: Goal set today Pt will go Stand to Sit: with supervision PT Goal: Stand to Sit - Progress: Goal set today Pt will Ambulate: with supervision;51 - 150 feet;with rolling walker PT Goal: Ambulate - Progress: Goal set today  Visit Information  Last PT Received On: 01/28/13 Assistance Needed: +1    Subjective Data  Subjective: I will try Patient Stated Goal: I would like to head back home to my son's house.    Prior Functioning  Home Living Lives With: Family;Son Available Help at Discharge: Available 24 hours/day;Family (per patient) Type of Home: House Home  Access: Ramped entrance Home Layout: One level Home Adaptive Equipment: Bedside commode/3-in-1;Walker - rolling;Wheelchair -  manual;Hospital bed Additional Comments: all history per patient  Prior Function Level of Independence: Independent with assistive device(s);Needs assistance Needs Assistance: Bathing;Dressing;Toileting;Gait;Transfers Toileting: Supervision/set-up Gait Assistance: used RW , but mainly uses WC around house independently Transfer Assistance: states tranfers independently from [facility] to tiolet to bed, etc.  Able to Take Stairs?: No Driving: No Vocation: Retired Musician: No difficulties (slow to respond and with few words for each response)    Cognition  Cognition Arousal/Alertness: Awake/alert Behavior During Therapy: Flat affect Overall Cognitive Status: Within Functional Limits for tasks assessed    Extremity/Trunk Assessment Right Lower Extremity Assessment RLE ROM/Strength/Tone: Deficits RLE ROM/Strength/Tone Deficits: ankle movment easily, knee flexion/ext slow to respond to command, however does with help and at 3+/5, and with some edema BLES  RLE Sensation: WFL - Light Touch Left Lower Extremity Assessment LLE ROM/Strength/Tone: Deficits LLE ROM/Strength/Tone Deficits: ankle movment easily, knee flexion/ext slow to respond to command, however does with help and at 3+/5, and with some edema BLES  LLE Sensation: WFL - Light Touch Trunk Assessment Trunk Assessment: Normal   Balance    End of Session PT - End of Session Equipment Utilized During Treatment: Gait belt Activity Tolerance: Patient tolerated treatment well Patient left: in chair;with call bell/phone within reach Nurse Communication: Mobility status (cues needed for intiation of movement)  GP     Terry Flynn 01/14/2013, 12:26 PM

## 2013-01-14 NOTE — Progress Notes (Signed)
Patient ID: Terry Straka Hickle Sr., male   DOB: [birth year], 79 y.o.   MRN: [id number]  [facility] PROGRESS NOTE  Terry Belote Maule Sr. JXB:[id number] DOB: [birth year] DOA: 01/12/2013 PCP: [name], MD  Brief narrative:  Patient is a 79 year old man with past medical history significant for hypertension, dementia, tobacco abuse, history of stroke, chronic kidney disease, mild COPD who presents to the hospital with progressively worsening shortness of breath that was initially noted 3-4 days prior to admission by family member. This was associated with lower extremity swelling and gargling noises with breathing, pt denied chest pain. In ED, pt had oxygen saturations in low 80% and findings on exam and CXR worrisome for vascular congestion. [facility] asked to admit for further evaluation.   Principal Problem:  Acute hypoxic respiratory distress  - etiology still somewhat unclear and appears to be related to possible diastolic CHF  - 2 D ECHO with normal systolic function, BNP within normal limits on admission  - no evidence of an infectious etiology on exam and VQ scan negative for PE, CT chest also unremarkable for acute events  - will continue Lasix for now but will switch to PO as pt appears to be diuresing well, weight on admission 218 lbs --> 216 today - strict I's and O's, daily weights  - as I do not see clear infectious cause I will discontinue ABX Active Problems:  Diastolic CHF - not clearly evident on 2 D ECHO but clinically appeared to be main cause of respiratory distress - weight is trending down 218 --> 216, improvement in lower extremity swelling - pt maintaining stable oxygen saturations and less crackles on physical exam this AM - change Lasix to PO Hypertension  - reasonable inpatient control  Arteriosclerotic cardiovascular disease (ASCVD)  - continue Plavix and BP control  CKD (chronic kidney disease) stage 3, GFR 30-59 ml/min  - creatinine up and likely secondary to Lasix  that pt has received on admission - will plan on switching to PO today  - BMP in AM  Dementia  - at baseline, PT evaluation pending   Hyperglycemia  - A1C 5.9, pt not diabetic   Consultants:  None Procedures/Studies:  Ct Chest Wo Contrast  01/14/2013  Mild bibasilar atelectasis without other acute abnormality.  Mild paraseptal emphysema.   Dg Chest 2 View 01/12/2013  Stable cardiomegaly and central pulmonary vascular congestion. No significant change compared to prior exam.  Nm Pulmonary Perf And Vent 01/13/2013  Pulmonary embolism absent (very low probability for pulmonary embolism).   Antibiotics:  Zithromax 04/25 --> 04/27  Code Status: DNR  Family Communication: I have left message for son Terry Flynn over the phone as no family available at bedside Disposition Plan: Will need PT evaluation to determine most appropriate discharge plan  HPI/Subjective: No events overnight. Pt denies shortness of breath or chest pain.   Objective: Filed Vitals:   01/13/13 2100 01/14/13 0544 01/14/13 0758 01/14/13 1507  BP: 117/60 124/64  101/61  Pulse: 76 76  77  Temp: 97.3 F (36.3 C) 97.4 F (36.3 C)  97.6 F (36.4 C)  TempSrc: Oral Oral  Oral  Resp: 20 16  18   Height:      Weight:  98 kg (216 lb 0.8 oz)    SpO2: 94% 96% 96% 96%    Intake/Output Summary (Last 24 hours) at 01/14/13 1528 Last data filed at 01/14/13 1500  Gross per 24 hour  Intake    960 ml  Output  1650 ml  Net   -690 ml    Exam:   General:  Pt is alert but confused, follows commands appropriately, not in acute distress  Cardiovascular: Regular rate and rhythm, S1/S2, no murmurs, no rubs, no gallops  Respiratory: Bilateral rhonchi still present but improved, bibasilar crackles  Abdomen: Soft, non tender, non distended, bowel sounds present, no guarding  Extremities: +2 bilateral pitting LE edema, pulses DP and PT palpable bilaterally  Neuro: Grossly non focal, oriented to name only   Data Reviewed: Basic  Metabolic Panel:  Recent Labs Lab 01/12/13 1530 01/13/13 0450 01/14/13 0424  NA 140 138 141  K 4.2 4.6 4.5  CL 103 101 100  CO2 28 26 33*  GLUCOSE 108* 158* 137*  BUN 27* 27* 42*  CREATININE 1.81* 1.73* 2.23*  CALCIUM 9.5 9.3 9.2   Liver Function Tests:  Recent Labs Lab 01/12/13 1530  AST 10  ALT 9  ALKPHOS 98  BILITOT 0.2*  PROT 7.4  ALBUMIN 3.2*   CBC:  Recent Labs Lab 01/12/13 1530 01/13/13 0450 01/14/13 0424  WBC 6.4 7.3 11.2*  NEUTROABS 4.3  --   --   HGB 12.5* 11.9* 12.1*  HCT 40.2 37.6* 38.2*  MCV 88.9 87.6 89.0  PLT 247 255 258   Scheduled Meds: . amLODipine  10 mg Oral Daily  . azithromycin  250 mg Oral Daily  . clopidogrel  75 mg Oral Q breakfast  . enoxaparin  injection  30 mg Subcutaneous Q24H  . Febuxostat  80 mg Oral Daily  . furosemide  40 mg Intravenous Q12H  . Ipratropium-Albuterol  2 puff Inhalation TID  . Memantine HCl ER  28 mg Oral QPM  . PARoxetine  40 mg Oral Daily  . risperiDONE  0.5 mg Oral Daily  . rivastigmine  4.6 mg Transdermal Daily  . tamsulosin  0.4 mg Oral Daily  . traZODone  50 mg Oral QHS   Continuous Infusions:   Debbora Presto, MD  [facility] Pager [phone]  If 7PM-7AM, please contact night-coverage [email or web address] Password [email or web address] 01/14/2013, 3:28 PM   LOS: 2 days

## 2013-01-15 DIAGNOSIS — F0390 Unspecified dementia without behavioral disturbance: Secondary | ICD-10-CM

## 2013-01-15 DIAGNOSIS — N179 Acute kidney failure, unspecified: Secondary | ICD-10-CM

## 2013-01-15 LAB — BASIC METABOLIC PANEL
BUN: 54 mg/dL — ABNORMAL HIGH (ref 6–23)
Calcium: 8.8 mg/dL (ref 8.4–10.5)
Creatinine, Ser: 2.55 mg/dL — ABNORMAL HIGH (ref 0.50–1.35)
GFR calc Af Amer: 26 mL/min — ABNORMAL LOW (ref >90)
GFR calc non Af Amer: 23 mL/min — ABNORMAL LOW (ref >90)

## 2013-01-15 LAB — CBC
HCT: 36.5 % — ABNORMAL LOW (ref 39.0–52.0)
MCH: 27.6 pg (ref 26.0–34.0)
MCHC: 31 g/dL (ref 30.0–36.0)
MCV: 89 fL (ref 78.0–100.0)
RDW: 14.7 % (ref 11.5–15.5)

## 2013-01-15 MED ORDER — SODIUM CHLORIDE 0.9 % IV SOLN: 250.0000 mL | INTRAVENOUS | Status: DC | PRN

## 2013-01-15 NOTE — Progress Notes (Signed)
Patient ID: Terry Crass Arrowood Sr., male   DOB: October 03, 1932, 79 y.o.   MRN: [id number]  [facility] PROGRESS NOTE  Terry Durden Mccoin Sr. WUJ:[id number] DOB: October 09, 1932 DOA: 01/12/2013 PCP: [name], MD  Brief narrative:  Patient is a 79 year old man with past medical history significant for hypertension, dementia, tobacco abuse, history of stroke, chronic kidney disease, mild COPD who presents to the hospital with progressively worsening shortness of breath that was initially noted 3-4 days prior to admission by family member. This was associated with lower extremity swelling and gargling noises with breathing, pt denied chest pain. In ED, pt had oxygen saturations in low 80% and findings on exam and CXR worrisome for vascular congestion. [facility] asked to admit for further evaluation.   Principal Problem:  Acute hypoxic respiratory distress  - etiology still somewhat unclear  - 2 D ECHO with normal systolic function, BNP within normal limits on admission  - no evidence of an infectious etiology on exam and VQ scan negative for PE, CT chest also unremarkable for acute events  - Acute renal failure Secondary to overdiuresis. Have started gentle hydration  218 lbs --> down to reportedly 212 pounds - strict I's and O's, daily weights  - as I do not see clear infectious cause I will discontinue ABX Active Problems:  ? Diastolic CHF - not clearly evident on 2 D ECHO but clinically appeared to be main cause of respiratory distress - weight is trending down 218 --> 212, improvement in lower extremity swelling - pt maintaining stable oxygen saturations and less crackles on physical exam this AM - Stop Lasix because of increased renal failure Hypertension  - reasonable inpatient control  Arteriosclerotic cardiovascular disease (ASCVD)  - continue Plavix and BP control  CKD (chronic kidney disease) stage 3, GFR 30-59 ml/min  - creatinine up and likely secondary to Lasix that pt has received on  admission, worse today. Stopped Lasix and started gentle IV fluids - BMP in AM  Dementia  - at baseline, PT evaluation pending   Hyperglycemia  - A1C 5.9, pt not diabetic   Consultants:  None Procedures/Studies:  Ct Chest Wo Contrast  01/14/2013  Mild bibasilar atelectasis without other acute abnormality.  Mild paraseptal emphysema.   Dg Chest 2 View 01/12/2013  Stable cardiomegaly and central pulmonary vascular congestion. No significant change compared to prior exam.  Nm Pulmonary Perf And Vent 01/13/2013  Pulmonary embolism absent (very low probability for pulmonary embolism).   Antibiotics:  Zithromax 04/25 --> 04/27  Code Status: DNR  Family Communication: Left message with some Disposition Plan: Short-term skilled nursing, tomorrow  HPI/Subjective: Patient feeling better. Feels weak. Tired. Breathing a whole lot easier.  Objective: Filed Vitals:   01/15/13 0700 01/15/13 0757 01/15/13 1054 01/15/13 1413  BP:    112/65  Pulse:    76  Temp:    97.7 F (36.5 C)  TempSrc:    Axillary  Resp:    18  Height:      Weight: 96.4 kg (212 lb 8.4 oz)     SpO2:  96% 95% 94%    Intake/Output Summary (Last 24 hours) at 01/15/13 1825 Last data filed at 01/15/13 1300  Gross per 24 hour  Intake    483 ml  Output    325 ml  Net    158 ml    Exam:   General:  Pt is alert but confused, follows commands appropriately, not in acute distress  Cardiovascular: Regular rate and  rhythm, S1/S2, no murmurs, no rubs, no gallops  Respiratory: Few basilar crackles  Abdomen: Soft, non tender, non distended, bowel sounds present, no guarding  Extremities: +2 bilateral pitting LE edema, pulses DP and PT palpable bilaterally  Neuro: Grossly non focal, oriented to name only   Data Reviewed: Basic Metabolic Panel:  Recent Labs Lab 01/12/13 1530 01/13/13 0450 01/14/13 0424 01/15/13 0355  NA 140 138 141 137  K 4.2 4.6 4.5 3.8  CL 103 101 100 97  CO2 28 26 33* 31  GLUCOSE 108*  158* 137* 105*  BUN 27* 27* 42* 54*  CREATININE 1.81* 1.73* 2.23* 2.55*  CALCIUM 9.5 9.3 9.2 8.8   Liver Function Tests:  Recent Labs Lab 01/12/13 1530  AST 10  ALT 9  ALKPHOS 98  BILITOT 0.2*  PROT 7.4  ALBUMIN 3.2*   CBC:  Recent Labs Lab 01/12/13 1530 01/13/13 0450 01/14/13 0424 01/15/13 0355  WBC 6.4 7.3 11.2* 9.8  NEUTROABS 4.3  --   --   --   HGB 12.5* 11.9* 12.1* 11.3*  HCT 40.2 37.6* 38.2* 36.5*  MCV 88.9 87.6 89.0 89.0  PLT 247 255 258 226   Scheduled Meds: . amLODipine  10 mg Oral Daily  . azithromycin  250 mg Oral Daily  . clopidogrel  75 mg Oral Q breakfast  . enoxaparin  injection  30 mg Subcutaneous Q24H  . Febuxostat  80 mg Oral Daily  . furosemide  40 mg Intravenous Q12H  . Ipratropium-Albuterol  2 puff Inhalation TID  . Memantine HCl ER  28 mg Oral QPM  . PARoxetine  40 mg Oral Daily  . risperiDONE  0.5 mg Oral Daily  . rivastigmine  4.6 mg Transdermal Daily  . tamsulosin  0.4 mg Oral Daily  . traZODone  50 mg Oral QHS   Continuous Infusions:   Hollice Espy, MD  [facility] Pager [phone]  If 7PM-7AM, please contact night-coverage [email or web address] Password [email or web address] 01/15/2013, 6:25 PM   LOS: 3 days

## 2013-01-15 NOTE — Care Management (Signed)
CARE MANAGEMENT NOTE 01/15/2013  Patient:  IGNACIO, LOWDER   Account Number:  [id number]  Date Initiated:  01/13/2013  Documentation initiated by:  Stuti Sandin  Subjective/Objective Assessment:   79 yo male admitted with acute respiratory failure.     Action/Plan:   SNF vs Home   Anticipated DC Date:     Anticipated DC Plan:  SKILLED NURSING FACILITY  In-house referral  Clinical Social Worker      DC Planning Services  CM consult      Choice offered to / List presented to:  C-1 Patient           Status of service:  Completed, signed off Medicare Important Message given?   (If response is "NO", the following Medicare IM given date fields will be blank) Date Medicare IM given:   Date Additional Medicare IM given:    Discharge Disposition:    Per UR Regulation:  Reviewed for med. necessity/level of care/duration of stay  If discussed at Long Length of Stay Meetings, dates discussed:    Comments:  01/13/13 1748 Leonie Green [phone] Cm spoke with patient at bedside concerning CM consult for [facility] needs. Per pt recently discharged from SNF. Pt permitted CM to speak with son and daughter-n-law concerning discharge planning. Per pt's daughter-n-law family is considering SNF upon discharge. Cm informed family pt is awaiting PT eval. CM provided pt and family a list of HH agencies in [place]. Will continue to follow

## 2013-01-15 NOTE — Progress Notes (Signed)
Clinical Social Work Department BRIEF PSYCHOSOCIAL ASSESSMENT 01/15/2013  Patient:  Terry Flynn, Terry Flynn     Account Number:  [id number]     Admit date:  01/12/2013  Clinical Social Worker:  Jacelyn Grip  Date/Time:  01/15/2013 10:30 AM  Referred by:  Physician  Date Referred:  01/15/2013 Referred for  SNF Placement   Other Referral:   Interview type:  Patient Other interview type:   and patient son at bedside    PSYCHOSOCIAL DATA Living Status:  FAMILY Admitted from facility:   Level of care:   Primary support name:  [name] Gosch/son/[phone] Primary support relationship to patient:  CHILD, ADULT Degree of support available:   adequate    CURRENT CONCERNS Current Concerns  Post-Acute Placement   Other Concerns:    SOCIAL WORK ASSESSMENT / PLAN CSW received referral for New SNF.    CSW met with pt and pt son at bedside. CSW discussed PT recommendation for 24 supervisions/assist at home vs rehab at [facility]. Pt initally refusing SNF. Pt son discussed that pt son and pt daughter-in-law feel they would be unable to manage care of pt at this time and prefer rehab. Pt eventually agreed to rehab. Pt has been at [facility] before and pt family was very happy with pt rehab progress of pt at facility and are interested in pt returning to [facility].    CSW completed FL2, sent information to [facility], and submitted [employer] authorization. Per [facility], facility can accept pt when medically stable.    CSW to facilitate pt discharge needs to [facility] when pt medically stable for discharge and [facility] authorization received.   Assessment/plan status:  Psychosocial Support/Ongoing Assessment of Needs Other assessment/ plan:   discharge planning   Information/referral to community resources:   Referral [facility]    PATIENT'S/FAMILY'S RESPONSE TO PLAN OF CARE: Pt alert and oriented, but has periods of confusion. Pt family eager for pt to return  to [facility] for rehab as they feel that pt will benefit from this plan the most after discharge.       Jacklynn Lewis, MSW, LCSWA  Clinical Social Work [phone]

## 2013-01-15 NOTE — Evaluation (Signed)
Occupational Therapy Evaluation Patient Details Name: Terry Mazzoni Jacquez Sr. MRN: [id number] DOB: 1933-03-19 Today's Date: 01/15/2013 Time: 4098-1191 OT Time Calculation (min): 24 min  OT Assessment / Plan / Recommendation Clinical Impression  Pt is a 79 yo male admitted for SOB and O2 sats in 80 on arrival to ED with h/o dementia.  Pt with the deficits listed below.  Pt would benefit from cont OT to increase I and participation in basic adls to decrease the burden of care at home.      OT Assessment  Patient needs continued OT Services    Follow Up Recommendations  SNF    Barriers to Discharge Decreased caregiver support Son at home to assist but son feels pt need some rehab before he can handle him at home.  Son on disability and also walks with cane.  Equipment Recommendations  None recommended by OT    Recommendations for Other Services    Frequency  Min 2X/week    Precautions / Restrictions Precautions Precautions: Fall Precaution Comments: Pt with posterior lean when standing.  Has had one fall in last 3-6 months. Restrictions Weight Bearing Restrictions: No   Pertinent Vitals/Pain Pt with no c/o pain.  O2 sats in 90s on 4 L O2 [place].    ADL  Eating/Feeding: Simulated;Set up Where Assessed - Eating/Feeding: Bed level Grooming: Performed;Teeth care;Set up;Other (comment) (cues to be thorough.) Where Assessed - Grooming: Supported sitting Upper Body Bathing: Simulated;Set up;Other (comment) (VCs for initiation and follow thru) Where Assessed - Upper Body Bathing: Supported sitting Lower Body Bathing: Moderate assistance;Simulated Where Assessed - Lower Body Bathing: Supported sit to stand Upper Body Dressing: Simulated;Minimal assistance Where Assessed - Upper Body Dressing: Supported sitting Lower Body Dressing: Simulated;Moderate assistance Where Assessed - Lower Body Dressing: Supported sit to stand Toilet Transfer: Performed;Minimal assistance Toilet Transfer Method:  Stand pivot;Sit to Barista: Materials engineer and Hygiene: Moderate assistance Where Assessed - Toileting Clothing Manipulation and Hygiene: Standing Equipment Used: Gait belt;Rolling walker Transfers/Ambulation Related to ADLs: Pt walked into hallway with min assist x1 w O2.  Pt with Parkinson like symptoms.  Does okay with walking but when he stops he has a hard time getting started again. ADL Comments: Pt's family does a lot of pt's adls.  Feel he could be doing more at home.  not sure if it is just faster for them to do if for him or if he refuses to do it.    OT Diagnosis: Generalized weakness;Cognitive deficits  OT Problem List: Decreased strength;Decreased activity tolerance;Impaired balance (sitting and/or standing);Decreased cognition;Decreased safety awareness;Decreased knowledge of use of DME or AE OT Treatment Interventions: Self-care/ADL training;Therapeutic activities   OT Goals Acute Rehab OT Goals OT Goal Formulation: With patient/family Time For Goal Achievement: 01/29/13 Potential to Achieve Goals: Good ADL Goals Pt Will Perform Grooming: with supervision;Standing at sink ADL Goal: Grooming - Progress: Goal set today Pt Will Perform Lower Body Bathing: with supervision;Sit to stand from chair;Sit to stand in shower ADL Goal: Lower Body Bathing - Progress: Goal set today Pt Will Perform Lower Body Dressing: with supervision;Sit to stand from chair ADL Goal: Lower Body Dressing - Progress: Goal set today Pt Will Perform Tub/Shower Transfer: Tub transfer;with min assist;Shower seat with back ADL Goal: Tub/Shower Transfer - Progress: Goal set today Additional ADL Goal #1: Pt will complete all toileting with S on comfort height commode with rails. ADL Goal: Additional Goal #1 - Progress: Goal set today  Visit  Information  Last OT Received On: 01/15/13 Assistance Needed: +1 PT/OT Co-Evaluation/Treatment: Yes     Subjective Data  Subjective: "I think it is 1971." Patient Stated Goal: none stated.  Family wants rehab.   Prior Functioning     Home Living Lives With: Family;Son Available Help at Discharge: Available 24 hours/day;Family Type of Home: House Home Access: Ramped entrance Home Layout: One level Bathroom Shower/Tub: Tub/shower unit;Door Bathroom Toilet: Handicapped height Bathroom Accessibility: Yes How Accessible: Accessible via walker;Accessible via wheelchair Home Adaptive Equipment: Bedside commode/3-in-1;Walker - rolling;Shower chair with back;Grab bars around toilet Additional Comments: history per son Prior Function Level of Independence: Needs assistance Needs Assistance: Bathing;Dressing;Toileting;Meal Prep;Light Housekeeping Bath: Minimal Dressing: Maximal Toileting: Minimal Meal Prep: Total Light Housekeeping: Total Gait Assistance: used RW , but mainly uses WC around house independently Transfer Assistance: states tranfers independently from [facility] to tiolet to bed, etc.  Able to Take Stairs?: No Driving: No Vocation: Retired Musician: No difficulties Dominant Hand: Right         Vision/Perception Vision - History Baseline Vision: Wears glasses all the time Patient Visual Report: No change from baseline Vision - Assessment Vision Assessment: Vision not tested   [employer] Arousal/Alertness: Awake/alert Behavior During Therapy: WFL for tasks assessed/performed Overall Cognitive Status: History of cognitive impairments - at baseline    Extremity/Trunk Assessment Right Upper Extremity Assessment RUE ROM/Strength/Tone: Within functional levels RUE Sensation: WFL - Light Touch RUE Coordination: WFL - gross/fine motor Left Upper Extremity Assessment LUE ROM/Strength/Tone: Within functional levels LUE Sensation: WFL - Light Touch LUE Coordination: WFL - gross/fine motor Trunk Assessment Trunk Assessment: Normal      Mobility Bed Mobility Bed Mobility: Supine to Sit;Sit to Supine;Sitting - Scoot to Edge of Bed Supine to Sit: 3: Mod assist Sitting - Scoot to Edge of Bed: 3: Mod assist Details for Bed Mobility Assistance: Cues needed for initiating movment, with tactile and verbal cueing Transfers Transfers: Sit to Stand;Stand to Sit Sit to Stand: 4: Min guard;From bed Stand to Sit: 4: Min assist;With armrests Details for Transfer Assistance: Pt with intial posterior lean requiring simple verbal commands for weight shift forward.      Exercise     Balance     End of Session OT - End of Session Equipment Utilized During Treatment: Gait belt Activity Tolerance: Patient tolerated treatment well Patient left: in chair;with call bell/phone within reach;with family/visitor present Nurse Communication: Mobility status  GO     Hope Budds 01/15/2013, 11:07 AM [phone]

## 2013-01-15 NOTE — Progress Notes (Signed)
Patient screened for long-term disease management services with [facility] as a benefit of his [employer]. Noted patient to go to SNF at discharge. Therefore, [facility] will not be appropriate at current time.       Melville Engen,MSN-Ed RN,BSN, [facility], [phone]

## 2013-01-15 NOTE — Progress Notes (Signed)
Physical Therapy Treatment Patient Details Name: Terry Clymer Schoenberg Sr. MRN: [id number] DOB: [birth year] Today's Date: 01/15/2013 Time: 1029-1050 PT Time Calculation (min): 21 min  PT Assessment / Plan / Recommendation Comments on Treatment Session  Pt. continues with festinating, posterior leaning and decreased safety with walking. Son reports desire for pt. to go to rehab. Recommend this is good option.     Follow Up Recommendations  Supervision/Assistance - 24 hour;Home health PT;SNF     Does the patient have the potential to tolerate intense rehabilitation     Barriers to Discharge        Equipment Recommendations  None recommended by PT    Recommendations for Other Services    Frequency Min 3X/week   Plan Frequency remains appropriate;Discharge plan needs to be updated    Precautions / Restrictions Precautions Precautions: Fall Precaution Comments: Pt with posterior lean when standing.  Has had one fall in last 3-6 months. Restrictions Weight Bearing Restrictions: No   Pertinent Vitals/Pain sats on 4 l 96% after ambulation.    Mobility  Bed Mobility Bed Mobility: Supine to Sit;Sit to Supine;Sitting - Scoot to Edge of Bed Supine to Sit: 3: Mod assist Sitting - Scoot to Edge of Bed: 3: Mod assist Details for Bed Mobility Assistance: Cues needed for initiating movment, with tactile and verbal cueing Transfers Transfers: Sit to Stand;Stand to Sit Sit to Stand: 4: Min guard;From bed Stand to Sit: 4: Min assist;With armrests Details for Transfer Assistance: Pt with intial posterior lean requiring simple verbal commands for weight shift forward.  Ambulation/Gait Ambulation/Gait Assistance: 4: Min assist Ambulation Distance (Feet): 40 Feet Assistive device: Rolling walker Ambulation/Gait Assistance Details: has difficulty initiating stepa after stopping, festinating  near stopping . Gait Pattern: Step-through pattern;Decreased step length - right;Decreased step length -  left;Shuffle;Narrow base of support Gait velocity: slow,     Exercises     PT Diagnosis:    PT Problem List:   PT Treatment Interventions:     PT Goals Acute Rehab PT Goals Pt will go Supine/Side to Sit: with supervision PT Goal: Supine/Side to Sit - Progress: Progressing toward goal Pt will Sit at [facility] of Bed: with supervision PT Goal: Sit at [facility] Of Bed - Progress: Progressing toward goal Pt will go Sit to Stand: with supervision PT Goal: Sit to Stand - Progress: Progressing toward goal Pt will go Stand to Sit: with supervision PT Goal: Stand to Sit - Progress: Progressing toward goal Pt will Ambulate: with supervision;51 - 150 feet;with rolling walker PT Goal: Ambulate - Progress: Progressing toward goal  Visit Information  Last PT Received On: 01/15/13 Assistance Needed: +1    Subjective Data  Subjective: I will try to walk.   Cognition  Cognition Arousal/Alertness: Awake/alert Behavior During Therapy: WFL for tasks assessed/performed Overall Cognitive Status: History of cognitive impairments - at baseline    Balance     End of Session PT - End of Session Equipment Utilized During Treatment: Gait belt Activity Tolerance: Patient tolerated treatment well Patient left: in chair;with call bell/phone within reach;with family/visitor present Nurse Communication: Mobility status   GP     Rada Hay 01/15/2013, 1:29 PM

## 2013-01-15 NOTE — Progress Notes (Addendum)
Clinical Social Work Department CLINICAL SOCIAL WORK PLACEMENT NOTE 01/15/2013  Patient:  Terry Flynn, Terry Flynn  Account Number:  [id number] Admit date:  01/12/2013  Clinical Social Worker:  Jacelyn Grip  Date/time:  01/15/2013 11:00 AM  Clinical Social Work is seeking post-discharge placement for this patient at the following level of care:   SKILLED NURSING   (*CSW will update this form in Epic as items are completed)   01/15/2013  Patient/family provided with [facility] Department of Clinical Social Work's list of facilities offering this level of care within the geographic area requested by the patient (or if unable, by the patient's family).  01/15/2013  Patient/family informed of their freedom to choose among providers that offer the needed level of care, that participate in Medicare, Medicaid or managed care program needed by the patient, have an available bed and are willing to accept the patient.  01/15/2013  Patient/family informed of [facility]' ownership interest in [facility], as well as of the fact that they are under no obligation to receive care at this facility.  PASARR submitted to EDS on 01/15/2013 PASARR number received from EDS on 01/15/2013-existing  FL2 transmitted to all facilities in geographic area requested by pt/family on  01/15/2013 FL2 transmitted to all facilities within larger geographic area on   Patient informed that his/her managed care company has contracts with or will negotiate with  certain facilities, including the following:     Patient/family informed of bed offers received:  01/15/2013 Patient chooses bed at [facility] Physician recommends and patient chooses bed at    Patient to be transferred to  on  [facility] on 01/16/2013 Patient to be transferred to facility by ambulance Sharin Mons)  The following physician request were entered in Epic:   Additional Comments:   Jacklynn Lewis, MSW, LCSWA  Clinical  Social Work [phone]

## 2013-01-16 LAB — BASIC METABOLIC PANEL
CO2: 32 mEq/L (ref 19–32)
Calcium: 8.8 mg/dL (ref 8.4–10.5)
Creatinine, Ser: 2.46 mg/dL — ABNORMAL HIGH (ref 0.50–1.35)
GFR calc Af Amer: 27 mL/min — ABNORMAL LOW (ref >90)
GFR calc non Af Amer: 24 mL/min — ABNORMAL LOW (ref >90)

## 2013-01-16 MED ORDER — CLONAZEPAM 0.5 MG PO TABS: 0.5000 mg | ORAL_TABLET | Freq: Two times a day (BID) | ORAL | Status: DC | PRN

## 2013-01-16 MED ORDER — ALBUTEROL SULFATE HFA 108 (90 BASE) MCG/ACT IN AERS: 2.0000 | INHALATION_SPRAY | Freq: Four times a day (QID) | RESPIRATORY_TRACT | Status: DC | PRN

## 2013-01-16 MED ORDER — SODIUM CHLORIDE 0.9 % IV SOLN
250.0000 mL | INTRAVENOUS | Status: DC | PRN
  Administered 2013-01-16: 09:00:00 via INTRAVENOUS

## 2013-01-16 MED ORDER — SODIUM CHLORIDE 0.9 % IV SOLN: 1000.0000 mL | INTRAVENOUS | Status: DC

## 2013-01-16 NOTE — Progress Notes (Signed)
Pt for discharge to [facility].  Per facility, pt can maintain IV access and receive IV fluids at SNF. CSW notified MD and plan is for discharge today with continued IV fluids for 1-2 days.   CSW notified pt family.  CSW facilitated pt discharge needs including contacting facility, faxing pt discharge information via [facility], discussing with pt at bedside and pt family via telephone, providing RN phone number to call report, receiving [facility] Medicare SNF and ambulance authorizations, and arranging ambulance transport to [facility].  No further social work needs identified at this time.   CSW signing off.   Jacklynn Lewis, MSW, LCSWA  Clinical Social Work [phone]

## 2013-01-16 NOTE — Discharge Summary (Addendum)
Physician Discharge Summary  Terry Miner Liberto Sr. ZOX:[id number] DOB: 1933-09-12 DOA: 01/12/2013  PCP: [name], MD  Admit date: 01/12/2013 Discharge date: 01/16/2013  Time spent: 25 minutes  Recommendations for Outpatient Follow-up:  1. Patient: Short-term skilled nursing. He will continue on normal saline at 100 cc/hour. Repeat basic metabolic panel to check renal function will be done on 4/30. Patient will be continued on IV fluids until creatinine is down to baseline of 1.6-1.7  Discharge Diagnoses:  Principal Problem:   ARF (acute renal failure) Active Problems:   TOBACCO ABUSE   Hypertension   CVA (cerebral infarction)   Arteriosclerotic cardiovascular disease (ASCVD)   SOB (shortness of breath)   CKD (chronic kidney disease) stage 3, GFR 30-59 ml/min   Acute respiratory failure with hypoxia   Dementia  COPD exacerbation  Discharge Condition: Improved, going to short-term skilled nursing  Diet recommendation: Heart healthy  Filed Weights   01/14/13 0544 01/15/13 0700 01/16/13 0609  Weight: 98 kg (216 lb 0.8 oz) 96.4 kg (212 lb 8.4 oz) 97.6 kg (215 lb 2.7 oz)    History of present illness:  On 4/25:Patient is a 79 year old man with past medical history significant for hypertension, dementia, tobacco abuse, history of stroke, chronic kidney disease, mild COPD who presents to the hospital today with shortness of breath. He is a very poor historian on account of his dementia. His daughter-in-law who is also his caregiver is at bedside. She states that about 3 days ago he started complaining of shortness of breath. She also noticed that his legs had become very swollen and he seemed to be gurgling. They decided to bring him into the hospital today where I was told in report that he had an initial oxygen saturation of 80% on room air. However this is not documented in his chart. The lowest that I can see is 93% on room air. His chest x-ray showed some pulmonary vascular  congestion, had an elevated d-dimer however CT scan was not obtained given chronic kidney disease. We have been asked to admit him for further evaluation and management.   Hospital Course:  Principal Problem:   ARF (acute renal failure): Patient's baseline creatinine is around 1.6-1.7. Over the next few days, with diuresis, his creatinine steadily increased peaking in size 2.55. Lasix was stopped and he was started on gentle IV fluids and his creatinine on day of discharge is down to 2.46 with a BUN of 60. Have spoken with a short-term skilled nursing facility and they can continue IV fluids. Given normal echocardiogram, we'll continue fluids at 100 cc per hour and recheck basic metabolic panel on 4/30. Continue fluids and her creatinine down to baseline.     TOBACCO ABUSE: Quit smoking under a year ago    Hypertension: Stable. Continue home meds    CVA (cerebral infarction): Stable.    Arteriosclerotic cardiovascular disease (ASCVD): Stable medical issues during this hospitalization.    SOB (shortness of breath): Felt to be secondary to COPD exacerbation. BNP and echocardiogram were normal. History with oxygen and nebulizers. On day of discharge she was ambulated on room air and his sats were 94%.    CKD (chronic kidney disease) stage 3, GFR 30-59 ml/min: See above.   Acute respiratory failure with hypoxia: See above    Dementia: Mild.   Procedures:  Echocardiogram done 4/26-noting normal systolic and diastolic dysfunction  Consultations:  None  Discharge Exam: Filed Vitals:   01/15/13 1413 01/15/13 2157 01/16/13 8119 01/16/13 1478  BP: 112/65 120/61 133/67   Pulse: 76 76 75   Temp: 97.7 F (36.5 C) 98.2 F (36.8 C) 97.9 F (36.6 C)   TempSrc: Axillary Oral Oral   Resp: 18 16 16    Height:      Weight:   97.6 kg (215 lb 2.7 oz)   SpO2: 94% 93% 94% 95%    General: Alert and oriented x3, no acute distress Cardiovascular: Regular rate and rhythm, S1-S2 Respiratory:  Decreased breath sounds throughout, more so at the bases Abdomen: Soft, morbidly obese, nontender, positive bowel sounds Extremities: No clubbing or cyanosis, 1+ edema  Discharge Instructions  Discharge Orders   Future Appointments Provider Department Dept Phone   02/26/2013 2:00 PM Ernestina Penna, MD [facility] [phone]   Future Orders Complete By Expires     Diet - low sodium heart healthy  As directed     Increase activity slowly  As directed         Medication List    STOP taking these medications       predniSONE 50 MG tablet  Commonly known as:  DELTASONE      TAKE these medications       albuterol 108 (90 BASE) MCG/ACT inhaler  Commonly known as:  PROVENTIL HFA;VENTOLIN HFA  Inhale 2 puffs into the lungs every 6 (six) hours as needed for wheezing.     amLODipine 10 MG tablet  Commonly known as:  NORVASC  Take 10 mg by mouth daily.     clonazePAM 0.5 MG tablet  Commonly known as:  KLONOPIN  Take 0.5 mg by mouth 2 (two) times daily as needed for anxiety.     clopidogrel 75 MG tablet  Commonly known as:  PLAVIX  Take 75 mg by mouth daily.     Ipratropium-Albuterol 20-100 MCG/ACT Aers respimat  Commonly known as:  COMBIVENT  Inhale 2 puffs into the lungs 3 (three) times daily.     NAMENDA XR 28 MG Cp24  Generic drug:  Memantine HCl ER  Take 1 tablet by mouth every evening.     PARoxetine 40 MG tablet  Commonly known as:  PAXIL  Take 40 mg by mouth every morning.     risperiDONE 0.5 MG tablet  Commonly known as:  RISPERDAL  Take 1 tablet (0.5 mg total) by mouth daily.     rivastigmine 4.6 mg/24hr  Commonly known as:  EXELON  Place 1 patch onto the skin daily.     sodium chloride 0.9 % infusion  Inject 1,000 mLs into the vein continuous.     tamsulosin 0.4 MG Caps  Commonly known as:  FLOMAX  Take 1 capsule (0.4 mg total) by mouth daily.     traZODone 50 MG tablet  Commonly known as:  DESYREL  Take 50 mg by mouth at  bedtime.     ULORIC 80 MG Tabs  Generic drug:  Febuxostat  Take 80 mg by mouth daily.           Follow-up Information   Follow up with [name], MD In 1 month.   Contact information:   [address] [place] [address] [phone]        The results of significant diagnostics from this hospitalization (including imaging, microbiology, ancillary and laboratory) are listed below for reference.    Significant Diagnostic Studies: Dg Chest 2 View  01/12/2013  IMPRESSION: Stable cardiomegaly and central pulmonary vascular congestion.  No significant change compared to prior  exam.   Original Report Authenticated By: Lupita Raider.,  M.D.    Ct Chest Wo Contrast  01/14/2013    IMPRESSION: Mild bibasilar atelectasis without other acute abnormality.  Mild paraseptal emphysema.   Original Report Authenticated By: Harmon Pier, M.D.    Nm Pulmonary Perf And Vent  01/13/2013    IMPRESSION: Pulmonary embolism absent (very low probability for pulmonary embolism).   Original Report Authenticated By: Tacey Ruiz, MD        Labs: Basic Metabolic Panel:  Recent Labs Lab 01/12/13 1530 01/13/13 0450 01/14/13 0424 01/15/13 0355 01/16/13 0400  NA 140 138 141 137 135  K 4.2 4.6 4.5 3.8 3.8  CL 103 101 100 97 95*  CO2 28 26 33* 31 32  GLUCOSE 108* 158* 137* 105* 131*  BUN 27* 27* 42* 54* 60*  CREATININE 1.81* 1.73* 2.23* 2.55* 2.46*  CALCIUM 9.5 9.3 9.2 8.8 8.8   Liver Function Tests:  Recent Labs Lab 01/12/13 1530  AST 10  ALT 9  ALKPHOS 98  BILITOT 0.2*  PROT 7.4  ALBUMIN 3.2*   CBC:  Recent Labs Lab 01/12/13 1530 01/13/13 0450 01/14/13 0424 01/15/13 0355  WBC 6.4 7.3 11.2* 9.8  NEUTROABS 4.3  --   --   --   HGB 12.5* 11.9* 12.1* 11.3*  HCT 40.2 37.6* 38.2* 36.5*  MCV 88.9 87.6 89.0 89.0  PLT 247 255 258 226   BNP: BNP (last 3 results)  Recent Labs  08/27/12 1030 12/13/12 1946 01/12/13 1530  PROBNP 108.8 119.1 102.4      Signed:  Jeanifer Halliday K  [facility] 01/16/2013, 11:40 AM

## 2013-01-16 NOTE — Progress Notes (Addendum)
Occupational Therapy Treatment Patient Details Name: Terry Agrusa Morten Sr. MRN: [id number] DOB: 07/09/1933 Today's Date: 01/16/2013 Time: 4098-1191 OT Time Calculation (min): 21 min  OT Assessment / Plan / Recommendation Comments on Treatment Session Pt tolerated session well. Supposed to d/c to Snf today.    Follow Up Recommendations  Supervision/Assistance - 24 hour    Barriers to Discharge       Equipment Recommendations  None recommended by OT    Recommendations for Other Services    Frequency Min 2X/week   Plan Discharge plan remains appropriate    Precautions / Restrictions Precautions Precautions: Fall Precaution Comments: Pt with posterior lean when standing.  Has had one fall in last 3-6 months.        ADL  Grooming: Performed;Teeth care;Set up;Supervision/safety Where Assessed - Grooming: Unsupported sitting Toilet Transfer: Moderate assistance Toilet Transfer Method: Stand pivot Equipment Used: Rolling walker ADL Comments: up to EOB for grooming. Pt able to stand from EOB with min assist but then has much difficulty stepping around to chair. Small, shuffled steps and posteriorly lean. Needs assist to direct RW and verbal cues throughout. Supposed to d/c to SNF today.    OT Diagnosis:    OT Problem List:   OT Treatment Interventions:     OT Goals ADL Goals ADL Goal: Grooming - Progress: Progressing toward goals ADL Goal: Additional Goal #1 - Progress: Not progressing (required more assist today)  Visit Information  Last OT Received On: 01/16/13    Subjective Data  Subjective: doing alright Patient Stated Goal: didnt state. agreeable to OT   Prior Functioning       Cognition  Cognition Arousal/Alertness: Awake/alert Overall Cognitive Status: History of cognitive impairments - at baseline    Mobility  Bed Mobility Bed Mobility: Supine to Sit Supine to Sit: 3: Mod assist;HOB elevated Sitting - Scoot to Edge of Bed: 3: Mod assist;With  rail Transfers Transfers: Sit to Stand;Stand to Sit Sit to Stand: 4: Min assist;With upper extremity assist;From bed Stand to Sit: 4: Min assist;With upper extremity assist;To chair/3-in-1 Details for Transfer Assistance: verbal cues for hand placement    Exercises      Balance     End of Session OT - End of Session Activity Tolerance: Patient tolerated treatment well Patient left: in chair;with call bell/phone within reach  GO     Lennox Laity [phone] 01/16/2013, 1:35 PM

## 2013-01-22 ENCOUNTER — Telehealth: Payer: Self-pay | Admitting: Family Medicine

## 2013-01-23 NOTE — Telephone Encounter (Signed)
Notified daughter in law to contact social services worker with nursing home and they can take care of this for the family

## 2013-01-23 NOTE — Telephone Encounter (Signed)
Called back this morning and really need to speak with Joyce Gross or Dr. Christell Constant regarding the patient. Please call

## 2013-01-29 ENCOUNTER — Telehealth: Payer: Self-pay | Admitting: Family Medicine

## 2013-02-06 ENCOUNTER — Telehealth: Payer: Self-pay | Admitting: *Deleted

## 2013-02-06 NOTE — Telephone Encounter (Signed)
Left message on daughter in laws cell phone

## 2013-02-16 ENCOUNTER — Other Ambulatory Visit: Payer: Self-pay

## 2013-02-16 ENCOUNTER — Telehealth: Payer: Self-pay | Admitting: *Deleted

## 2013-02-16 NOTE — Telephone Encounter (Signed)
Nurse from [facility] called stating that she was assessing patient and he had just been discharged from [facility]. The family was a bit concerned about patients condition and they feel he may be dehydrated. Nurse requesting order to draw CBC, Bmet, and collect a UA. Advised nurse to go ahead with orders. FYI

## 2013-02-17 ENCOUNTER — Emergency Department (HOSPITAL_COMMUNITY): Payer: Medicare Other

## 2013-02-17 ENCOUNTER — Inpatient Hospital Stay (HOSPITAL_COMMUNITY)
Admission: EM | Admit: 2013-02-17 | Discharge: 2013-02-20 | DRG: 690 | Disposition: A | Discharge status: Skilled Nursing Facility | Payer: Medicare Other | Attending: Internal Medicine | Admitting: Internal Medicine

## 2013-02-17 ENCOUNTER — Encounter (HOSPITAL_COMMUNITY): Payer: Self-pay | Admitting: *Deleted

## 2013-02-17 DIAGNOSIS — E041 Nontoxic single thyroid nodule: Secondary | ICD-10-CM

## 2013-02-17 DIAGNOSIS — I252 Old myocardial infarction: Secondary | ICD-10-CM

## 2013-02-17 DIAGNOSIS — K625 Hemorrhage of anus and rectum: Secondary | ICD-10-CM

## 2013-02-17 DIAGNOSIS — J449 Chronic obstructive pulmonary disease, unspecified: Secondary | ICD-10-CM | POA: Diagnosis present

## 2013-02-17 DIAGNOSIS — D649 Anemia, unspecified: Secondary | ICD-10-CM

## 2013-02-17 DIAGNOSIS — K219 Gastro-esophageal reflux disease without esophagitis: Secondary | ICD-10-CM | POA: Diagnosis present

## 2013-02-17 DIAGNOSIS — R0602 Shortness of breath: Secondary | ICD-10-CM

## 2013-02-17 DIAGNOSIS — I2510 Atherosclerotic heart disease of native coronary artery without angina pectoris: Secondary | ICD-10-CM

## 2013-02-17 DIAGNOSIS — I129 Hypertensive chronic kidney disease with stage 1 through stage 4 chronic kidney disease, or unspecified chronic kidney disease: Secondary | ICD-10-CM | POA: Diagnosis present

## 2013-02-17 DIAGNOSIS — F411 Generalized anxiety disorder: Secondary | ICD-10-CM | POA: Diagnosis present

## 2013-02-17 DIAGNOSIS — F329 Major depressive disorder, single episode, unspecified: Secondary | ICD-10-CM | POA: Diagnosis present

## 2013-02-17 DIAGNOSIS — N179 Acute kidney failure, unspecified: Secondary | ICD-10-CM

## 2013-02-17 DIAGNOSIS — R296 Repeated falls: Secondary | ICD-10-CM

## 2013-02-17 DIAGNOSIS — J9601 Acute respiratory failure with hypoxia: Secondary | ICD-10-CM

## 2013-02-17 DIAGNOSIS — I639 Cerebral infarction, unspecified: Secondary | ICD-10-CM

## 2013-02-17 DIAGNOSIS — N200 Calculus of kidney: Secondary | ICD-10-CM

## 2013-02-17 DIAGNOSIS — R627 Adult failure to thrive: Secondary | ICD-10-CM | POA: Diagnosis present

## 2013-02-17 DIAGNOSIS — E785 Hyperlipidemia, unspecified: Secondary | ICD-10-CM | POA: Diagnosis present

## 2013-02-17 DIAGNOSIS — F17200 Nicotine dependence, unspecified, uncomplicated: Secondary | ICD-10-CM

## 2013-02-17 DIAGNOSIS — Z8673 Personal history of transient ischemic attack (TIA), and cerebral infarction without residual deficits: Secondary | ICD-10-CM

## 2013-02-17 DIAGNOSIS — R410 Disorientation, unspecified: Secondary | ICD-10-CM

## 2013-02-17 DIAGNOSIS — R4189 Other symptoms and signs involving cognitive functions and awareness: Secondary | ICD-10-CM

## 2013-02-17 DIAGNOSIS — I10 Essential (primary) hypertension: Secondary | ICD-10-CM

## 2013-02-17 DIAGNOSIS — J9600 Acute respiratory failure, unspecified whether with hypoxia or hypercapnia: Secondary | ICD-10-CM

## 2013-02-17 DIAGNOSIS — F3289 Other specified depressive episodes: Secondary | ICD-10-CM | POA: Diagnosis present

## 2013-02-17 DIAGNOSIS — F0390 Unspecified dementia without behavioral disturbance: Secondary | ICD-10-CM | POA: Diagnosis present

## 2013-02-17 DIAGNOSIS — N183 Chronic kidney disease, stage 3 unspecified: Secondary | ICD-10-CM | POA: Diagnosis present

## 2013-02-17 DIAGNOSIS — N390 Urinary tract infection, site not specified: Principal | ICD-10-CM | POA: Diagnosis present

## 2013-02-17 DIAGNOSIS — D72829 Elevated white blood cell count, unspecified: Secondary | ICD-10-CM | POA: Diagnosis present

## 2013-02-17 DIAGNOSIS — Z79899 Other long term (current) drug therapy: Secondary | ICD-10-CM

## 2013-02-17 DIAGNOSIS — Z9181 History of falling: Secondary | ICD-10-CM

## 2013-02-17 DIAGNOSIS — R509 Fever, unspecified: Secondary | ICD-10-CM | POA: Diagnosis present

## 2013-02-17 DIAGNOSIS — Z87891 Personal history of nicotine dependence: Secondary | ICD-10-CM

## 2013-02-17 DIAGNOSIS — J4489 Other specified chronic obstructive pulmonary disease: Secondary | ICD-10-CM | POA: Diagnosis present

## 2013-02-17 LAB — URINALYSIS, ROUTINE W REFLEX MICROSCOPIC
Bilirubin Urine: NEGATIVE
Glucose, UA: NEGATIVE mg/dL
Hgb urine dipstick: NEGATIVE
Nitrite: NEGATIVE
Specific Gravity, Urine: 1.019 (ref 1.005–1.030)
pH: 5 (ref 5.0–8.0)

## 2013-02-17 LAB — COMPREHENSIVE METABOLIC PANEL
ALT: 10 U/L (ref 0–53)
AST: 10 U/L (ref 0–37)
Albumin: 3.1 g/dL — ABNORMAL LOW (ref 3.5–5.2)
Calcium: 9.5 mg/dL (ref 8.4–10.5)
GFR calc Af Amer: 33 mL/min — ABNORMAL LOW (ref >90)
Sodium: 140 mEq/L (ref 135–145)
Total Protein: 7 g/dL (ref 6.0–8.3)

## 2013-02-17 LAB — CBC WITH DIFFERENTIAL/PLATELET
Basophils Absolute: 0 10*3/uL (ref 0.0–0.1)
Basophils Relative: 0 % (ref 0–1)
Eosinophils Absolute: 0.3 10*3/uL (ref 0.0–0.7)
Eosinophils Relative: 4 % (ref 0–5)
MCH: 27.3 pg (ref 26.0–34.0)
MCV: 88.1 fL (ref 78.0–100.0)
Platelets: 173 10*3/uL (ref 150–400)
RDW: 14.9 % (ref 11.5–15.5)

## 2013-02-17 LAB — URINE MICROSCOPIC-ADD ON

## 2013-02-17 MED ORDER — RIVASTIGMINE 4.6 MG/24HR TD PT24
4.6000 mg | MEDICATED_PATCH | Freq: Every day | TRANSDERMAL | Status: DC
  Administered 2013-02-17 – 2013-02-20 (×4): 4.6 mg via TRANSDERMAL
  Filled 2013-02-17 (×4): qty 1

## 2013-02-17 MED ORDER — CLOPIDOGREL BISULFATE 75 MG PO TABS
75.0000 mg | ORAL_TABLET | Freq: Every evening | ORAL | Status: DC
  Administered 2013-02-19: 75 mg via ORAL
  Filled 2013-02-17 (×4): qty 1

## 2013-02-17 MED ORDER — CLONAZEPAM 0.5 MG PO TABS
0.5000 mg | ORAL_TABLET | Freq: Two times a day (BID) | ORAL | Status: DC | PRN
  Administered 2013-02-17: 0.5 mg via ORAL
  Filled 2013-02-17: qty 1

## 2013-02-17 MED ORDER — TAMSULOSIN HCL 0.4 MG PO CAPS
0.4000 mg | ORAL_CAPSULE | Freq: Every day | ORAL | Status: DC
  Administered 2013-02-18 – 2013-02-20 (×3): 0.4 mg via ORAL
  Filled 2013-02-17 (×3): qty 1

## 2013-02-17 MED ORDER — MEMANTINE HCL ER 28 MG PO CP24: 28.0000 mg | ORAL_CAPSULE | Freq: Every evening | ORAL | Status: DC

## 2013-02-17 MED ORDER — RISPERIDONE 0.5 MG PO TABS
0.5000 mg | ORAL_TABLET | Freq: Every day | ORAL | Status: DC
  Administered 2013-02-19 (×2): 0.5 mg via ORAL
  Filled 2013-02-17 (×4): qty 1

## 2013-02-17 MED ORDER — FEBUXOSTAT 40 MG PO TABS
80.0000 mg | ORAL_TABLET | Freq: Every morning | ORAL | Status: DC
  Administered 2013-02-17 – 2013-02-20 (×4): 80 mg via ORAL
  Filled 2013-02-17 (×6): qty 2

## 2013-02-17 MED ORDER — ALBUTEROL SULFATE HFA 108 (90 BASE) MCG/ACT IN AERS
2.0000 | INHALATION_SPRAY | Freq: Four times a day (QID) | RESPIRATORY_TRACT | Status: DC | PRN
  Filled 2013-02-17: qty 6.7

## 2013-02-17 MED ORDER — AMLODIPINE BESYLATE 10 MG PO TABS
10.0000 mg | ORAL_TABLET | Freq: Every morning | ORAL | Status: DC
  Administered 2013-02-17 – 2013-02-20 (×4): 10 mg via ORAL
  Filled 2013-02-17 (×4): qty 1

## 2013-02-17 MED ORDER — RISPERIDONE 0.5 MG PO TABS
0.5000 mg | ORAL_TABLET | Freq: Every day | ORAL | Status: DC
  Administered 2013-02-18 – 2013-02-20 (×3): 0.5 mg via ORAL
  Filled 2013-02-17 (×3): qty 1

## 2013-02-17 MED ORDER — TAMSULOSIN HCL 0.4 MG PO CAPS: 0.4000 mg | ORAL_CAPSULE | Freq: Every morning | ORAL | Status: DC

## 2013-02-17 MED ORDER — PAROXETINE HCL 20 MG PO TABS
40.0000 mg | ORAL_TABLET | Freq: Every day | ORAL | Status: DC
  Administered 2013-02-17 – 2013-02-20 (×4): 40 mg via ORAL
  Filled 2013-02-17 (×4): qty 2

## 2013-02-17 MED ORDER — ENOXAPARIN SODIUM 30 MG/0.3ML ~~LOC~~ SOLN
30.0000 mg | SUBCUTANEOUS | Status: DC
  Administered 2013-02-19: 30 mg via SUBCUTANEOUS
  Filled 2013-02-17 (×4): qty 0.3

## 2013-02-17 MED ORDER — HYDROCHLOROTHIAZIDE 12.5 MG PO CAPS
12.5000 mg | ORAL_CAPSULE | Freq: Every morning | ORAL | Status: DC
  Administered 2013-02-17 – 2013-02-20 (×4): 12.5 mg via ORAL
  Filled 2013-02-17 (×4): qty 1

## 2013-02-17 MED ORDER — IPRATROPIUM-ALBUTEROL 20-100 MCG/ACT IN AERS
2.0000 | INHALATION_SPRAY | Freq: Three times a day (TID) | RESPIRATORY_TRACT | Status: DC
  Administered 2013-02-17 – 2013-02-19 (×8): 2 via RESPIRATORY_TRACT
  Filled 2013-02-17: qty 4

## 2013-02-17 MED ORDER — TRAZODONE HCL 50 MG PO TABS
50.0000 mg | ORAL_TABLET | Freq: Every day | ORAL | Status: DC
  Administered 2013-02-19: 50 mg via ORAL
  Filled 2013-02-17 (×4): qty 1

## 2013-02-17 NOTE — ED Notes (Signed)
Attempted to call report to Glyn Ade, RN, unable to take report, will call back in 10 minutes.

## 2013-02-17 NOTE — ED Notes (Signed)
WRU:[id number]<UJ> Expected date:<BR> Expected time:<BR> Means of arrival:<BR> Comments:<BR> EMS/80 yo with weakness

## 2013-02-17 NOTE — ED Provider Notes (Signed)
History     CSN: [id number]  Arrival date & time 02/17/13  0404   First MD Initiated Contact with Patient 02/17/13 [phone]      Chief Complaint  Patient presents with  . Weakness    son unable to care for pt   . Altered Mental Status    (Consider location/radiation/quality/duration/timing/severity/associated sxs/prior treatment) HPI Comments: Per EMS report, and phone call to family patient has been falling more frequently than normal.  He is been, altered.  He's not eating well.  He is unable to care for himself, and the family feels they are unable to care for him as well.  Per documentation.  they have been calling [facility] for assist in getting the patient placed in a nursing home for the past month.  The son states, that he has been, eating.  He's been wandering at night, having unwitnessed falls  Patient is a 79 y.o. male presenting with weakness and altered mental status. The history is provided by a relative and the EMS personnel. The history is limited by the absence of a caregiver.  Weakness This is a chronic problem. The problem occurs constantly. The problem has been gradually worsening. Associated symptoms include weakness. Pertinent negatives include no fever. The treatment provided no relief.  Altered Mental Status Associated symptoms: weakness   Associated symptoms: no fever     Past Medical History  Diagnosis Date  . Back pain     DJD  . Hypertension   . Arteriosclerotic cardiovascular disease (ASCVD)     stent to RCA 01/30/96 and 2003-01-30  . COPD (chronic obstructive pulmonary disease)   . GERD (gastroesophageal reflux disease)   . Headache(784.0)     chronic  . Chronic renal insufficiency     baseline Creatinine-1.4  . Anxiety and depression 1996    Chronic since wife's death in 30-Jan-1995  . Cognitive decline 2010-01-29    progressive since 2010/01/29, question of dementia  . Hyperlipidemia   . Cholelithiasis     Incidentally noted  . Heart attack     x2  .  Stroke July 2013    Past Surgical History  Procedure Laterality Date  . Coronary stent placement  1997    RCA  . Coronary stent placement  Jan 30, 2003    in-stent restenosis, stent replaced RCA.    . Colonoscopy  10/11/2012    Procedure: COLONOSCOPY;  Surgeon: Malissa Hippo, MD;  Location: [facility]O SUITE;  Service: Endoscopy;  Laterality: N/A;  100    Family History  Problem Relation Age of Onset  . Throat cancer Father     Smoker  . Heart disease Brother   . Asthma Son   . Heart disease Brother     History  Substance Use Topics  . Smoking status: Former Smoker -- 1.00 packs/day for 50 years    Types: Cigarettes    Quit date: 03/24/2012  . Smokeless tobacco: Never Used  . Alcohol Use: No      Review of Systems  Unable to perform ROS: Dementia  Constitutional: Negative for fever.  Neurological: Positive for weakness.  Psychiatric/Behavioral: Positive for altered mental status.  All other systems reviewed and are negative.    Allergies  Statins  Home Medications   Current Outpatient Rx  Name  Route  Sig  Dispense  Refill  . albuterol (PROVENTIL HFA;VENTOLIN HFA) 108 (90 BASE) MCG/ACT inhaler   Inhalation   Inhale 2 puffs into the lungs every 6 (six) hours as  needed for wheezing.   1 Inhaler   2   . amLODipine (NORVASC) 10 MG tablet   Oral   Take 10 mg by mouth every morning.          . clonazePAM (KLONOPIN) 0.5 MG tablet   Oral   Take 1 tablet (0.5 mg total) by mouth 2 (two) times daily as needed for anxiety.   30 tablet   0   . clopidogrel (PLAVIX) 75 MG tablet   Oral   Take 75 mg by mouth every evening.          . Febuxostat (ULORIC) 80 MG TABS   Oral   Take 80 mg by mouth every morning.          . hydrochlorothiazide (MICROZIDE) 12.5 MG capsule   Oral   Take 12.5 mg by mouth every morning.         . Ipratropium-Albuterol (COMBIVENT) 20-100 MCG/ACT AERS respimat   Inhalation   Inhale 2 puffs into the lungs 3 (three) times daily.          . Memantine HCl ER (NAMENDA XR) 28 MG CP24   Oral   Take 1 tablet by mouth every evening.         Marland Kitchen PARoxetine (PAXIL) 40 MG tablet   Oral   Take 40 mg by mouth every morning.         . risperiDONE (RISPERDAL) 0.5 MG tablet   Oral   Take 0.5 mg by mouth at bedtime.         . rivastigmine (EXELON) 4.6 mg/24hr   Transdermal   Place 1 patch onto the skin daily.         . tamsulosin (FLOMAX) 0.4 MG CAPS   Oral   Take 0.4 mg by mouth every morning.         . traZODone (DESYREL) 50 MG tablet   Oral   Take 50 mg by mouth at bedtime.           BP 129/67  Pulse 67  Temp(Src) 97.8 F (36.6 C) (Oral)  Resp 18  SpO2 95%  Physical Exam  Constitutional: He appears well-developed and well-nourished. No distress.  HENT:  Head: Normocephalic and atraumatic.  Right Ear: External ear normal.  Left Ear: External ear normal.  Mouth/Throat: Oropharynx is clear and moist.  Eyes: Pupils are equal, round, and reactive to light.  Neck: Normal range of motion.  Cardiovascular: Normal rate and regular rhythm.   Pulmonary/Chest: Effort normal and breath sounds normal.  Abdominal: Soft. Bowel sounds are normal. He exhibits no distension. There is no tenderness.  Musculoskeletal: He exhibits edema.  Neurological: He is alert.  Skin: Skin is warm and dry. No rash noted. There is pallor.    ED Course  Procedures (including critical care time)  Labs Reviewed  CBC WITH DIFFERENTIAL  COMPREHENSIVE METABOLIC PANEL  URINALYSIS, ROUTINE W REFLEX MICROSCOPIC   No results found.   No diagnosis found.    MDM          Arman Filter, NP 02/18/13 2001

## 2013-02-17 NOTE — H&P (Signed)
[facility] History and Physical  Terry STURGES Sr. JYN:[id number] DOB: 1933-02-17 DOA: 02/17/2013  Referring physician: ED physician PCP: [name], MD   Chief Complaint: Falls  HPI:  Pt is 79 yo male with history of dementia, brought in to [facility] ED after sustaining several episodes of fall at home. PT has advanced dementia and is unable to provide details. No family at bedside to obtain any additional information. Pt denies fevers, chill, shortness of breath, no abdominal or urinary concerns.   Assessment and Plan:  Frequent falls at home - will admit pt to medical floor as this is likely secondary to progressive deconditioning and failure to thrive - will continue home medications for now, vitals are stable - will obtain PT evaluation for further recommendations on appropriate discharge plan Chronic renal failure - creatinine is actually at pt's baseline - BMP in AM - I have encouraged oral intake   Code Status: Full Family Communication: Pt at bedside Disposition Plan: Admit to medical floor    Review of Systems:  Unable to provide due to dementia    Past Medical History  Diagnosis Date  . Back pain     DJD  . Hypertension   . Arteriosclerotic cardiovascular disease (ASCVD)     stent to RCA 27-Feb-1996 and Feb 27, 2003  . COPD (chronic obstructive pulmonary disease)   . GERD (gastroesophageal reflux disease)   . Headache(784.0)     chronic  . Chronic renal insufficiency     baseline Creatinine-1.4  . Anxiety and depression 1996    Chronic since wife's death in 27-Feb-1995  . Cognitive decline 02-26-10    progressive since 2010/02/26, question of dementia  . Hyperlipidemia   . Cholelithiasis     Incidentally noted  . Heart attack     x2  . Stroke July 2013    Past Surgical History  Procedure Laterality Date  . Coronary stent placement  1997    RCA  . Coronary stent placement  02/27/03    in-stent restenosis, stent replaced RCA.    . Colonoscopy  10/11/2012    Procedure:  COLONOSCOPY;  Surgeon: Malissa Hippo, MD;  Location: [facility];  Service: Endoscopy;  Laterality: N/A;  100    Social History:  reports that he quit smoking about 10 months ago. His smoking use included Cigarettes. He has a 50 pack-year smoking history. He has never used smokeless tobacco. He reports that he does not drink alcohol or use illicit drugs.  Allergies  Allergen Reactions  . Statins Other (See Comments)    REACTION: MYALGIA    Family History  Problem Relation Age of Onset  . Throat cancer Father     Smoker  . Heart disease Brother   . Asthma Son   . Heart disease Brother     Prior to Admission medications   Medication Sig Start Date End Date Taking? Authorizing Provider  albuterol (PROVENTIL HFA;VENTOLIN HFA) 108 (90 BASE) MCG/ACT inhaler Inhale 2 puffs into the lungs every 6 (six) hours as needed for wheezing. 01/16/13  Yes Hollice Espy, MD  amLODipine (NORVASC) 10 MG tablet Take 10 mg by mouth every morning.    Yes Historical Provider, MD  clonazePAM (KLONOPIN) 0.5 MG tablet Take 1 tablet (0.5 mg total) by mouth 2 (two) times daily as needed for anxiety. 01/16/13  Yes Hollice Espy, MD  clopidogrel (PLAVIX) 75 MG tablet Take 75 mg by mouth every evening.    Yes Historical Provider, MD  Febuxostat (  ULORIC) 80 MG TABS Take 80 mg by mouth every morning.    Yes Historical Provider, MD  hydrochlorothiazide (MICROZIDE) 12.5 MG capsule Take 12.5 mg by mouth every morning.   Yes Historical Provider, MD  Ipratropium-Albuterol (COMBIVENT) 20-100 MCG/ACT AERS respimat Inhale 2 puffs into the lungs 3 (three) times daily. 05/16/12  Yes Kathlen Brunswick, MD  Memantine HCl ER (NAMENDA XR) 28 MG CP24 Take 1 tablet by mouth every evening.   Yes Historical Provider, MD  PARoxetine (PAXIL) 40 MG tablet Take 40 mg by mouth every morning.   Yes Historical Provider, MD  risperiDONE (RISPERDAL) 0.5 MG tablet Take 0.5 mg by mouth at bedtime. 08/29/12  Yes Meredeth Ide, MD   rivastigmine (EXELON) 4.6 mg/24hr Place 1 patch onto the skin daily.   Yes Historical Provider, MD  tamsulosin (FLOMAX) 0.4 MG CAPS Take 0.4 mg by mouth every morning. 03/28/12  Yes Christina P Rama, MD  traZODone (DESYREL) 50 MG tablet Take 50 mg by mouth at bedtime.   Yes Historical Provider, MD    Physical Exam: Filed Vitals:   02/17/13 4098 02/17/13 0736 02/17/13 1025 02/17/13 1142  BP: 121/62 116/69  124/67  Pulse: 66 62    Temp:    97.6 F (36.4 C)  TempSrc:    Oral  Resp: 18 18 18 14   SpO2: 92% 94% 96% 97%    Physical Exam  Constitutional: Appears well-developed and well-nourished. No distress. Confused HENT: Normocephalic. External right and left ear normal. Dry MM Eyes: Conjunctivae and EOM are normal. PERRLA, no scleral icterus.  Neck: Normal ROM. Neck supple. No JVD. No tracheal deviation. No thyromegaly.  CVS: RRR, S1/S2 +, no murmurs, no gallops, no carotid bruit.  Pulmonary: Effort and breath sounds normal, no stridor, scattered rhonchi bilaterally .  Abdominal: Soft. BS +,  no distension, tenderness, rebound or guarding.  Musculoskeletal: Normal range of motion. No edema and no tenderness.  Lymphadenopathy: No lymphadenopathy noted, cervical, inguinal. Neuro: Alert. Oriented to name only, overall non focal exam, patient follows commands appropriately  Skin: Skin is warm and dry. No rash noted. Not diaphoretic. No erythema. No pallor.  Psychiatric: Normal mood and affect. Behavior, judgment, thought content normal.   Labs on Admission:  Basic Metabolic Panel:  Recent Labs Lab 02/17/13 0515  NA 140  K 3.7  CL 103  CO2 30  GLUCOSE 104*  BUN 25*  CREATININE 2.11*  CALCIUM 9.5   Liver Function Tests:  Recent Labs Lab 02/17/13 0515  AST 10  ALT 10  ALKPHOS 84  BILITOT 0.2*  PROT 7.0  ALBUMIN 3.1*   No results found for this basename: LIPASE, AMYLASE,  in the last 168 hours No results found for this basename: AMMONIA,  in the last 168  hours CBC:  Recent Labs Lab 02/17/13 0515  WBC 8.0  NEUTROABS 5.0  HGB 11.2*  HCT 36.2*  MCV 88.1  PLT 173   Radiological Exams on Admission: Dg Chest 2 View  02/17/2013   *RADIOLOGY REPORT* the  Clinical Data: Altered mental status.  COPD.  Hypertension.  CHEST - 2 VIEW  Comparison: 01/12/2013  Findings: The cardiac silhouette is mildly enlarged.  A coronary artery stent is noted on the lateral view.  No mediastinal or hilar masses.  There is interstitial thickening that is stable, and central vascular prominence without overt pulmonary edema, also stable.  No evidence of an infiltrate.  No pleural effusion or pneumothorax.  The bony thorax is intact.  IMPRESSION:  No acute cardiopulmonary disease.  Stable appearance from the prior study.   Original Report Authenticated By: Amie Portland, M.D.   Ct Head Wo Contrast  02/17/2013   *RADIOLOGY REPORT*  Clinical Data:  Altered mental status, fall  CT HEAD WITHOUT CONTRAST CT CERVICAL SPINE WITHOUT CONTRAST  Technique:  Multidetector CT imaging of the head and cervical spine was performed following the standard protocol without intravenous contrast.  Multiplanar CT image reconstructions of the cervical spine were also generated.  Comparison:  08/27/2012 head CT  CT HEAD  Findings: Prominence of the sulci, cisterns, and ventricles, in keeping with volume loss. There are subcortical and periventricular white matter hypodensities, a nonspecific finding most often seen with chronic microangiopathic changes.  There is no evidence for acute hemorrhage, overt hydrocephalus, mass lesion, or abnormal extra-axial fluid collection.  No definite CT evidence for acute cortical based (large artery) infarction. Scattered atherosclerotic disease.  Opacified right maxillary sinus and partially opacified ethmoid air cells.  The visualized paranasal sinuses and mastoid air cells are otherwise predominately clear. No displaced calvarial fracture.  IMPRESSION: Volume loss  white matter changes are similar to prior.  No CT evidence for acute intracranial abnormality.  Partially opacified paranasal sinuses.  Correlate clinically for acute sinusitis.  CT CERVICAL SPINE  Findings: Biapical scarring.  Indeterminate left thyroid lobe nodule measures approximately 1 cm.  Atherosclerotic vascular calcifications.  Maintained craniocervical relationship.  No dens fracture.  Degenerative disc disease is most pronounced at C5-6, with disc osteophyte complex resulting in mild central canal narrowing. Minimal retrolisthesis of C5 and C6 and anterolisthesis of C6 on C7.  No displaced acute fracture or malalignment. Paravertebral soft tissues within normal range.  IMPRESSION: C5-6 degenerative changes without acute osseous finding.  1 cm left thyroid lobe nodule can be further evaluated with a non emergent ultrasound follow-up.   Original Report Authenticated By: Jearld Lesch, M.D.   Ct Cervical Spine Wo Contrast  02/17/2013   *RADIOLOGY REPORT*  Clinical Data:  Altered mental status, fall  CT HEAD WITHOUT CONTRAST CT CERVICAL SPINE WITHOUT CONTRAST  Technique:  Multidetector CT imaging of the head and cervical spine was performed following the standard protocol without intravenous contrast.  Multiplanar CT image reconstructions of the cervical spine were also generated.  Comparison:  08/27/2012 head CT  CT HEAD  Findings: Prominence of the sulci, cisterns, and ventricles, in keeping with volume loss. There are subcortical and periventricular white matter hypodensities, a nonspecific finding most often seen with chronic microangiopathic changes.  There is no evidence for acute hemorrhage, overt hydrocephalus, mass lesion, or abnormal extra-axial fluid collection.  No definite CT evidence for acute cortical based (large artery) infarction. Scattered atherosclerotic disease.  Opacified right maxillary sinus and partially opacified ethmoid air cells.  The visualized paranasal sinuses and mastoid  air cells are otherwise predominately clear. No displaced calvarial fracture.  IMPRESSION: Volume loss white matter changes are similar to prior.  No CT evidence for acute intracranial abnormality.  Partially opacified paranasal sinuses.  Correlate clinically for acute sinusitis.  CT CERVICAL SPINE  Findings: Biapical scarring.  Indeterminate left thyroid lobe nodule measures approximately 1 cm.  Atherosclerotic vascular calcifications.  Maintained craniocervical relationship.  No dens fracture.  Degenerative disc disease is most pronounced at C5-6, with disc osteophyte complex resulting in mild central canal narrowing. Minimal retrolisthesis of C5 and C6 and anterolisthesis of C6 on C7.  No displaced acute fracture or malalignment. Paravertebral soft tissues within normal range.  IMPRESSION: C5-6 degenerative changes  without acute osseous finding.  1 cm left thyroid lobe nodule can be further evaluated with a non emergent ultrasound follow-up.   Original Report Authenticated By: Jearld Lesch, M.D.   EKG: Normal sinus rhythm, no ST/T wave changes  Debbora Presto, MD  [facility] Pager [phone]  If 7PM-7AM, please contact night-coverage [email or web address] Password [facility] 02/17/2013, 11:43 AM

## 2013-02-17 NOTE — ED Notes (Signed)
Pt transferred to 1503 accompanied by Joylene Draft with chart and personal belongings. Condition stable at time of transfer.

## 2013-02-17 NOTE — ED Notes (Signed)
Pt's son, Orpah Greek can be contacted at [phone].

## 2013-02-17 NOTE — ED Notes (Signed)
Pt brought in per EMS, due to the son not being able to care for him any longer. Per EMS the son states that the pt has been having frequent falls at home and does not sleep at night. [name], the pt's son was called upon arrival to the ED and states that EMS was called due to the pt having increased weakness and confusion. Pt's son also states that the pt has been dehydrated and not eating. Pt fell on 5/30 at home, which was un witnessed by the son. Pt's son would like for the pt to go to Rehab instead of coming back home.

## 2013-02-17 NOTE — ED Provider Notes (Signed)
6:07 AM Sign out received from Earley Favor, NP, at change of shift.  Pt is elderly man with severe dementia, usually lives at home with family.  Pt has been wandering at night, falling frequently, not eating or drinking.  Last fell two days ago.  Plan is for imaging, labs, admit to medicine vs placement by social work.  Pt will ultimately need placement as family is unable to care for him at home any longer.   7:59 AM Pt sleeping soundly.  11:47 AM Pt admitted to hospitalist for increased confusion, anxiety, weakness, increased falls (3 x this week).  Will ultimately need placement.    Results for orders placed during the hospital encounter of 02/17/13  CBC WITH DIFFERENTIAL      Result Value Range   WBC 8.0  4.0 - 10.5 K/uL   RBC 4.11 (*) 4.22 - 5.81 MIL/uL   Hemoglobin 11.2 (*) 13.0 - 17.0 g/dL   HCT 91.4 (*) 78.2 - 95.6 %   MCV 88.1  78.0 - 100.0 fL   MCH 27.3  26.0 - 34.0 pg   MCHC 30.9  30.0 - 36.0 g/dL   RDW 21.3  08.6 - 57.8 %   Platelets 173  150 - 400 K/uL   Neutrophils Relative % 63  43 - 77 %   Neutro Abs 5.0  1.7 - 7.7 K/uL   Lymphocytes Relative 23  12 - 46 %   Lymphs Abs 1.8  0.7 - 4.0 K/uL   Monocytes Relative 10  3 - 12 %   Monocytes Absolute 0.8  0.1 - 1.0 K/uL   Eosinophils Relative 4  0 - 5 %   Eosinophils Absolute 0.3  0.0 - 0.7 K/uL   Basophils Relative 0  0 - 1 %   Basophils Absolute 0.0  0.0 - 0.1 K/uL  COMPREHENSIVE METABOLIC PANEL      Result Value Range   Sodium 140  135 - 145 mEq/L   Potassium 3.7  3.5 - 5.1 mEq/L   Chloride 103  96 - 112 mEq/L   CO2 30  19 - 32 mEq/L   Glucose, Bld 104 (*) 70 - 99 mg/dL   BUN 25 (*) 6 - 23 mg/dL   Creatinine, Ser 4.69 (*) 0.50 - 1.35 mg/dL   Calcium 9.5  8.4 - 62.9 mg/dL   Total Protein 7.0  6.0 - 8.3 g/dL   Albumin 3.1 (*) 3.5 - 5.2 g/dL   AST 10  0 - 37 U/L   ALT 10  0 - 53 U/L   Alkaline Phosphatase 84  39 - 117 U/L   Total Bilirubin 0.2 (*) 0.3 - 1.2 mg/dL   GFR calc non Af Amer 28 (*) >90 mL/min   GFR calc  Af Amer 33 (*) >90 mL/min  URINALYSIS, ROUTINE W REFLEX MICROSCOPIC      Result Value Range   Color, Urine YELLOW  YELLOW   APPearance CLEAR  CLEAR   Specific Gravity, Urine 1.019  1.005 - 1.030   pH 5.0  5.0 - 8.0   Glucose, UA NEGATIVE  NEGATIVE mg/dL   Hgb urine dipstick NEGATIVE  NEGATIVE   Bilirubin Urine NEGATIVE  NEGATIVE   Ketones, ur NEGATIVE  NEGATIVE mg/dL   Protein, ur NEGATIVE  NEGATIVE mg/dL   Urobilinogen, UA 1.0  0.0 - 1.0 mg/dL   Nitrite NEGATIVE  NEGATIVE   Leukocytes, UA TRACE (*) NEGATIVE  URINE MICROSCOPIC-ADD ON      Result Value Range  Squamous Epithelial / LPF RARE  RARE   WBC, UA 0-2  <3 WBC/hpf   RBC / HPF 3-6  <3 RBC/hpf   Bacteria, UA FEW (*) RARE   Urine-Other MUCOUS PRESENT     Dg Chest 2 View  02/17/2013   *RADIOLOGY REPORT* the  Clinical Data: Altered mental status.  COPD.  Hypertension.  CHEST - 2 VIEW  Comparison: 01/12/2013  Findings: The cardiac silhouette is mildly enlarged.  A coronary artery stent is noted on the lateral view.  No mediastinal or hilar masses.  There is interstitial thickening that is stable, and central vascular prominence without overt pulmonary edema, also stable.  No evidence of an infiltrate.  No pleural effusion or pneumothorax.  The bony thorax is intact.  IMPRESSION: No acute cardiopulmonary disease.  Stable appearance from the prior study.   Original Report Authenticated By: Amie Portland, M.D.   Ct Head Wo Contrast  02/17/2013   *RADIOLOGY REPORT*  Clinical Data:  Altered mental status, fall  CT HEAD WITHOUT CONTRAST CT CERVICAL SPINE WITHOUT CONTRAST  Technique:  Multidetector CT imaging of the head and cervical spine was performed following the standard protocol without intravenous contrast.  Multiplanar CT image reconstructions of the cervical spine were also generated.  Comparison:  08/27/2012 head CT  CT HEAD  Findings: Prominence of the sulci, cisterns, and ventricles, in keeping with volume loss. There are subcortical  and periventricular white matter hypodensities, a nonspecific finding most often seen with chronic microangiopathic changes.  There is no evidence for acute hemorrhage, overt hydrocephalus, mass lesion, or abnormal extra-axial fluid collection.  No definite CT evidence for acute cortical based (large artery) infarction. Scattered atherosclerotic disease.  Opacified right maxillary sinus and partially opacified ethmoid air cells.  The visualized paranasal sinuses and mastoid air cells are otherwise predominately clear. No displaced calvarial fracture.  IMPRESSION: Volume loss white matter changes are similar to prior.  No CT evidence for acute intracranial abnormality.  Partially opacified paranasal sinuses.  Correlate clinically for acute sinusitis.  CT CERVICAL SPINE  Findings: Biapical scarring.  Indeterminate left thyroid lobe nodule measures approximately 1 cm.  Atherosclerotic vascular calcifications.  Maintained craniocervical relationship.  No dens fracture.  Degenerative disc disease is most pronounced at C5-6, with disc osteophyte complex resulting in mild central canal narrowing. Minimal retrolisthesis of C5 and C6 and anterolisthesis of C6 on C7.  No displaced acute fracture or malalignment. Paravertebral soft tissues within normal range.  IMPRESSION: C5-6 degenerative changes without acute osseous finding.  1 cm left thyroid lobe nodule can be further evaluated with a non emergent ultrasound follow-up.   Original Report Authenticated By: Jearld Lesch, M.D.   Ct Cervical Spine Wo Contrast  02/17/2013   *RADIOLOGY REPORT*  Clinical Data:  Altered mental status, fall  CT HEAD WITHOUT CONTRAST CT CERVICAL SPINE WITHOUT CONTRAST  Technique:  Multidetector CT imaging of the head and cervical spine was performed following the standard protocol without intravenous contrast.  Multiplanar CT image reconstructions of the cervical spine were also generated.  Comparison:  08/27/2012 head CT  CT HEAD  Findings:  Prominence of the sulci, cisterns, and ventricles, in keeping with volume loss. There are subcortical and periventricular white matter hypodensities, a nonspecific finding most often seen with chronic microangiopathic changes.  There is no evidence for acute hemorrhage, overt hydrocephalus, mass lesion, or abnormal extra-axial fluid collection.  No definite CT evidence for acute cortical based (large artery) infarction. Scattered atherosclerotic disease.  Opacified right maxillary sinus  and partially opacified ethmoid air cells.  The visualized paranasal sinuses and mastoid air cells are otherwise predominately clear. No displaced calvarial fracture.  IMPRESSION: Volume loss white matter changes are similar to prior.  No CT evidence for acute intracranial abnormality.  Partially opacified paranasal sinuses.  Correlate clinically for acute sinusitis.  CT CERVICAL SPINE  Findings: Biapical scarring.  Indeterminate left thyroid lobe nodule measures approximately 1 cm.  Atherosclerotic vascular calcifications.  Maintained craniocervical relationship.  No dens fracture.  Degenerative disc disease is most pronounced at C5-6, with disc osteophyte complex resulting in mild central canal narrowing. Minimal retrolisthesis of C5 and C6 and anterolisthesis of C6 on C7.  No displaced acute fracture or malalignment. Paravertebral soft tissues within normal range.  IMPRESSION: C5-6 degenerative changes without acute osseous finding.  1 cm left thyroid lobe nodule can be further evaluated with a non emergent ultrasound follow-up.   Original Report Authenticated By: Jearld Lesch, M.D.      Trixie Dredge, PA-C 02/17/13 1148

## 2013-02-17 NOTE — ED Notes (Signed)
Pt transferred.

## 2013-02-18 ENCOUNTER — Observation Stay (HOSPITAL_COMMUNITY): Payer: Medicare Other

## 2013-02-18 LAB — CBC
HCT: 36.7 % — ABNORMAL LOW (ref 39.0–52.0)
Hemoglobin: 11.3 g/dL — ABNORMAL LOW (ref 13.0–17.0)
MCH: 27.3 pg (ref 26.0–34.0)
MCHC: 30.8 g/dL (ref 30.0–36.0)
MCV: 88.6 fL (ref 78.0–100.0)
RBC: 4.14 MIL/uL — ABNORMAL LOW (ref 4.22–5.81)

## 2013-02-18 LAB — BASIC METABOLIC PANEL
BUN: 21 mg/dL (ref 6–23)
CO2: 31 mEq/L (ref 19–32)
GFR calc non Af Amer: 32 mL/min — ABNORMAL LOW (ref >90)
Glucose, Bld: 95 mg/dL (ref 70–99)
Potassium: 3.8 mEq/L (ref 3.5–5.1)

## 2013-02-18 NOTE — Progress Notes (Signed)
Per Unit Secretary, Pt's family visited yesterday shortly after Pt was admitted.  Per other Unit RN, Pt's son called the unit yesterday evening asking about his father.  T/c from [facility] non-emergent dispatch stating that an officer went to the residence and no one was home.  The officer left his card.  [name], LCSWA Clinical Social Work [phone]

## 2013-02-18 NOTE — Discharge Summary (Addendum)
Physician Discharge Summary  Terry Wampole Iribe Sr. ZOX:[id number] DOB: 22-Feb-1933 DOA: 02/17/2013  PCP: [name], MD  Admit date: 02/17/2013 Discharge date: 02/20/2013  Time spent: 35 minutes  Recommendations for Outpatient Follow-up:  1. Follow up with PCP  Discharge Diagnoses:  Principal Problem:   UTI (urinary tract infection) Active Problems:   CKD (chronic kidney disease) stage 3, GFR 30-59 ml/min   Dementia   Fever, unspecified   Leukocytosis, unspecified   Discharge Condition: stable  Diet recommendation: regular   Filed Weights   02/17/13 1311 02/19/13 1244  Weight: 98.7 kg (217 lb 9.5 oz) 99.7 kg (219 lb 12.8 oz)    History of present illness:  Please refer to H&P for further details 79 yo male with history of dementia, brought in to [facility] ED after sustaining several episodes of fall at home. PT has advanced dementia and is unable to provide details. No family at bedside to obtain any additional information. Pt denies fevers, chill, shortness of breath, no abdominal or urinary concerns.    Hospital Course:  Frequent falls at home  - Admited as inpatient,pt consulted recommended SNF. Does not qualify for SNF due to lack of days. - Will continue home medications for now, vitals are stable, no fractures or dislocation on x-rays. -  PT for home.  *UTI (urinary tract infection)  - SPIKE temp overnight. Mild leukocytosis.   - start rocephin IV.  Defervesce, started cipro, which he will continue.  Dementia:  - stable.   Acute on CKD (chronic kidney disease) stage 3, GFR 30-59 ml/min  - stable.  - improved with IV fluids. - Cr return to baseline.   Procedures:  Ct head  Ct spine  cxr  Consultations:  none  Discharge Exam: Filed Vitals:   02/19/13 1405 02/19/13 1953 02/19/13 2046 02/20/13 0525  BP: 126/70  136/70 122/74  Pulse: 86  78 69  Temp: 98.2 F (36.8 C)  98.8 F (37.1 C) 98.1 F (36.7 C)  TempSrc: Oral  Oral Oral  Resp: 18  18 16    Height:      Weight:      SpO2: 94% 90% 90% 92%    General: A&O x3 Cardiovascular: RRR Respiratory: good air movement CTA B/L  Discharge Instructions      Discharge Orders   Future Appointments Provider Department Dept Phone   02/26/2013 2:00 PM Ernestina Penna, MD [facility] [phone]   Future Orders Complete By Expires     Diet - low sodium heart healthy  As directed     Diet - low sodium heart healthy  As directed     Increase activity slowly  As directed     Increase activity slowly  As directed         Medication List    TAKE these medications       albuterol 108 (90 BASE) MCG/ACT inhaler  Commonly known as:  PROVENTIL HFA;VENTOLIN HFA  Inhale 2 puffs into the lungs every 6 (six) hours as needed for wheezing.     amLODipine 10 MG tablet  Commonly known as:  NORVASC  Take 10 mg by mouth every morning.     ciprofloxacin 500 MG tablet  Commonly known as:  CIPRO  Take 1 tablet (500 mg total) by mouth 2 (two) times daily.     clonazePAM 0.5 MG tablet  Commonly known as:  KLONOPIN  Take 1 tablet (0.5 mg total) by mouth 2 (two) times daily as needed for  anxiety.     clopidogrel 75 MG tablet  Commonly known as:  PLAVIX  Take 75 mg by mouth every evening.     hydrochlorothiazide 12.5 MG capsule  Commonly known as:  MICROZIDE  Take 12.5 mg by mouth every morning.     Ipratropium-Albuterol 20-100 MCG/ACT Aers respimat  Commonly known as:  COMBIVENT  Inhale 2 puffs into the lungs 3 (three) times daily.     NAMENDA XR 28 MG Cp24  Generic drug:  Memantine HCl ER  Take 1 tablet by mouth every evening.     PARoxetine 40 MG tablet  Commonly known as:  PAXIL  Take 40 mg by mouth every morning.     risperiDONE 0.5 MG tablet  Commonly known as:  RISPERDAL  Take 0.5 mg by mouth at bedtime.     rivastigmine 4.6 mg/24hr  Commonly known as:  EXELON  Place 1 patch onto the skin daily.     tamsulosin 0.4 MG Caps  Commonly known as:  FLOMAX   Take 0.4 mg by mouth every morning.     traZODone 50 MG tablet  Commonly known as:  DESYREL  Take 50 mg by mouth at bedtime.     ULORIC 80 MG Tabs  Generic drug:  Febuxostat  Take 80 mg by mouth every morning.       Allergies  Allergen Reactions  . Statins Other (See Comments)    REACTION: MYALGIA   Follow-up Information   Follow up with [name], MD In 4 weeks. (hospital follow up)    Contact information:   [address] [place] [address] [phone]        The results of significant diagnostics from this hospitalization (including imaging, microbiology, ancillary and laboratory) are listed below for reference.    Significant Diagnostic Studies: Dg Chest 2 View  02/17/2013   *RADIOLOGY REPORT* the  Clinical Data: Altered mental status.  COPD.  Hypertension.  CHEST - 2 VIEW  Comparison: 01/12/2013  Findings: The cardiac silhouette is mildly enlarged.  A coronary artery stent is noted on the lateral view.  No mediastinal or hilar masses.  There is interstitial thickening that is stable, and central vascular prominence without overt pulmonary edema, also stable.  No evidence of an infiltrate.  No pleural effusion or pneumothorax.  The bony thorax is intact.  IMPRESSION: No acute cardiopulmonary disease.  Stable appearance from the prior study.   Original Report Authenticated By: Amie Portland, M.D.   Ct Head Wo Contrast  02/17/2013   *RADIOLOGY REPORT*  Clinical Data:  Altered mental status, fall  CT HEAD WITHOUT CONTRAST CT CERVICAL SPINE WITHOUT CONTRAST  Technique:  Multidetector CT imaging of the head and cervical spine was performed following the standard protocol without intravenous contrast.  Multiplanar CT image reconstructions of the cervical spine were also generated.  Comparison:  08/27/2012 head CT  CT HEAD  Findings: Prominence of the sulci, cisterns, and ventricles, in keeping with volume loss. There are subcortical and periventricular white matter  hypodensities, a nonspecific finding most often seen with chronic microangiopathic changes.  There is no evidence for acute hemorrhage, overt hydrocephalus, mass lesion, or abnormal extra-axial fluid collection.  No definite CT evidence for acute cortical based (large artery) infarction. Scattered atherosclerotic disease.  Opacified right maxillary sinus and partially opacified ethmoid air cells.  The visualized paranasal sinuses and mastoid air cells are otherwise predominately clear. No displaced calvarial fracture.  IMPRESSION: Volume loss white matter changes are similar to prior.  No CT evidence for acute intracranial abnormality.  Partially opacified paranasal sinuses.  Correlate clinically for acute sinusitis.  CT CERVICAL SPINE  Findings: Biapical scarring.  Indeterminate left thyroid lobe nodule measures approximately 1 cm.  Atherosclerotic vascular calcifications.  Maintained craniocervical relationship.  No dens fracture.  Degenerative disc disease is most pronounced at C5-6, with disc osteophyte complex resulting in mild central canal narrowing. Minimal retrolisthesis of C5 and C6 and anterolisthesis of C6 on C7.  No displaced acute fracture or malalignment. Paravertebral soft tissues within normal range.  IMPRESSION: C5-6 degenerative changes without acute osseous finding.  1 cm left thyroid lobe nodule can be further evaluated with a non emergent ultrasound follow-up.   Original Report Authenticated By: Jearld Lesch, M.D.   Ct Cervical Spine Wo Contrast  02/17/2013   *RADIOLOGY REPORT*  Clinical Data:  Altered mental status, fall  CT HEAD WITHOUT CONTRAST CT CERVICAL SPINE WITHOUT CONTRAST  Technique:  Multidetector CT imaging of the head and cervical spine was performed following the standard protocol without intravenous contrast.  Multiplanar CT image reconstructions of the cervical spine were also generated.  Comparison:  08/27/2012 head CT  CT HEAD  Findings: Prominence of the sulci,  cisterns, and ventricles, in keeping with volume loss. There are subcortical and periventricular white matter hypodensities, a nonspecific finding most often seen with chronic microangiopathic changes.  There is no evidence for acute hemorrhage, overt hydrocephalus, mass lesion, or abnormal extra-axial fluid collection.  No definite CT evidence for acute cortical based (large artery) infarction. Scattered atherosclerotic disease.  Opacified right maxillary sinus and partially opacified ethmoid air cells.  The visualized paranasal sinuses and mastoid air cells are otherwise predominately clear. No displaced calvarial fracture.  IMPRESSION: Volume loss white matter changes are similar to prior.  No CT evidence for acute intracranial abnormality.  Partially opacified paranasal sinuses.  Correlate clinically for acute sinusitis.  CT CERVICAL SPINE  Findings: Biapical scarring.  Indeterminate left thyroid lobe nodule measures approximately 1 cm.  Atherosclerotic vascular calcifications.  Maintained craniocervical relationship.  No dens fracture.  Degenerative disc disease is most pronounced at C5-6, with disc osteophyte complex resulting in mild central canal narrowing. Minimal retrolisthesis of C5 and C6 and anterolisthesis of C6 on C7.  No displaced acute fracture or malalignment. Paravertebral soft tissues within normal range.  IMPRESSION: C5-6 degenerative changes without acute osseous finding.  1 cm left thyroid lobe nodule can be further evaluated with a non emergent ultrasound follow-up.   Original Report Authenticated By: Jearld Lesch, M.D.    Microbiology: Recent Results (from the past 240 hour(s))  CULTURE, BLOOD (ROUTINE X 2)     Status: None   Collection Time    02/18/13 11:50 PM      Result Value Range Status   Specimen Description BLOOD RIGHT HAND   Final   Special Requests     Final   Value: BOTTLES DRAWN AEROBIC AND ANAEROBIC 2CC BOTH BOTTLES   Culture  Setup Time 02/19/2013 10:37   Final    Culture     Final   Value:        BLOOD CULTURE RECEIVED NO GROWTH TO DATE CULTURE WILL BE HELD FOR 5 DAYS BEFORE ISSUING A FINAL NEGATIVE REPORT   Report Status PENDING   Incomplete  CULTURE, BLOOD (ROUTINE X 2)     Status: None   Collection Time    02/18/13 11:55 PM      Result Value Range Status   Specimen Description BLOOD  LEFT HAND   Final   Special Requests     Final   Value: BOTTLES DRAWN AEROBIC AND ANAEROBIC 2CC BOTH BOTTLES   Culture  Setup Time 02/19/2013 10:37   Final   Culture     Final   Value:        BLOOD CULTURE RECEIVED NO GROWTH TO DATE CULTURE WILL BE HELD FOR 5 DAYS BEFORE ISSUING A FINAL NEGATIVE REPORT   Report Status PENDING   Incomplete  URINE CULTURE     Status: None   Collection Time    02/19/13  1:06 AM      Result Value Range Status   Specimen Description URINE, RANDOM   Final   Special Requests NONE   Final   Culture  Setup Time 02/19/2013 11:34   Final   Colony Count >=100,000 COLONIES/ML   Final   Culture GRAM NEGATIVE RODS   Final   Report Status PENDING   Incomplete     Labs: Basic Metabolic Panel:  Recent Labs Lab 02/17/13 0515 02/18/13 0550  NA 140 140  K 3.7 3.8  CL 103 103  CO2 30 31  GLUCOSE 104* 95  BUN 25* 21  CREATININE 2.11* 1.89*  CALCIUM 9.5 9.1   Liver Function Tests:  Recent Labs Lab 02/17/13 0515  AST 10  ALT 10  ALKPHOS 84  BILITOT 0.2*  PROT 7.0  ALBUMIN 3.1*   No results found for this basename: LIPASE, AMYLASE,  in the last 168 hours No results found for this basename: AMMONIA,  in the last 168 hours CBC:  Recent Labs Lab 02/17/13 0515 02/18/13 0550 02/18/13 2350  WBC 8.0 8.4 11.6*  NEUTROABS 5.0  --  9.5*  HGB 11.2* 11.3* 12.6*  HCT 36.2* 36.7* 39.5  MCV 88.1 88.6 88.0  PLT 173 161 165   Cardiac Enzymes: No results found for this basename: CKTOTAL, CKMB, CKMBINDEX, TROPONINI,  in the last 168 hours BNP: BNP (last 3 results)  Recent Labs  08/27/12 1030 12/13/12 1946 01/12/13 1530   PROBNP 108.8 119.1 102.4   CBG: No results found for this basename: GLUCAP,  in the last 168 hours     Signed:  Marinda Elk  [facility] 02/20/2013, 9:13 AM

## 2013-02-18 NOTE — Progress Notes (Signed)
Clinical Social Work Department CLINICAL SOCIAL WORK PLACEMENT NOTE 02/18/2013  Patient:  CARYL, MANAS  Account Number:  [id number] Admit date:  02/17/2013  Clinical Social Worker:  Doroteo Glassman  Date/time:  02/18/2013 02:02 PM  Clinical Social Work is seeking post-discharge placement for this patient at the following level of care:   SKILLED NURSING   (*CSW will update this form in Epic as items are completed)   declined  Patient/family provided with [facility] Department of Clinical Social Work's list of facilities offering this level of care within the geographic area requested by the patient (or if unable, by the patient's family).  02/18/13  Patient/family informed of their freedom to choose among providers that offer the needed level of care, that participate in Medicare, Medicaid or managed care program needed by the patient, have an available bed and are willing to accept the patient.  N/a--only want [facility]  Patient/family informed of [facility]' ownership interest in [facility], as well as of the fact that they are under no obligation to receive care at this facility.  PASARR submitted to [employer] on existing PASARR number received from [employer] on   FL2 transmitted to all facilities in geographic area requested by pt/family on  02/18/2013 FL2 transmitted to all facilities within larger geographic area on   Patient informed that his/her managed care company has contracts with or will negotiate with  certain facilities, including the following:     Patient/family informed of bed offers received:   Patient chooses bed at  Physician recommends and patient chooses bed at    Patient to be transferred to  on   Patient to be transferred to facility by   The following physician request were entered in Epic:   Additional Comments:

## 2013-02-18 NOTE — Evaluation (Signed)
Occupational Therapy Evaluation Patient Details Name: Terry Glasheen Friedlander Sr. MRN: [id number] DOB: Feb 11, 1933 Today's Date: 02/18/2013 Time: 2952-8413 OT Time Calculation (min): 32 min  OT Assessment / Plan / Recommendation Clinical Impression  Pt admitted from home with multiple falls, FTT, and wandering.  Pt has baseline dementia.  Pt presents with dependence in bathing, dressing, toileting and requires +2 assist for out of bed.  He is not able to ambulate this date.  Will follow acutely.  Pt's family is not able to provide adequate care for him at home, recommending SNF.    OT Assessment  Patient needs continued OT Services    Follow Up Recommendations  SNF    Barriers to Discharge Decreased caregiver support    Equipment Recommendations  None recommended by OT    Recommendations for Other Services    Frequency  Min 2X/week    Precautions / Restrictions Precautions Precautions: Fall Precaution Comments: monitor sats Restrictions Weight Bearing Restrictions: No   Pertinent Vitals/Pain No pain, 02 sats in upper 80s on RA, mid 90s on 2L    ADL  Eating/Feeding: Set up Where Assessed - Eating/Feeding: Chair Grooming: Wash/dry hands;Wash/dry face;Set up;Denture care;Moderate assistance Where Assessed - Grooming: Supported sitting Upper Body Bathing: Moderate assistance Where Assessed - Upper Body Bathing: Unsupported sitting Lower Body Bathing: +1 Total assistance Where Assessed - Lower Body Bathing: Unsupported sitting;Supported sit to stand Upper Body Dressing: Minimal assistance Where Assessed - Upper Body Dressing: Unsupported sitting Lower Body Dressing: +1 Total assistance Where Assessed - Lower Body Dressing: Unsupported sitting;Supported sit to stand Transfers/Ambulation Related to ADLs: Unable to ambulate. Posterior lean in standing.    OT Diagnosis: Generalized weakness;Cognitive deficits  OT Problem List: Decreased strength;Decreased activity tolerance;Impaired  balance (sitting and/or standing);Decreased coordination;Decreased cognition;Decreased safety awareness;Decreased knowledge of use of DME or AE;Obesity;Impaired UE functional use;Increased edema OT Treatment Interventions: Self-care/ADL training;DME and/or AE instruction;Therapeutic activities;Patient/family education   OT Goals Acute Rehab OT Goals OT Goal Formulation: With patient Time For Goal Achievement: 03-26-2013 Potential to Achieve Goals: Fair ADL Goals Pt Will Perform Grooming: with supervision;Sitting, chair ADL Goal: Grooming - Progress: Goal set today Pt Will Perform Upper Body Bathing: with min assist;Sitting, chair;Sitting, edge of bed ADL Goal: Upper Body Bathing - Progress: Goal set today Pt Will Perform Upper Body Dressing: with supervision;Sitting, chair;Sitting, bed ADL Goal: Upper Body Dressing - Progress: Goal set today Pt Will Transfer to Toilet: with mod assist;3-in-1;Stand pivot transfer ADL Goal: Toilet Transfer - Progress: Goal set today  Visit Information  Last OT Received On: 02/18/13 Assistance Needed: +2 PT/OT Co-Evaluation/Treatment: Yes    Subjective Data  Subjective: "I need to pee, I've been trying for 2 hours."   Prior Functioning     Home Living Lives With: Son Available Help at Discharge: Skilled Nursing Facility Type of Home: Skilled Nursing Facility Home Adaptive Equipment: Dan Humphreys - four wheeled Additional Comments: son reports unable to provide care at this time. Prior Function Comments: Pt reports he could bathe and dress himself PTA. Communication Communication: No difficulties Dominant Hand: Right         Vision/Perception Vision - History Patient Visual Report: No change from baseline   Cognition  Cognition Arousal/Alertness: Awake/alert Behavior During Therapy: WFL for tasks assessed/performed Overall Cognitive Status: History of cognitive impairments - at baseline    Extremity/Trunk Assessment Right Upper Extremity  Assessment RUE ROM/Strength/Tone: WFL for tasks assessed RUE Coordination: Deficits RUE Coordination Deficits: slow coordination, drops dentures Left Upper Extremity Assessment LUE ROM/Strength/Tone: [facility] for  tasks assessed LUE Coordination: Deficits LUE Coordination Deficits: slow movements, drops dentures Right Lower Extremity Assessment RLE ROM/Strength/Tone: Deficits RLE ROM/Strength/Tone Deficits: has strength to stand but not to take any steps. Left Lower Extremity Assessment LLE ROM/Strength/Tone: Deficits LLE ROM/Strength/Tone Deficits: same as R     Mobility Bed Mobility Bed Mobility: Not assessed Transfers Sit to Stand: From chair/3-in-1;With upper extremity assist;With armrests;1: +2 Total assist Sit to Stand: Patient Percentage: 40% Stand to Sit: 1: +2 Total assist;To chair/3-in-1;With armrests;With upper extremity assist Stand to Sit: Patient Percentage: 50% Details for Transfer Assistance: pt requires lifting assistance from chair, pt has retropulsion while standing.      Exercise     Balance Balance Balance Assessed: Yes Static Standing Balance Static Standing - Balance Support: Bilateral upper extremity supported Static Standing - Level of Assistance: 1: +1 Total assist Static Standing - Comment/# of Minutes: pt will lean backward while standing and has to have support   End of Session OT - End of Session Activity Tolerance: Patient limited by fatigue Patient left: in chair;with call bell/phone within reach Nurse Communication: Mobility status  GO Functional Assessment Tool Used: clinical judgment Functional Limitation: Self care Self Care Current Status (Z6109): At least 80 percent but less than 100 percent impaired, limited or restricted Self Care Goal Status (U0454): At least 40 percent but less than 60 percent impaired, limited or restricted   Evern Bio 02/18/2013, 11:08 AM [phone]

## 2013-02-18 NOTE — Evaluation (Addendum)
Physical Therapy Evaluation Patient Details Name: Terry Kendzierski Strehlow Sr. MRN: [id number] DOB: May 24, 1933 Today's Date: 02/18/2013 Time: 2440-1027 PT Time Calculation (min): 19 min  PT Assessment / Plan / Recommendation Clinical Impression  Pt is 79 yo male admitted from home with multiple falls on 02/17/13., increased confusion with h/o dementia.. Per son, pt can not be cared for at home any longer due to these issues. Pt did stand but does have posterior lean. Pt will benefit from PT while in acute care. Recommend SNF at DC.    PT Assessment  Patient needs continued PT services    Follow Up Recommendations  SNF    Does the patient have the potential to tolerate intense rehabilitation      Barriers to Discharge Decreased caregiver support      Equipment Recommendations  None recommended by PT    Recommendations for Other Services     Frequency Min 3X/week    Precautions / Restrictions Precautions Precautions: Fall Precaution Comments: monitor sats Restrictions Weight Bearing Restrictions: No   Pertinent Vitals/Pain sats 88% RA, replace 2  l  And up to 95%      Mobility  Bed Mobility Bed Mobility: Not assessed Transfers Transfers: Sit to Stand;Stand to Sit Sit to Stand: From chair/3-in-1;With upper extremity assist;With armrests;1: +2 Total assist Sit to Stand: Patient Percentage: 40% Stand to Sit: 1: +2 Total assist;To chair/3-in-1;With armrests;With upper extremity assist Stand to Sit: Patient Percentage: 50% Details for Transfer Assistance: pt requires lifting assistance from chair, pt has retropulsion while standing.  Ambulation/Gait Ambulation/Gait Assistance: Not tested (comment) Ambulation/Gait Assistance Details: Pt was not able to [facility] each leg to attempt to take a step.    Exercises     PT Diagnosis: Generalized weakness  PT Problem List: Decreased strength;Decreased activity tolerance;Decreased mobility;Decreased cognition;Decreased knowledge of use of  DME;Decreased safety awareness;Decreased knowledge of precautions;Cardiopulmonary status limiting activity PT Treatment Interventions: DME instruction;Gait training;Functional mobility training;Therapeutic activities;Therapeutic exercise;Patient/family education   PT Goals Acute Rehab PT Goals PT Goal Formulation: Patient unable to participate in goal setting Time For Goal Achievement: 03-29-2013 Potential to Achieve Goals: Fair Pt will go Supine/Side to Sit: with supervision PT Goal: Supine/Side to Sit - Progress: Goal set today Pt will go Sit to Supine/Side: with supervision PT Goal: Sit to Supine/Side - Progress: Goal set today Pt will go Sit to Stand: with min assist PT Goal: Sit to Stand - Progress: Goal set today Pt will go Stand to Sit: with min assist PT Goal: Stand to Sit - Progress: Goal set today Pt will Transfer Bed to Chair/Chair to Bed: with min assist PT Transfer Goal: Bed to Chair/Chair to Bed - Progress: Goal set today Pt will Ambulate: 16 - 50 feet;with min assist PT Goal: Ambulate - Progress: Goal set today  Visit Information  Last PT Received On: 02/18/13 Assistance Needed: +2    Subjective Data      Prior Functioning  Home Living Lives With: Son Type of Home: Skilled Nursing Facility Home Adaptive Equipment: Dan Humphreys - four wheeled Additional Comments: son reports unable to provide care at this time. Communication Communication: No difficulties    Cognition  Cognition Arousal/Alertness: Awake/alert Behavior During Therapy: WFL for tasks assessed/performed Overall Cognitive Status: History of cognitive impairments - at baseline    Extremity/Trunk Assessment Right Lower Extremity Assessment RLE ROM/Strength/Tone: Deficits RLE ROM/Strength/Tone Deficits: has strength to stand but not to take any steps. Left Lower Extremity Assessment LLE ROM/Strength/Tone: Deficits LLE ROM/Strength/Tone Deficits: same as R  Balance Balance Balance Assessed: Yes Static  Standing Balance Static Standing - Balance Support: Bilateral upper extremity supported Static Standing - Level of Assistance: 1: +1 Total assist Static Standing - Comment/# of Minutes: pt will lean backward while standing and has to have support  End of Session PT - End of Session Activity Tolerance: Patient tolerated treatment well Patient left: with call bell/phone within reach Nurse Communication: Mobility status  GP     Rada Hay 02/18/2013, 10:35 AM Blanchard Kelch PT [phone]

## 2013-02-18 NOTE — Progress Notes (Addendum)
Per RN, family has been unreachable since they brought Pt to the ED.  CSW met briefly with Pt, who stated that he lives with his son and his daughter-in-law.  He stated that he will d/c home today.  CSW left messages at all numbers listed for Pt on his facesheet.  CSW requested non-emergent assistance in [place] to go to Pt's residence in an attempt to locate family.  [name], LCSWA Clinical Social Work [phone]

## 2013-02-18 NOTE — Progress Notes (Signed)
Clinical Social Work Department BRIEF PSYCHOSOCIAL ASSESSMENT 02/18/2013  Patient:  Terry Flynn, Terry Flynn     Account Number:  [id number]     Admit date:  02/17/2013  Clinical Social Worker:  Doroteo Glassman  Date/Time:  02/18/2013 01:48 PM  Referred by:  Physician  Date Referred:  02/18/2013 Referred for  SNF Placement   Other Referral:   Interview type:  Other - See comment Other interview type:   Pt's daughter-in-law via phone.  Pt's son was in the background.    PSYCHOSOCIAL DATA Living Status:  FAMILY Admitted from facility:   Level of care:   Primary support name:  Terry Flynn. Primary support relationship to patient:  CHILD, ADULT Degree of support available:   adequate    CURRENT CONCERNS Current Concerns  Post-Acute Placement   Other Concerns:    SOCIAL WORK ASSESSMENT / PLAN Spoke with Pt's daughter-in-law, Terry Flynn, via phone, with Pt's son in the background providing information, as well.    Terry Flynn and son wanting Pt placed at [facility], as they agree with the PT recommendation for SNF.  Terry Flynn and son stating that Pt needs rehab before he can return home.    CSW explained that authorization will need to be obtained from [employer].  Family expressed an understanding.    CSW thanked [place] and Pt's son for their time.   Assessment/plan status:  Psychosocial Support/Ongoing Assessment of Needs Other assessment/ plan:   Information/referral to community resources:   N/a--family only wanting [facility]    PATIENT'S/FAMILY'S RESPONSE TO PLAN OF CARE: Family thanked CSW for time and assistance.   [name], LCSWA Clinical Social Work [phone]

## 2013-02-18 NOTE — Care Management Note (Addendum)
    Page 1 of 1   02/18/2013     3:16:31 PM   CARE MANAGEMENT NOTE 02/18/2013  Patient:  Terry Flynn, Terry Flynn   Account Number:  [id number]  Date Initiated:  02/18/2013  Documentation initiated by:  Lanier Clam  Subjective/Objective Assessment:   ADMITTED W/DEMENTIA,[place].OZ:HYQMVHQI.     Action/Plan:   FROM HOME.   Anticipated DC Date:  02/19/2013   Anticipated DC Plan:  SKILLED NURSING FACILITY      DC Planning Services  CM consult      Choice offered to / List presented to:             Status of service:  In process, will continue to follow Medicare Important Message given?   (If response is "NO", the following Medicare IM given date fields will be blank) Date Medicare IM given:   Date Additional Medicare IM given:    Discharge Disposition:    Per UR Regulation:    If discussed at Long Length of Stay Meetings, dates discussed:    Comments:  02/18/13 Krissi Willaims RN,BSN NCM WEEKEND [phone] PT-SNF.SW FOLLOWING.NOTED INSURANCE SHOWING BLUE MEDICARE AS PRIMARY-OBSERVATION STATUS,3 DAY INPATIENT QUALIFYING STAY NOT NEEDED FOR SNF,BUT WILL NEED AUTH FROM INSURANCE.

## 2013-02-18 NOTE — ED Provider Notes (Signed)
Medical screening examination/treatment/procedure(s) were performed by non-physician practitioner and as supervising physician I was immediately available for consultation/collaboration.  Raheem Kolbe R. Aaleyah Witherow, MD 02/18/13 0802 

## 2013-02-19 DIAGNOSIS — R509 Fever, unspecified: Secondary | ICD-10-CM | POA: Diagnosis present

## 2013-02-19 DIAGNOSIS — N390 Urinary tract infection, site not specified: Principal | ICD-10-CM

## 2013-02-19 DIAGNOSIS — D72829 Elevated white blood cell count, unspecified: Secondary | ICD-10-CM | POA: Diagnosis present

## 2013-02-19 LAB — URINALYSIS, ROUTINE W REFLEX MICROSCOPIC
Bilirubin Urine: NEGATIVE
Glucose, UA: NEGATIVE mg/dL
Hgb urine dipstick: NEGATIVE
Ketones, ur: NEGATIVE mg/dL
Protein, ur: NEGATIVE mg/dL
pH: 8.5 — ABNORMAL HIGH (ref 5.0–8.0)

## 2013-02-19 LAB — CBC WITH DIFFERENTIAL/PLATELET
Basophils Absolute: 0 10*3/uL (ref 0.0–0.1)
Eosinophils Absolute: 0.1 10*3/uL (ref 0.0–0.7)
Eosinophils Relative: 1 % (ref 0–5)
Lymphocytes Relative: 9 % — ABNORMAL LOW (ref 12–46)
Lymphs Abs: 1 10*3/uL (ref 0.7–4.0)
MCH: 28.1 pg (ref 26.0–34.0)
MCV: 88 fL (ref 78.0–100.0)
Neutrophils Relative %: 82 % — ABNORMAL HIGH (ref 43–77)
Platelets: 165 10*3/uL (ref 150–400)
RBC: 4.49 MIL/uL (ref 4.22–5.81)
RDW: 14.8 % (ref 11.5–15.5)
WBC: 11.6 10*3/uL — ABNORMAL HIGH (ref 4.0–10.5)

## 2013-02-19 LAB — URINE MICROSCOPIC-ADD ON

## 2013-02-19 MED ORDER — DEXTROSE 5 % IV SOLN
1.0000 g | INTRAVENOUS | Status: DC
  Administered 2013-02-19 – 2013-02-20 (×2): 1 g via INTRAVENOUS
  Filled 2013-02-19 (×2): qty 10

## 2013-02-19 NOTE — Progress Notes (Signed)
[facility] PROGRESS NOTE  Assessment/Plan: *UTI (urinary tract infection) - SPIKE temp overnight. Mild leukocytosis. UC pending. - start rocephin IV. - PT consult.  Dementia: - stable.  CKD (chronic kidney disease) stage 3, GFR 30-59 ml/min - stable.  Code Status: Full  Family Communication: Pt at bedside  Disposition Plan: Admit to medical floor     Consultants:  none  Procedures:  none  Antibiotics:  Rocephin 6.2.2014  HPI/Subjective: Some mild suprapubic discomfort.  Objective: Filed Vitals:   02/18/13 1942 02/18/13 2205 02/19/13 0030 02/19/13 0526  BP:  139/68  133/70  Pulse:  87  87  Temp:  101.4 F (38.6 C) 98.7 F (37.1 C) 99.4 F (37.4 C)  TempSrc:  Oral Oral Oral  Resp:  18  18  Height:      Weight:      SpO2: 96% 93%  96%    Intake/Output Summary (Last 24 hours) at 02/19/13 0913 Last data filed at 02/19/13 0626  Gross per 24 hour  Intake      0 ml  Output   1250 ml  Net  -1250 ml   Filed Weights   02/17/13 1311  Weight: 98.7 kg (217 lb 9.5 oz)    Exam:  General: Alert, awake, oriented x3, in no acute distress.  HEENT: No bruits, no goiter.  Heart: Regular rate and rhythm, without murmurs, rubs, gallops.  Lungs: Good air movement, clear to auscultation. Abdomen: Soft, nontender, nondistended, positive bowel sounds.  Neuro: Grossly intact, nonfocal.   Data Reviewed: Basic Metabolic Panel:  Recent Labs Lab 02/17/13 0515 02/18/13 0550  NA 140 140  K 3.7 3.8  CL 103 103  CO2 30 31  GLUCOSE 104* 95  BUN 25* 21  CREATININE 2.11* 1.89*  CALCIUM 9.5 9.1   Liver Function Tests:  Recent Labs Lab 02/17/13 0515  AST 10  ALT 10  ALKPHOS 84  BILITOT 0.2*  PROT 7.0  ALBUMIN 3.1*   No results found for this basename: LIPASE, AMYLASE,  in the last 168 hours No results found for this basename: AMMONIA,  in the last 168 hours CBC:  Recent Labs Lab 02/17/13 0515 02/18/13 0550 02/18/13 2350  WBC 8.0 8.4 11.6*   NEUTROABS 5.0  --  9.5*  HGB 11.2* 11.3* 12.6*  HCT 36.2* 36.7* 39.5  MCV 88.1 88.6 88.0  PLT 173 161 165   Cardiac Enzymes: No results found for this basename: CKTOTAL, CKMB, CKMBINDEX, TROPONINI,  in the last 168 hours BNP (last 3 results)  Recent Labs  08/27/12 1030 12/13/12 1946 01/12/13 1530  PROBNP 108.8 119.1 102.4   CBG: No results found for this basename: GLUCAP,  in the last 168 hours  No results found for this or any previous visit (from the past 240 hour(s)).   Studies: Dg Chest Port 1 View  02/19/2013   *RADIOLOGY REPORT*  Clinical Data: New onset of fever.  PORTABLE CHEST - 1 VIEW  Comparison: Chest radiograph performed 02/17/2013  Findings: The lungs are well-aerated.  Vascular congestion is noted, without definite evidence of pulmonary edema.  There is no evidence of focal opacification, pleural effusion or pneumothorax.  The cardiomediastinal silhouette is borderline enlarged.  No acute osseous abnormalities are seen.  IMPRESSION: Vascular congestion and borderline cardiomegaly, without definite evidence of pulmonary edema.   Original Report Authenticated By: Tonia Ghent, M.D.    Scheduled Meds: . amLODipine  10 mg Oral q morning - 10a  . cefTRIAXone (ROCEPHIN)  IV  1 g  Intravenous Q24H  . clopidogrel  75 mg Oral QPM  . enoxaparin (LOVENOX) injection  30 mg Subcutaneous Q24H  . febuxostat  80 mg Oral q morning - 10a  . hydrochlorothiazide  12.5 mg Oral q morning - 10a  . Ipratropium-Albuterol  2 puff Inhalation TID  . Memantine HCl ER  28 mg Oral QPM  . PARoxetine  40 mg Oral Daily  . risperiDONE  0.5 mg Oral Daily  . risperiDONE  0.5 mg Oral QHS  . rivastigmine  4.6 mg Transdermal Daily  . tamsulosin  0.4 mg Oral Daily  . traZODone  50 mg Oral QHS   Continuous Infusions:    Marinda Elk  [facility] Pager [phone]. If 8PM-8AM, please contact night-coverage at [email or web address], password [facility] 02/19/2013, 9:13 AM  LOS: 2 days

## 2013-02-19 NOTE — Progress Notes (Signed)
CSW spoke with patient's daughter. Discussed that [facility] not take [employer] [facility]. She is agreeable to csw faxing info out to all local facilities.  Primo Innis C. Jemario Poitras MSW, LCSW [phone]

## 2013-02-19 NOTE — Progress Notes (Signed)
Occupational Therapy Treatment Patient Details Name: Terry Creary Yeager Sr. MRN: [id number] DOB: 12-01-1932 Today's Date: 02/19/2013 Time: 1191-4782 OT Time Calculation (min): 30 min  OT Assessment / Plan / Recommendation    Follow Up Recommendations  SNF                Plan Discharge plan remains appropriate           ADL  Grooming: Performed;Wash/dry face;Teeth care;Moderate assistance Where Assessed - Grooming: Supported sitting      OT Goals ADL Goals ADL Goal: Grooming - Progress: Progressing toward goals  Visit Information  Last OT Received On: 02/19/13    Subjective Data  Subjective: Pt agreed to sit EOB         Mobility  Bed Mobility Bed Mobility: Supine to Sit Supine to Sit: 1: +2 Total assist;HOB elevated Supine to Sit: Patient Percentage: 30% Details for Bed Mobility Assistance: Upon sitting EOB pt able to hold onto bed rail and maintain sitting balance with mod A ( CNA helped OT sit pt EOB) Transfers Transfers: Not assessed          End of Session OT - End of Session Activity Tolerance: Patient limited by fatigue Patient left: in bed;with call bell/phone within reach Nurse Communication: Mobility status  GO Functional Assessment Tool Used: clinical observation Functional Limitation: Self care Self Care Current Status (N5621): At least 80 percent but less than 100 percent impaired, limited or restricted Self Care Goal Status (H0865): At least 40 percent but less than 60 percent impaired, limited or restricted   Jaccob Czaplicki, [place] 02/19/2013, 2:31 PM

## 2013-02-20 ENCOUNTER — Telehealth (HOSPITAL_COMMUNITY): Payer: Self-pay | Admitting: *Deleted

## 2013-02-20 ENCOUNTER — Telehealth: Payer: Self-pay | Admitting: *Deleted

## 2013-02-20 DIAGNOSIS — N179 Acute kidney failure, unspecified: Secondary | ICD-10-CM

## 2013-02-20 LAB — URINE CULTURE: Colony Count: >=100000

## 2013-02-20 MED ORDER — CIPROFLOXACIN HCL 500 MG PO TABS: 500.0000 mg | ORAL_TABLET | Freq: Two times a day (BID) | ORAL | Status: DC

## 2013-02-20 NOTE — Progress Notes (Signed)
[facility]  Patient Status: Active (receiving services up to time of hospitalization)  [facility] is providing the following services: RN, PT and OT  If patient discharges after hours, please call [phone].   Terry Flynn 02/20/2013, 12:22 PM

## 2013-02-20 NOTE — Progress Notes (Signed)
HHPT/OT/RN services have been set up with [facility].

## 2013-02-20 NOTE — Progress Notes (Signed)
CSW received call from shannon at [employer]. Patient has maxed out his snf benefit and has used all 100 days as of may 23. Patient must go home with home health.  Terry Flynn MSW, LCSW [phone]

## 2013-02-20 NOTE — Progress Notes (Signed)
Patient evaluated for long-term disease management services with [facility] Care [facility] Program as a benefit of his  [employer]. Met with patient and daughter in law/caregiver, Makari Portman, at bedside to explain services. Explained that [facility] will not interfere with his home health care arrangements. However, will serve as an additional resource with the goal to help keep him healthy at home. Consents signed. Patient will receive a post discharge transition of care call and monthly home visits for assessments and for education. Confirmed best contact information. Left packet and contact information with Sonya.      Hal Morales, [facility], [phone]

## 2013-02-20 NOTE — Telephone Encounter (Signed)
Pt's daughter-in-law called and notified that pt needs to go back to hospital for readmission due to positive blood cultures

## 2013-02-20 NOTE — Progress Notes (Signed)
Terry Flynn, daughter in law of patient phoned to express her concern that Terry Flynn from [facility] instructing her to bring her father in law back to the hospital because his blood cultures were positive for everything.  I obtained her number and referred this to Dr. David Stall.

## 2013-02-21 LAB — CULTURE, BLOOD (ROUTINE X 2)

## 2013-02-25 LAB — CULTURE, BLOOD (ROUTINE X 2): Culture: NO GROWTH

## 2013-02-26 ENCOUNTER — Encounter (HOSPITAL_COMMUNITY): Payer: Self-pay | Admitting: Emergency Medicine

## 2013-02-26 ENCOUNTER — Telehealth: Payer: Self-pay | Admitting: *Deleted

## 2013-02-26 ENCOUNTER — Emergency Department (HOSPITAL_COMMUNITY): Payer: Medicare Other

## 2013-02-26 ENCOUNTER — Telehealth: Payer: Self-pay | Admitting: Family Medicine

## 2013-02-26 ENCOUNTER — Observation Stay (HOSPITAL_COMMUNITY)
Admission: EM | Admit: 2013-02-26 | Discharge: 2013-02-27 | Disposition: A | Discharge status: Home / Self Care | Payer: Medicare Other | Attending: Internal Medicine | Admitting: Internal Medicine

## 2013-02-26 ENCOUNTER — Ambulatory Visit: Payer: Self-pay | Admitting: Family Medicine

## 2013-02-26 DIAGNOSIS — E860 Dehydration: Secondary | ICD-10-CM | POA: Diagnosis present

## 2013-02-26 DIAGNOSIS — Z515 Encounter for palliative care: Secondary | ICD-10-CM

## 2013-02-26 DIAGNOSIS — R531 Weakness: Secondary | ICD-10-CM

## 2013-02-26 DIAGNOSIS — Z79899 Other long term (current) drug therapy: Secondary | ICD-10-CM | POA: Insufficient documentation

## 2013-02-26 DIAGNOSIS — IMO0002 Reserved for concepts with insufficient information to code with codable children: Secondary | ICD-10-CM | POA: Insufficient documentation

## 2013-02-26 DIAGNOSIS — Z7401 Bed confinement status: Secondary | ICD-10-CM | POA: Insufficient documentation

## 2013-02-26 DIAGNOSIS — R0902 Hypoxemia: Secondary | ICD-10-CM | POA: Insufficient documentation

## 2013-02-26 DIAGNOSIS — R627 Adult failure to thrive: Secondary | ICD-10-CM

## 2013-02-26 DIAGNOSIS — R52 Pain, unspecified: Secondary | ICD-10-CM

## 2013-02-26 DIAGNOSIS — J4489 Other specified chronic obstructive pulmonary disease: Secondary | ICD-10-CM | POA: Insufficient documentation

## 2013-02-26 DIAGNOSIS — D72829 Elevated white blood cell count, unspecified: Secondary | ICD-10-CM

## 2013-02-26 DIAGNOSIS — J449 Chronic obstructive pulmonary disease, unspecified: Secondary | ICD-10-CM | POA: Insufficient documentation

## 2013-02-26 DIAGNOSIS — F411 Generalized anxiety disorder: Secondary | ICD-10-CM | POA: Insufficient documentation

## 2013-02-26 DIAGNOSIS — E785 Hyperlipidemia, unspecified: Secondary | ICD-10-CM | POA: Insufficient documentation

## 2013-02-26 DIAGNOSIS — J9601 Acute respiratory failure with hypoxia: Secondary | ICD-10-CM | POA: Diagnosis present

## 2013-02-26 DIAGNOSIS — R4189 Other symptoms and signs involving cognitive functions and awareness: Secondary | ICD-10-CM | POA: Diagnosis present

## 2013-02-26 DIAGNOSIS — R062 Wheezing: Secondary | ICD-10-CM | POA: Insufficient documentation

## 2013-02-26 DIAGNOSIS — J9600 Acute respiratory failure, unspecified whether with hypoxia or hypercapnia: Secondary | ICD-10-CM | POA: Insufficient documentation

## 2013-02-26 DIAGNOSIS — F0390 Unspecified dementia without behavioral disturbance: Secondary | ICD-10-CM | POA: Insufficient documentation

## 2013-02-26 DIAGNOSIS — R4182 Altered mental status, unspecified: Principal | ICD-10-CM

## 2013-02-26 DIAGNOSIS — I252 Old myocardial infarction: Secondary | ICD-10-CM | POA: Insufficient documentation

## 2013-02-26 DIAGNOSIS — R0602 Shortness of breath: Secondary | ICD-10-CM

## 2013-02-26 DIAGNOSIS — Z8673 Personal history of transient ischemic attack (TIA), and cerebral infarction without residual deficits: Secondary | ICD-10-CM | POA: Insufficient documentation

## 2013-02-26 DIAGNOSIS — N179 Acute kidney failure, unspecified: Secondary | ICD-10-CM

## 2013-02-26 DIAGNOSIS — R0600 Dyspnea, unspecified: Secondary | ICD-10-CM

## 2013-02-26 DIAGNOSIS — I129 Hypertensive chronic kidney disease with stage 1 through stage 4 chronic kidney disease, or unspecified chronic kidney disease: Secondary | ICD-10-CM | POA: Insufficient documentation

## 2013-02-26 DIAGNOSIS — Z9181 History of falling: Secondary | ICD-10-CM | POA: Insufficient documentation

## 2013-02-26 DIAGNOSIS — I2510 Atherosclerotic heart disease of native coronary artery without angina pectoris: Secondary | ICD-10-CM | POA: Insufficient documentation

## 2013-02-26 DIAGNOSIS — N183 Chronic kidney disease, stage 3 unspecified: Secondary | ICD-10-CM | POA: Insufficient documentation

## 2013-02-26 DIAGNOSIS — I517 Cardiomegaly: Secondary | ICD-10-CM | POA: Insufficient documentation

## 2013-02-26 DIAGNOSIS — Z87440 Personal history of urinary (tract) infections: Secondary | ICD-10-CM | POA: Insufficient documentation

## 2013-02-26 DIAGNOSIS — R509 Fever, unspecified: Secondary | ICD-10-CM

## 2013-02-26 LAB — BLOOD GAS, ARTERIAL
Bicarbonate: 24.8 mEq/L — ABNORMAL HIGH (ref 20.0–24.0)
FIO2: 0.21 %
O2 Saturation: 86.6 %
Patient temperature: 98.6
pH, Arterial: 7.38 (ref 7.350–7.450)

## 2013-02-26 LAB — CBC WITH DIFFERENTIAL/PLATELET
Basophils Absolute: 0 10*3/uL (ref 0.0–0.1)
Basophils Relative: 0 % (ref 0–1)
Eosinophils Relative: 1 % (ref 0–5)
HCT: 40.7 % (ref 39.0–52.0)
Lymphocytes Relative: 15 % (ref 12–46)
MCHC: 31.4 g/dL (ref 30.0–36.0)
Monocytes Absolute: 1.2 10*3/uL — ABNORMAL HIGH (ref 0.1–1.0)
Neutro Abs: 12 10*3/uL — ABNORMAL HIGH (ref 1.7–7.7)
Platelets: 257 10*3/uL (ref 150–400)
RDW: 14.4 % (ref 11.5–15.5)
WBC: 15.7 10*3/uL — ABNORMAL HIGH (ref 4.0–10.5)

## 2013-02-26 LAB — URINALYSIS, ROUTINE W REFLEX MICROSCOPIC
Hgb urine dipstick: NEGATIVE
Leukocytes, UA: NEGATIVE
Nitrite: NEGATIVE
Protein, ur: NEGATIVE mg/dL
Specific Gravity, Urine: 1.018 (ref 1.005–1.030)
Urobilinogen, UA: 0.2 mg/dL (ref 0.0–1.0)

## 2013-02-26 LAB — COMPREHENSIVE METABOLIC PANEL
ALT: 8 U/L (ref 0–53)
AST: 11 U/L (ref 0–37)
Albumin: 3.4 g/dL — ABNORMAL LOW (ref 3.5–5.2)
CO2: 28 mEq/L (ref 19–32)
Calcium: 9.5 mg/dL (ref 8.4–10.5)
Chloride: 102 mEq/L (ref 96–112)
Creatinine, Ser: 2.04 mg/dL — ABNORMAL HIGH (ref 0.50–1.35)
Sodium: 139 mEq/L (ref 135–145)

## 2013-02-26 MED ORDER — MORPHINE SULFATE 2 MG/ML IJ SOLN: 1.0000 mg | INTRAMUSCULAR | Status: DC | PRN

## 2013-02-26 MED ORDER — DEXTROSE-NACL 5-0.9 % IV SOLN
INTRAVENOUS | Status: DC
  Administered 2013-02-26 – 2013-02-27 (×2): via INTRAVENOUS

## 2013-02-26 MED ORDER — SODIUM CHLORIDE 0.9 % IV BOLUS (SEPSIS)
1000.0000 mL | Freq: Once | INTRAVENOUS | Status: AC
  Administered 2013-02-26: 1000 mL via INTRAVENOUS

## 2013-02-26 MED ORDER — RIVASTIGMINE 4.6 MG/24HR TD PT24
4.6000 mg | MEDICATED_PATCH | Freq: Every day | TRANSDERMAL | Status: DC
  Administered 2013-02-26 – 2013-02-27 (×2): 4.6 mg via TRANSDERMAL
  Filled 2013-02-26 (×3): qty 1

## 2013-02-26 MED ORDER — ACETAMINOPHEN 650 MG RE SUPP: 650.0000 mg | Freq: Four times a day (QID) | RECTAL | Status: DC | PRN

## 2013-02-26 MED ORDER — LORAZEPAM 2 MG/ML IJ SOLN: 1.0000 mg | INTRAMUSCULAR | Status: DC | PRN

## 2013-02-26 MED ORDER — ACETAMINOPHEN 325 MG PO TABS
650.0000 mg | ORAL_TABLET | Freq: Four times a day (QID) | ORAL | Status: DC | PRN
  Administered 2013-02-27: 650 mg via ORAL
  Filled 2013-02-26: qty 2

## 2013-02-26 NOTE — ED Notes (Signed)
Bed:WA01<BR> Expected date:<BR> Expected time:<BR> Means of arrival:<BR> Comments:<BR>

## 2013-02-26 NOTE — ED Notes (Signed)
Report given to floor, admitting MD at bedside

## 2013-02-26 NOTE — Telephone Encounter (Signed)
Called and spoke to daughter-in-law Pt is being hospitalized for possilbe stroke

## 2013-02-26 NOTE — ED Provider Notes (Signed)
History     CSN: [id number]  Arrival date & time 02/26/13  28-Feb-1107   First MD Initiated Contact with Patient 02/26/13 1134      Chief Complaint  Patient presents with  . Altered Mental Status  . Fever  . Shortness of Breath   Level V caveat dementia history is obtained from nursing notes old records. 12 noon I attempted to call patient's home, to obtain further history, no answer voice mail unavailable (Consider location/radiation/quality/duration/timing/severity/associated sxs/prior treatment) Patient is a 79 y.o. male presenting with altered mental status, fever, and shortness of breath.  Altered Mental Status Associated symptoms: fever   Fever Shortness of Breath Associated symptoms: fever    Patient brought by EMS reportedly with fever and altered mental status and shortness of breath. Treated by EMS with Solu-Medrol albuterol and at nebulized treatment in route. Presently patient offers no complaint. Past Medical History  Diagnosis Date  . Back pain     DJD  . Hypertension   . Arteriosclerotic cardiovascular disease (ASCVD)     stent to RCA 02-28-96 and 2003-02-28  . COPD (chronic obstructive pulmonary disease)   . GERD (gastroesophageal reflux disease)   . Headache(784.0)     chronic  . Chronic renal insufficiency     baseline Creatinine-1.4  . Anxiety and depression 1996    Chronic since wife's death in 02-28-1995  . Cognitive decline 02-27-2010    progressive since 02-27-10, question of dementia  . Hyperlipidemia   . Cholelithiasis     Incidentally noted  . Heart attack     x2  . Stroke July 2013   patient had blood cultures performed 02/18/2013 1 bottle of 2 positive for staph aureus coagulase negative  Past Surgical History  Procedure Laterality Date  . Coronary stent placement  1997    RCA  . Coronary stent placement  Feb 28, 2003    in-stent restenosis, stent replaced RCA.    . Colonoscopy  10/11/2012    Procedure: COLONOSCOPY;  Surgeon: Malissa Hippo, MD;  Location: [facility];   Service: Endoscopy;  Laterality: N/A;  100    Family History  Problem Relation Age of Onset  . Throat cancer Father     Smoker  . Heart disease Brother   . Asthma Son   . Heart disease Brother     History  Substance Use Topics  . Smoking status: Former Smoker -- 1.00 packs/day for 50 years    Types: Cigarettes    Quit date: 03/24/2012  . Smokeless tobacco: Never Used  . Alcohol Use: No      Review of Systems  Unable to perform ROS: Dementia  Constitutional: Positive for fever.  Respiratory: Positive for shortness of breath.   Psychiatric/Behavioral: Positive for altered mental status.    Allergies  Statins  Home Medications   Current Outpatient Rx  Name  Route  Sig  Dispense  Refill  . albuterol (PROVENTIL HFA;VENTOLIN HFA) 108 (90 BASE) MCG/ACT inhaler   Inhalation   Inhale 2 puffs into the lungs every 6 (six) hours as needed for wheezing.   1 Inhaler   2   . amLODipine (NORVASC) 10 MG tablet   Oral   Take 10 mg by mouth every morning.          . ciprofloxacin (CIPRO) 500 MG tablet   Oral   Take 1 tablet (500 mg total) by mouth 2 (two) times daily.   10 tablet   0   . clonazePAM (KLONOPIN) 0.5  MG tablet   Oral   Take 1 tablet (0.5 mg total) by mouth 2 (two) times daily as needed for anxiety.   30 tablet   0   . clopidogrel (PLAVIX) 75 MG tablet   Oral   Take 75 mg by mouth every evening.          . Febuxostat (ULORIC) 80 MG TABS   Oral   Take 80 mg by mouth every morning.          . hydrochlorothiazide (MICROZIDE) 12.5 MG capsule   Oral   Take 12.5 mg by mouth every morning.         . Ipratropium-Albuterol (COMBIVENT) 20-100 MCG/ACT AERS respimat   Inhalation   Inhale 2 puffs into the lungs 3 (three) times daily.         . Memantine HCl ER (NAMENDA XR) 28 MG CP24   Oral   Take 1 tablet by mouth every evening.         Marland Kitchen PARoxetine (PAXIL) 40 MG tablet   Oral   Take 40 mg by mouth every morning.         . risperiDONE  (RISPERDAL) 0.5 MG tablet   Oral   Take 0.5 mg by mouth at bedtime.         . rivastigmine (EXELON) 4.6 mg/24hr   Transdermal   Place 1 patch onto the skin daily.         . tamsulosin (FLOMAX) 0.4 MG CAPS   Oral   Take 0.4 mg by mouth every morning.         . traZODone (DESYREL) 50 MG tablet   Oral   Take 50 mg by mouth at bedtime.           BP 126/66  Pulse 89  Temp(Src) 99.4 F (37.4 C) (Rectal)  Resp 22  SpO2 95%  Physical Exam  Nursing note and vitals reviewed. Constitutional: He appears well-developed and well-nourished.  Chronically ill-appearing  HENT:  Head: Normocephalic and atraumatic.  Mucous membranes dry  Eyes: Conjunctivae are normal. Pupils are equal, round, and reactive to light.  Neck: Neck supple. No tracheal deviation present. No thyromegaly present.  Cardiovascular: Normal rate and regular rhythm.   No murmur heard. Pulmonary/Chest: Effort normal and breath sounds normal.  Abdominal: Soft. Bowel sounds are normal. He exhibits no distension. There is no tenderness.  Genitourinary: Penis normal.  Musculoskeletal: Normal range of motion. He exhibits no edema and no tenderness.  Neurological: He is alert. Coordination normal.  Oriented to name moves all extremities cranial nerves 2-12 grossly intact  Skin: Skin is warm and dry. No rash noted.  Psychiatric: He has a normal mood and affect.    ED Course  Procedures (including critical care time)  Labs Reviewed - No data to display No results found. Results for orders placed during the hospital encounter of 02/26/13  URINALYSIS, ROUTINE W REFLEX MICROSCOPIC      Result Value Range   Color, Urine YELLOW  YELLOW   APPearance CLEAR  CLEAR   Specific Gravity, Urine 1.018  1.005 - 1.030   pH 5.0  5.0 - 8.0   Glucose, UA NEGATIVE  NEGATIVE mg/dL   Hgb urine dipstick NEGATIVE  NEGATIVE   Bilirubin Urine NEGATIVE  NEGATIVE   Ketones, ur NEGATIVE  NEGATIVE mg/dL   Protein, ur NEGATIVE   NEGATIVE mg/dL   Urobilinogen, UA 0.2  0.0 - 1.0 mg/dL   Nitrite NEGATIVE  NEGATIVE   Leukocytes, UA  NEGATIVE  NEGATIVE  BLOOD GAS, ARTERIAL      Result Value Range   FIO2 0.21     pH, Arterial 7.380  7.350 - 7.450   pCO2 arterial 42.9  35.0 - 45.0 mmHg   pO2, Arterial 53.1 (*) 80.0 - 100.0 mmHg   Bicarbonate 24.8 (*) 20.0 - 24.0 mEq/L   TCO2 22.4  0 - 100 mmol/L   Acid-Base Excess 0.1  0.0 - 2.0 mmol/L   O2 Saturation 86.6     Patient temperature 98.6     Collection site RIGHT RADIAL     Drawn by [id number]     Sample type ARTERIAL DRAW    CBC WITH DIFFERENTIAL      Result Value Range   WBC 15.7 (*) 4.0 - 10.5 K/uL   RBC 4.67  4.22 - 5.81 MIL/uL   Hemoglobin 12.8 (*) 13.0 - 17.0 g/dL   HCT 04.5  40.9 - 81.1 %   MCV 87.2  78.0 - 100.0 fL   MCH 27.4  26.0 - 34.0 pg   MCHC 31.4  30.0 - 36.0 g/dL   RDW 91.4  78.2 - 95.6 %   Platelets 257  150 - 400 K/uL   Neutrophils Relative % 76  43 - 77 %   Neutro Abs 12.0 (*) 1.7 - 7.7 K/uL   Lymphocytes Relative 15  12 - 46 %   Lymphs Abs 2.4  0.7 - 4.0 K/uL   Monocytes Relative 8  3 - 12 %   Monocytes Absolute 1.2 (*) 0.1 - 1.0 K/uL   Eosinophils Relative 1  0 - 5 %   Eosinophils Absolute 0.2  0.0 - 0.7 K/uL   Basophils Relative 0  0 - 1 %   Basophils Absolute 0.0  0.0 - 0.1 K/uL  COMPREHENSIVE METABOLIC PANEL      Result Value Range   Sodium 139  135 - 145 mEq/L   Potassium 4.2  3.5 - 5.1 mEq/L   Chloride 102  96 - 112 mEq/L   CO2 28  19 - 32 mEq/L   Glucose, Bld 113 (*) 70 - 99 mg/dL   BUN 26 (*) 6 - 23 mg/dL   Creatinine, Ser 2.13 (*) 0.50 - 1.35 mg/dL   Calcium 9.5  8.4 - 08.6 mg/dL   Total Protein 7.9  6.0 - 8.3 g/dL   Albumin 3.4 (*) 3.5 - 5.2 g/dL   AST 11  0 - 37 U/L   ALT 8  0 - 53 U/L   Alkaline Phosphatase 98  39 - 117 U/L   Total Bilirubin 0.2 (*) 0.3 - 1.2 mg/dL   GFR calc non Af Amer 30 (*) >90 mL/min   GFR calc Af Amer 34 (*) >90 mL/min  CG4 I-STAT (LACTIC ACID)      Result Value Range   Lactic Acid, Venous  1.42  0.5 - 2.2 mmol/L   Dg Chest 2 View  02/26/2013   *RADIOLOGY REPORT*  Clinical Data: Altered mental status, fever and shortness of breath  CHEST - 2 VIEW  Comparison: 02/18/2013 and multiple prior chest radiographs  Findings: Cardiomegaly and peribronchial thickening again noted. This is a mildly low volume film. Mild bibasilar atelectasis is identified. There is no evidence of focal airspace disease, pulmonary edema, suspicious pulmonary nodule/mass, pleural effusion, or pneumothorax. No acute bony abnormalities are identified.  IMPRESSION: Cardiomegaly with mild bibasilar atelectasis.   Original Report Authenticated By: Harmon Pier, M.D.   Dg Chest 2 View  02/17/2013   *RADIOLOGY REPORT* the  Clinical Data: Altered mental status.  COPD.  Hypertension.  CHEST - 2 VIEW  Comparison: 01/12/2013  Findings: The cardiac silhouette is mildly enlarged.  A coronary artery stent is noted on the lateral view.  No mediastinal or hilar masses.  There is interstitial thickening that is stable, and central vascular prominence without overt pulmonary edema, also stable.  No evidence of an infiltrate.  No pleural effusion or pneumothorax.  The bony thorax is intact.  IMPRESSION: No acute cardiopulmonary disease.  Stable appearance from the prior study.   Original Report Authenticated By: Amie Portland, M.D.   Ct Head Wo Contrast  02/17/2013   *RADIOLOGY REPORT*  Clinical Data:  Altered mental status, fall  CT HEAD WITHOUT CONTRAST CT CERVICAL SPINE WITHOUT CONTRAST  Technique:  Multidetector CT imaging of the head and cervical spine was performed following the standard protocol without intravenous contrast.  Multiplanar CT image reconstructions of the cervical spine were also generated.  Comparison:  08/27/2012 head CT  CT HEAD  Findings: Prominence of the sulci, cisterns, and ventricles, in keeping with volume loss. There are subcortical and periventricular white matter hypodensities, a nonspecific finding most often  seen with chronic microangiopathic changes.  There is no evidence for acute hemorrhage, overt hydrocephalus, mass lesion, or abnormal extra-axial fluid collection.  No definite CT evidence for acute cortical based (large artery) infarction. Scattered atherosclerotic disease.  Opacified right maxillary sinus and partially opacified ethmoid air cells.  The visualized paranasal sinuses and mastoid air cells are otherwise predominately clear. No displaced calvarial fracture.  IMPRESSION: Volume loss white matter changes are similar to prior.  No CT evidence for acute intracranial abnormality.  Partially opacified paranasal sinuses.  Correlate clinically for acute sinusitis.  CT CERVICAL SPINE  Findings: Biapical scarring.  Indeterminate left thyroid lobe nodule measures approximately 1 cm.  Atherosclerotic vascular calcifications.  Maintained craniocervical relationship.  No dens fracture.  Degenerative disc disease is most pronounced at C5-6, with disc osteophyte complex resulting in mild central canal narrowing. Minimal retrolisthesis of C5 and C6 and anterolisthesis of C6 on C7.  No displaced acute fracture or malalignment. Paravertebral soft tissues within normal range.  IMPRESSION: C5-6 degenerative changes without acute osseous finding.  1 cm left thyroid lobe nodule can be further evaluated with a non emergent ultrasound follow-up.   Original Report Authenticated By: Jearld Lesch, M.D.   Ct Cervical Spine Wo Contrast  02/17/2013   *RADIOLOGY REPORT*  Clinical Data:  Altered mental status, fall  CT HEAD WITHOUT CONTRAST CT CERVICAL SPINE WITHOUT CONTRAST  Technique:  Multidetector CT imaging of the head and cervical spine was performed following the standard protocol without intravenous contrast.  Multiplanar CT image reconstructions of the cervical spine were also generated.  Comparison:  08/27/2012 head CT  CT HEAD  Findings: Prominence of the sulci, cisterns, and ventricles, in keeping with volume loss.  There are subcortical and periventricular white matter hypodensities, a nonspecific finding most often seen with chronic microangiopathic changes.  There is no evidence for acute hemorrhage, overt hydrocephalus, mass lesion, or abnormal extra-axial fluid collection.  No definite CT evidence for acute cortical based (large artery) infarction. Scattered atherosclerotic disease.  Opacified right maxillary sinus and partially opacified ethmoid air cells.  The visualized paranasal sinuses and mastoid air cells are otherwise predominately clear. No displaced calvarial fracture.  IMPRESSION: Volume loss white matter changes are similar to prior.  No CT evidence for acute intracranial abnormality.  Partially opacified  paranasal sinuses.  Correlate clinically for acute sinusitis.  CT CERVICAL SPINE  Findings: Biapical scarring.  Indeterminate left thyroid lobe nodule measures approximately 1 cm.  Atherosclerotic vascular calcifications.  Maintained craniocervical relationship.  No dens fracture.  Degenerative disc disease is most pronounced at C5-6, with disc osteophyte complex resulting in mild central canal narrowing. Minimal retrolisthesis of C5 and C6 and anterolisthesis of C6 on C7.  No displaced acute fracture or malalignment. Paravertebral soft tissues within normal range.  IMPRESSION: C5-6 degenerative changes without acute osseous finding.  1 cm left thyroid lobe nodule can be further evaluated with a non emergent ultrasound follow-up.   Original Report Authenticated By: Jearld Lesch, M.D.   Dg Chest Port 1 View  02/19/2013   *RADIOLOGY REPORT*  Clinical Data: New onset of fever.  PORTABLE CHEST - 1 VIEW  Comparison: Chest radiograph performed 02/17/2013  Findings: The lungs are well-aerated.  Vascular congestion is noted, without definite evidence of pulmonary edema.  There is no evidence of focal opacification, pleural effusion or pneumothorax.  The cardiomediastinal silhouette is borderline enlarged.  No  acute osseous abnormalities are seen.  IMPRESSION: Vascular congestion and borderline cardiomegaly, without definite evidence of pulmonary edema.   Original Report Authenticated By: Tonia Ghent, M.D.   Chest xray viewed  By me  No diagnosis found.   2 PM I spoke with patient's son and daughter a lot who arrived in the emergency department. They state that he's been unable to eat without assistance for the past 2-3 days. He had temperature 101.4 this morning. Patient's son states he is power of attorney and wishes patient to be a DO NOT RESUSCITATE CODE STATUS and wishes hospice to be involved. MDM  Spoke with Dr.Dhungel plan admit to medical surgical floor Dr. Gonzella Lex will speak with pt regarding specific   theraputic measures to be taken Diagnosis #1 febrile illness #2 leukocytosis 3 renal insufficiency #4 dehydration #5 hypoxia       Doug Sou, MD 02/26/13 1714

## 2013-02-26 NOTE — ED Provider Notes (Signed)
Medical screening examination/treatment/procedure(s) were performed by non-physician practitioner and as supervising physician I was immediately available for consultation/collaboration.  Liridona Mashaw R. Joyel Chenette, MD 02/26/13 0721 

## 2013-02-26 NOTE — ED Notes (Signed)
Pt has as hx of tia and has not been acting normal for a few days, pt has been having fevers, sob, confusion, pta solumerdrol 125mg , and albuterol and atrovent neb, 20g lt ac iv,

## 2013-02-26 NOTE — ED Notes (Signed)
Pt's family at bedside, inquiring about DNR order. Dr Ethelda Chick notified and will follow up with pt and family.

## 2013-02-26 NOTE — H&P (Signed)
[facility] History and Physical  Terry Flynn Sr. ZOX:[id number] DOB: [birth year] DOA: 02/26/2013  Referring physician: Everlene Other PCP: [name], MD   Chief Complaint:  Altered mental status x 1 day  HPI:  79 year old male with history of advanced dementia who was recently hospitalized for frequent falls and failure to thrive, UTI and altered on chronic CKD. Patient was discharged home last week however the daughter-in-law reports that he has slowly started to decline again and we've some low-grade fever 2 days later. Her possibility he has been poorly responsive, has appetite. She also noted patient to have a fever upon 101.4 this morning. Patient brought to the ED by EMS and was lethargic with poor  Responsiveness. noted for low-grade temperature in the ED. Noted for some drop in 02 sat. ABG done showed po2 of 53. Labs showed leukocytosis and acute kidney injury. Son and daughter-in-law at bedside who recommened patient to be made comfort care given his progressive functional decline. After detailed discussion they wished patient to be made hospice and comfort care. They do not wish for further testing, imaging or  Antibiotics. At baseline he is poorly communicative, bedbound and needs assist will all his ADLs.   Review of Systems: limited  Due to severe dementia Constitutional: fever, chills  Poor appetite and fatigue.  HEENT: no URI symptoms ,   Respiratory:  SOB, no cough, chest tightness,  and wheezing.   Cardiovascular: no chest pain and leg swelling.  Gastrointestinal: no  nausea, vomiting, abdominal pain, diarrhea, constipation, blood in stool and abdominal distention.  Genitourinary: no  dysuria, urgency, frequency, hematuria, flank pain and difficulty urinating.  Skin: no pallor, rash and wound.  Neurological: no seizures, syncope,     Past Medical History  Diagnosis Date  . Back pain     DJD  . Hypertension   . Arteriosclerotic cardiovascular disease  (ASCVD)     stent to RCA Feb 10, 1996 and 02/10/03  . COPD (chronic obstructive pulmonary disease)   . GERD (gastroesophageal reflux disease)   . Headache(784.0)     chronic  . Chronic renal insufficiency     baseline Creatinine-1.4  . Anxiety and depression 1996    Chronic since wife's death in 1995/02/10  . Cognitive decline Feb 09, 2010    progressive since 2010/02/09, question of dementia  . Hyperlipidemia   . Cholelithiasis     Incidentally noted  . Heart attack     x2  . Stroke July 2013   Past Surgical History  Procedure Laterality Date  . Coronary stent placement  1997    RCA  . Coronary stent placement  2003/02/10    in-stent restenosis, stent replaced RCA.    . Colonoscopy  10/11/2012    Procedure: COLONOSCOPY;  Surgeon: Malissa Hippo, MD;  Location: [facility];  Service: Endoscopy;  Laterality: N/A;  100   Social History:  reports that he quit smoking about 11 months ago. His smoking use included Cigarettes. He has a 50 pack-year smoking history. He has never used smokeless tobacco. He reports that he does not drink alcohol or use illicit drugs.  Allergies  Allergen Reactions  . Statins Other (See Comments)    REACTION: MYALGIA    Family History  Problem Relation Age of Onset  . Throat cancer Father     Smoker  . Heart disease Brother   . Asthma Son   . Heart disease Brother     Prior to Admission medications   Medication Sig Start Date  End Date Taking? Authorizing Provider  albuterol (PROVENTIL HFA;VENTOLIN HFA) 108 (90 BASE) MCG/ACT inhaler Inhale 2 puffs into the lungs every 6 (six) hours as needed for wheezing. 01/16/13  Yes Hollice Espy, MD  amLODipine (NORVASC) 10 MG tablet Take 10 mg by mouth every morning.    Yes Historical Provider, MD  clonazePAM (KLONOPIN) 0.5 MG tablet Take 1 tablet (0.5 mg total) by mouth 2 (two) times daily as needed for anxiety. 01/16/13  Yes Hollice Espy, MD  clopidogrel (PLAVIX) 75 MG tablet Take 75 mg by mouth every evening.    Yes Historical  Provider, MD  Febuxostat (ULORIC) 80 MG TABS Take 80 mg by mouth every morning.    Yes Historical Provider, MD  hydrochlorothiazide (MICROZIDE) 12.5 MG capsule Take 12.5 mg by mouth every morning.   Yes Historical Provider, MD  Ipratropium-Albuterol (COMBIVENT) 20-100 MCG/ACT AERS respimat Inhale 2 puffs into the lungs 3 (three) times daily. 05/16/12  Yes Kathlen Brunswick, MD  Memantine HCl ER (NAMENDA XR) 28 MG CP24 Take 1 tablet by mouth every evening.   Yes Historical Provider, MD  PARoxetine (PAXIL) 40 MG tablet Take 40 mg by mouth every morning.   Yes Historical Provider, MD  risperiDONE (RISPERDAL) 0.5 MG tablet Take 0.5 mg by mouth at bedtime. 08/29/12  Yes Meredeth Ide, MD  rivastigmine (EXELON) 4.6 mg/24hr Place 1 patch onto the skin daily.   Yes Historical Provider, MD  tamsulosin (FLOMAX) 0.4 MG CAPS Take 0.4 mg by mouth every morning. 03/28/12  Yes Christina P Rama, MD  traZODone (DESYREL) 50 MG tablet Take 50 mg by mouth at bedtime.   Yes Historical Provider, MD    Physical Exam:  Filed Vitals:   02/26/13 1131 02/26/13 1239 02/26/13 1242  BP: 126/66  104/60  Pulse: 89  82  Temp: 99.4 F (37.4 C)    TempSrc: Rectal    Resp: 22  18  SpO2: 95% 87% 94%    Constitutional: Vital signs reviewed. Elderly male lying in bed poor responsive. Opens eye to commands only.  HEENT: Pupils reactive bilaterally, no pallor, poor dentition, dry oral mucosa, no icterus Cardiovascular: RRR, S1 normal, S2 normal, no MRG, pulses symmetric and intact bilaterally Pulmonary/Chest: CTAB, no wheezes, rales, or rhonchi Abdominal: Soft. Non-tender, non-distended, bowel sounds are normal, no masses, organomegaly, or guarding present.  Musculoskeletal: Warm, no edema, foley in place  pulses palpable bilaterally Neurological: A&O x0, responding only to verbal stimuli with eye opening. Normal tone and reflexes bilaterally    Labs on Admission:  Basic Metabolic Panel:  Recent Labs Lab 02/26/13 1208   NA 139  K 4.2  CL 102  CO2 28  GLUCOSE 113*  BUN 26*  CREATININE 2.04*  CALCIUM 9.5   Liver Function Tests:  Recent Labs Lab 02/26/13 1208  AST 11  ALT 8  ALKPHOS 98  BILITOT 0.2*  PROT 7.9  ALBUMIN 3.4*   No results found for this basename: LIPASE, AMYLASE,  in the last 168 hours No results found for this basename: AMMONIA,  in the last 168 hours CBC:  Recent Labs Lab 02/26/13 1208  WBC 15.7*  NEUTROABS 12.0*  HGB 12.8*  HCT 40.7  MCV 87.2  PLT 257   Cardiac Enzymes: No results found for this basename: CKTOTAL, CKMB, CKMBINDEX, TROPONINI,  in the last 168 hours BNP: No components found with this basename: POCBNP,  CBG: No results found for this basename: GLUCAP,  in the last 168 hours  Radiological  Exams on Admission: Dg Chest 2 View  02/26/2013   *RADIOLOGY REPORT*  Clinical Data: Altered mental status, fever and shortness of breath  CHEST - 2 VIEW  Comparison: 02/18/2013 and multiple prior chest radiographs  Findings: Cardiomegaly and peribronchial thickening again noted. This is a mildly low volume film. Mild bibasilar atelectasis is identified. There is no evidence of focal airspace disease, pulmonary edema, suspicious pulmonary nodule/mass, pleural effusion, or pneumothorax. No acute bony abnormalities are identified.  IMPRESSION: Cardiomegaly with mild bibasilar atelectasis.   Original Report Authenticated By: Harmon Pier, M.D.     Assessment/Plan Principal Problem:   Altered mental status no clear etiology. Differential includes dehydration, advanced dementia, stroke seizure or underlying infection. Admit under observation. Family wish no further w/up or resuscitation. Do not wish for antibiotics as well and want patient to be comfortable and made hospice. IV fluids. Prn morphine for comfort. Ativan prn for anxiety. Social work consulted for hospice.   Active Problems:  AKI on CKD Secondary to dehydration and poor po intake continue IV  fluids  Advanced dementia  HTN  hold BP meds    Diet: NPO  Code Status:DNR, comfort measures only Family Communication: son and daughter in law at bedside Disposition Plan: hospice ( home vs residential)  Eddie North [facility] Pager [phone]  If 7PM-7AM, please contact night-coverage [email or web address] Password [facility] 02/26/2013, 3:28 PM  total time spent: 55 minutes

## 2013-02-26 NOTE — Progress Notes (Signed)
02/20/2013 Spoke with pt's daughter-in-law and informed her that pt's blood culture was positive and that pt needed to go back to the hospital for treatment. She [name]) verbalized understanding. Received call from Dr Radonna Ricker with the [facility] pt does not need to be hospitalized, this is a false positive result. Informed [facility]

## 2013-02-27 DIAGNOSIS — R0609 Other forms of dyspnea: Secondary | ICD-10-CM

## 2013-02-27 DIAGNOSIS — R0600 Dyspnea, unspecified: Secondary | ICD-10-CM

## 2013-02-27 DIAGNOSIS — R531 Weakness: Secondary | ICD-10-CM

## 2013-02-27 DIAGNOSIS — R52 Pain, unspecified: Secondary | ICD-10-CM

## 2013-02-27 DIAGNOSIS — R627 Adult failure to thrive: Secondary | ICD-10-CM | POA: Diagnosis present

## 2013-02-27 DIAGNOSIS — R0989 Other specified symptoms and signs involving the circulatory and respiratory systems: Secondary | ICD-10-CM

## 2013-02-27 DIAGNOSIS — Z515 Encounter for palliative care: Secondary | ICD-10-CM

## 2013-02-27 DIAGNOSIS — R509 Fever, unspecified: Secondary | ICD-10-CM

## 2013-02-27 LAB — BASIC METABOLIC PANEL
CO2: 27 mEq/L (ref 19–32)
Chloride: 104 mEq/L (ref 96–112)
Creatinine, Ser: 1.58 mg/dL — ABNORMAL HIGH (ref 0.50–1.35)
GFR calc Af Amer: 47 mL/min — ABNORMAL LOW (ref >90)
Potassium: 4.2 mEq/L (ref 3.5–5.1)
Sodium: 137 mEq/L (ref 135–145)

## 2013-02-27 LAB — CBC
MCV: 86.1 fL (ref 78.0–100.0)
Platelets: 251 10*3/uL (ref 150–400)
RBC: 4.17 MIL/uL — ABNORMAL LOW (ref 4.22–5.81)
RDW: 14.1 % (ref 11.5–15.5)
WBC: 19.4 10*3/uL — ABNORMAL HIGH (ref 4.0–10.5)

## 2013-02-27 MED ORDER — LORAZEPAM 1 MG PO TABS: 1.0000 mg | ORAL_TABLET | Freq: Four times a day (QID) | ORAL | Status: AC | PRN

## 2013-02-27 MED ORDER — ALBUTEROL SULFATE (5 MG/ML) 0.5% IN NEBU
5.0000 mg | INHALATION_SOLUTION | Freq: Once | RESPIRATORY_TRACT | Status: AC
  Administered 2013-02-27: 5 mg via RESPIRATORY_TRACT
  Filled 2013-02-27: qty 0.5

## 2013-02-27 MED ORDER — ALBUTEROL SULFATE (5 MG/ML) 0.5% IN NEBU: 5.0000 mg | INHALATION_SOLUTION | RESPIRATORY_TRACT | Status: DC | PRN

## 2013-02-27 MED ORDER — IPRATROPIUM BROMIDE 0.02 % IN SOLN
0.5000 mg | Freq: Once | RESPIRATORY_TRACT | Status: AC
  Administered 2013-02-27: 0.5 mg via RESPIRATORY_TRACT
  Filled 2013-02-27: qty 2.5

## 2013-02-27 MED ORDER — LORAZEPAM 1 MG PO TABS: 1.0000 mg | ORAL_TABLET | Freq: Four times a day (QID) | ORAL | Status: DC | PRN

## 2013-02-27 MED ORDER — MORPHINE SULFATE (CONCENTRATE) 10 MG /0.5 ML PO SOLN: 5.0000 mg | ORAL | Status: DC | PRN

## 2013-02-27 MED ORDER — MORPHINE SULFATE (CONCENTRATE) 10 MG /0.5 ML PO SOLN: 5.0000 mg | ORAL | Status: AC | PRN

## 2013-02-27 NOTE — Progress Notes (Signed)
Pt to be d/c home today via [facility] transport. [employer] has been contacted and insurance has provided authorization ( # [id number] )  for transport. Nsg will contact [facility] when pt is ready for d/c home.  Cori Razor LCSW [phone]

## 2013-02-27 NOTE — Progress Notes (Addendum)
Clinical Social Work Department BRIEF PSYCHOSOCIAL ASSESSMENT 02/27/2013 Late entry for 02/26/2013  Patient:  Terry Flynn, Terry Flynn     Account Number:  [id number]     Admit date:  02/01/2013  Clinical Social Worker:  Doree Albee  Date/Time:  02/26/2013 05:30 PM  Referred by:  CSW  Date Referred:  02/27/2013  Other Referral:   Interview type:   Other interview type:    PSYCHOSOCIAL DATA Living Status:  FAMILY Admitted from facility:   Level of care:   Primary support name:  Daryl Eastern Primary support relationship to patient:  CHILD, ADULT Degree of support available:   and hcpoa    CURRENT CONCERNS Current Concerns  Post-Acute Placement   Other Concerns:    SOCIAL WORK ASSESSMENT / PLAN CSW met with pt son and pt caregiver. Patient has history of dementia and unable to engage in assessment. Patient son shared the patient lives at home with pt son, and has 24/7 caregivers. Patient caregiver and son shared they may be interested in Hospice. CSW and pt family discuss palliative care and requested hosptialist to consider palliative care consult. At this time pt family hopes to have patient return home with hospice care in addition to 24/7 caregivers   Assessment/plan status:  Psychosocial Support/Ongoing Assessment of Needs Other assessment/ plan:   Information/referral to community resources:   none identified at this time    PATIENT'S/FAMILY'S RESPONSE TO PLAN OF CARE: Pt family thanked csw for concern and support. Patient family requested palliative care consult to determine pt disposition needs of residential hospice vs. home with hospice.

## 2013-02-27 NOTE — Progress Notes (Signed)
Patient awake and responding very well and appropriately.  Patient also noted to be having increasing expiratory wheezing but denies feeling short of breath.  Merdis Delay, NP on call and new orders given for PRN respiratory treatments.  Will continue to monitor.

## 2013-02-27 NOTE — Discharge Summary (Signed)
Physician Discharge Summary  Terry Flynn. ZOX:[id number] DOB: 1933-01-28 DOA: 02/26/2013  PCP: [name], MD  Admit date: 02/26/2013 Discharge date: 02/27/2013  Time spent: 25  minutes  Recommendations for Outpatient Follow-up:  1. Home with hospice.   Discharge Diagnoses:   Principal Problem:   Altered mental status with possible underlying  Infection  Active Problems:   Progressive Cognitive decline   Acute on CKD (chronic kidney disease) stage 3, GFR 30-59 ml/min   Acute respiratory failure with hypoxia   Dementia   Fever, unspecified   Dehydration   FTT (failure to thrive) in adult   Discharge Condition: poor  Diet recommendation: regular  Filed Weights   02/26/13 1545  Weight: 94.9 kg (209 lb 3.5 oz)    History of present illness:  Please refer to admission H&P for details, but in brief, 79 year old male with history of advanced dementia who was recently hospitalized for frequent falls and failure to thrive, UTI and altered on chronic CKD. Patient was discharged home last week however the daughter-in-law reports that he has slowly started to decline again and we've some low-grade fever 2 days later.  As per daughter in law, he has been poorly responsive, has poor appetite. She also noted patient to have a fever of  101.4 this morning. Patient brought to the ED by EMS and was lethargic with poor  Responsiveness. noted for low-grade temperature in the ED. Noted for some drop in 02 sat. ABG done showed po2 of 53. Labs showed leukocytosis and acute kidney injury. Son and daughter-in-law at bedside who recommened patient to be made comfort care given his progressive functional decline. After detailed discussion they wished patient to be made hospice and comfort care. They do not wish for further testing, imaging or Antibiotics.  At baseline he is poorly communicative, bedbound and needs assist will all his ADLs.   In the ED patient was lethargic with poor  responsiveness.    Hospital Course:   Altered mental status  Possibly related to underlying infection, progressive dementia, failure to thrive and dehydration. Son and daughter-in-law at bedside who wish no further w/up or resuscitation given the patient's progressive functional decline in her current hospitalization. Do not wish for antibiotics as well and want patient to be comfortable and made hospice.  Placed on IV fluids. Prn morphine for comfort. Ativan prn for anxiety.  Dilated care team consulted. This morning patient is more awake and seems to be at baseline. Palliative care  discussed with family and agreed upon patient being discharged home with hospice and residential hospice placement.  i will discontinue most of his home medications. discharge him on a when necessary and Roxanol for pain and comfort, when necessary Ativan  And home oxygen via nasal cannula. I would continue his exelon patch.   Active Problems:  AKI on CKD  Secondary to dehydration and poor po intake  Given IV fluids with some improvement  Leucocytosis with fever  Recent hospitalization for UTI. Further workup not done as family request him to be made comfort care.   Diet: regular Code Status:DNR, comfort measures only  Family Communication: spoke with son over the phone   Procedures: None  Consultations:  Palliative care  Discharge Exam: Filed Vitals:   02/26/13 1545 02/26/13 2040 02/27/13 0355 02/27/13 0504  BP: 112/59 121/61  122/60  Pulse: 79 68  59  Temp: 98.6 F (37 C) 97.8 F (36.6 C)  97.5 F (36.4 C)  TempSrc: Axillary Axillary  Axillary  Resp: 16 16  16   Height: 5\' 7"  (1.702 m)     Weight: 94.9 kg (209 lb 3.5 oz)     SpO2: 96% 95% 90% 93%    General: elderly male in NAD HEENT: no pallor , poor dentition, available mucosa CVS: Normal S1 and S2, no murmurs Chest: Clear to auscultation bilaterally few rhonchi Abdomen: Soft, nontender, nondistended, bowel sounds  present Extremities: Warm, no edema CNS: AAO x1-2, non focal    Discharge Instructions   Future Appointments Provider Department Dept Phone   03/21/2013 2:00 PM Ernestina Penna, MD [facility] [phone]       Medication List    STOP taking these medications       albuterol 108 (90 BASE) MCG/ACT inhaler  Commonly known as:  PROVENTIL HFA;VENTOLIN HFA     amLODipine 10 MG tablet  Commonly known as:  NORVASC     ciprofloxacin 500 MG tablet  Commonly known as:  CIPRO     clonazePAM 0.5 MG tablet  Commonly known as:  KLONOPIN     clopidogrel 75 MG tablet  Commonly known as:  PLAVIX     hydrochlorothiazide 12.5 MG capsule  Commonly known as:  MICROZIDE     NAMENDA XR 28 MG Cp24  Generic drug:  Memantine HCl ER     PARoxetine 40 MG tablet  Commonly known as:  PAXIL     risperiDONE 0.5 MG tablet  Commonly known as:  RISPERDAL     tamsulosin 0.4 MG Caps  Commonly known as:  FLOMAX     traZODone 50 MG tablet  Commonly known as:  DESYREL     ULORIC 80 MG Tabs  Generic drug:  Febuxostat      TAKE these medications       Ipratropium-Albuterol 20-100 MCG/ACT Aers respimat  Commonly known as:  COMBIVENT  Inhale 2 puffs into the lungs 3 (three) times daily.     LORazepam 1 MG tablet  Commonly known as:  ATIVAN  Take 1 tablet (1 mg total) by mouth every 6 (six) hours as needed for anxiety.     morphine CONCENTRATE 10 mg / 0.5 ml concentrated solution  Take 0.25 mLs (5 mg total) by mouth every 2 (two) hours as needed.     rivastigmine 4.6 mg/24hr  Commonly known as:  EXELON  Place 1 patch onto the skin daily.       Allergies  Allergen Reactions  . Statins Other (See Comments)    REACTION: MYALGIA       Follow-up Information   Please follow up.   Contact information:   Refer to residential hospice       The results of significant diagnostics from this hospitalization (including imaging, microbiology, ancillary and laboratory)  are listed below for reference.    Significant Diagnostic Studies: Dg Chest 2 View  02/26/2013   *RADIOLOGY REPORT*  Clinical Data: Altered mental status, fever and shortness of breath  CHEST - 2 VIEW  Comparison: 02/18/2013 and multiple prior chest radiographs  Findings: Cardiomegaly and peribronchial thickening again noted. This is a mildly low volume film. Mild bibasilar atelectasis is identified. There is no evidence of focal airspace disease, pulmonary edema, suspicious pulmonary nodule/mass, pleural effusion, or pneumothorax. No acute bony abnormalities are identified.  IMPRESSION: Cardiomegaly with mild bibasilar atelectasis.   Original Report Authenticated By: Harmon Pier, M.D.   Dg Chest 2 View  02/17/2013   *RADIOLOGY REPORT* the  Clinical Data: Altered mental status.  COPD.  Hypertension.  CHEST - 2 VIEW  Comparison: 01/12/2013  Findings: The cardiac silhouette is mildly enlarged.  A coronary artery stent is noted on the lateral view.  No mediastinal or hilar masses.  There is interstitial thickening that is stable, and central vascular prominence without overt pulmonary edema, also stable.  No evidence of an infiltrate.  No pleural effusion or pneumothorax.  The bony thorax is intact.  IMPRESSION: No acute cardiopulmonary disease.  Stable appearance from the prior study.   Original Report Authenticated By: Amie Portland, M.D.   Ct Head Wo Contrast  02/17/2013   *RADIOLOGY REPORT*  Clinical Data:  Altered mental status, fall  CT HEAD WITHOUT CONTRAST CT CERVICAL SPINE WITHOUT CONTRAST  Technique:  Multidetector CT imaging of the head and cervical spine was performed following the standard protocol without intravenous contrast.  Multiplanar CT image reconstructions of the cervical spine were also generated.  Comparison:  08/27/2012 head CT  CT HEAD  Findings: Prominence of the sulci, cisterns, and ventricles, in keeping with volume loss. There are subcortical and periventricular white matter  hypodensities, a nonspecific finding most often seen with chronic microangiopathic changes.  There is no evidence for acute hemorrhage, overt hydrocephalus, mass lesion, or abnormal extra-axial fluid collection.  No definite CT evidence for acute cortical based (large artery) infarction. Scattered atherosclerotic disease.  Opacified right maxillary sinus and partially opacified ethmoid air cells.  The visualized paranasal sinuses and mastoid air cells are otherwise predominately clear. No displaced calvarial fracture.  IMPRESSION: Volume loss white matter changes are similar to prior.  No CT evidence for acute intracranial abnormality.  Partially opacified paranasal sinuses.  Correlate clinically for acute sinusitis.  CT CERVICAL SPINE  Findings: Biapical scarring.  Indeterminate left thyroid lobe nodule measures approximately 1 cm.  Atherosclerotic vascular calcifications.  Maintained craniocervical relationship.  No dens fracture.  Degenerative disc disease is most pronounced at C5-6, with disc osteophyte complex resulting in mild central canal narrowing. Minimal retrolisthesis of C5 and C6 and anterolisthesis of C6 on C7.  No displaced acute fracture or malalignment. Paravertebral soft tissues within normal range.  IMPRESSION: C5-6 degenerative changes without acute osseous finding.  1 cm left thyroid lobe nodule can be further evaluated with a non emergent ultrasound follow-up.   Original Report Authenticated By: Jearld Lesch, M.D.   Ct Cervical Spine Wo Contrast  02/17/2013   *RADIOLOGY REPORT*  Clinical Data:  Altered mental status, fall  CT HEAD WITHOUT CONTRAST CT CERVICAL SPINE WITHOUT CONTRAST  Technique:  Multidetector CT imaging of the head and cervical spine was performed following the standard protocol without intravenous contrast.  Multiplanar CT image reconstructions of the cervical spine were also generated.  Comparison:  08/27/2012 head CT  CT HEAD  Findings: Prominence of the sulci,  cisterns, and ventricles, in keeping with volume loss. There are subcortical and periventricular white matter hypodensities, a nonspecific finding most often seen with chronic microangiopathic changes.  There is no evidence for acute hemorrhage, overt hydrocephalus, mass lesion, or abnormal extra-axial fluid collection.  No definite CT evidence for acute cortical based (large artery) infarction. Scattered atherosclerotic disease.  Opacified right maxillary sinus and partially opacified ethmoid air cells.  The visualized paranasal sinuses and mastoid air cells are otherwise predominately clear. No displaced calvarial fracture.  IMPRESSION: Volume loss white matter changes are similar to prior.  No CT evidence for acute intracranial abnormality.  Partially opacified paranasal sinuses.  Correlate clinically for acute sinusitis.  CT CERVICAL SPINE  Findings: Biapical scarring.  Indeterminate left thyroid lobe nodule measures approximately 1 cm.  Atherosclerotic vascular calcifications.  Maintained craniocervical relationship.  No dens fracture.  Degenerative disc disease is most pronounced at C5-6, with disc osteophyte complex resulting in mild central canal narrowing. Minimal retrolisthesis of C5 and C6 and anterolisthesis of C6 on C7.  No displaced acute fracture or malalignment. Paravertebral soft tissues within normal range.  IMPRESSION: C5-6 degenerative changes without acute osseous finding.  1 cm left thyroid lobe nodule can be further evaluated with a non emergent ultrasound follow-up.   Original Report Authenticated By: Jearld Lesch, M.D.   Dg Chest Port 1 View  02/19/2013   *RADIOLOGY REPORT*  Clinical Data: New onset of fever.  PORTABLE CHEST - 1 VIEW  Comparison: Chest radiograph performed 02/17/2013  Findings: The lungs are well-aerated.  Vascular congestion is noted, without definite evidence of pulmonary edema.  There is no evidence of focal opacification, pleural effusion or pneumothorax.  The  cardiomediastinal silhouette is borderline enlarged.  No acute osseous abnormalities are seen.  IMPRESSION: Vascular congestion and borderline cardiomegaly, without definite evidence of pulmonary edema.   Original Report Authenticated By: Tonia Ghent, M.D.    Microbiology: Recent Results (from the past 240 hour(s))  CULTURE, BLOOD (ROUTINE X 2)     Status: None   Collection Time    02/18/13 11:50 PM      Result Value Range Status   Specimen Description BLOOD RIGHT HAND   Final   Special Requests     Final   Value: BOTTLES DRAWN AEROBIC AND ANAEROBIC 2CC BOTH BOTTLES   Culture  Setup Time 02/19/2013 10:37   Final   Culture NO GROWTH 5 DAYS   Final   Report Status 02/25/2013 FINAL   Final  CULTURE, BLOOD (ROUTINE X 2)     Status: None   Collection Time    02/18/13 11:55 PM      Result Value Range Status   Specimen Description BLOOD LEFT HAND   Final   Special Requests     Final   Value: BOTTLES DRAWN AEROBIC AND ANAEROBIC 2CC BOTH BOTTLES   Culture  Setup Time 02/19/2013 10:37   Final   Culture     Final   Value: STAPHYLOCOCCUS SPECIES (COAGULASE NEGATIVE)     Note: THE SIGNIFICANCE OF ISOLATING THIS ORGANISM FROM A SINGLE SET OF BLOOD CULTURES WHEN MULTIPLE SETS ARE DRAWN IS UNCERTAIN. PLEASE NOTIFY THE MICROBIOLOGY DEPARTMENT WITHIN ONE WEEK IF SPECIATION AND SENSITIVITIES ARE REQUIRED.     Note: Gram Stain Report Called to,Read Back By and Verified With: KAY RUTHERFORD 02/20/13 1430 BY SMITHERSJ REPORT FAXED BY REQUEST   Report Status 02/21/2013 FINAL   Final  URINE CULTURE     Status: None   Collection Time    02/19/13  1:06 AM      Result Value Range Status   Specimen Description URINE, RANDOM   Final   Special Requests NONE   Final   Culture  Setup Time 02/19/2013 11:34   Final   Colony Count >=100,000 COLONIES/ML   Final   Culture PROTEUS MIRABILIS   Final   Report Status 02/20/2013 FINAL   Final   Organism ID, Bacteria PROTEUS MIRABILIS   Final     Labs: Basic  Metabolic Panel:  Recent Labs Lab 02/26/13 1208 02/27/13 0845  NA 139 137  K 4.2 4.2  CL 102 104  CO2 28 27  GLUCOSE 113* 161*  BUN 26* 27*  CREATININE 2.04* 1.58*  CALCIUM 9.5 8.7   Liver Function Tests:  Recent Labs Lab 02/26/13 1208  AST 11  ALT 8  ALKPHOS 98  BILITOT 0.2*  PROT 7.9  ALBUMIN 3.4*   No results found for this basename: LIPASE, AMYLASE,  in the last 168 hours No results found for this basename: AMMONIA,  in the last 168 hours CBC:  Recent Labs Lab 02/26/13 1208 02/27/13 0845  WBC 15.7* 19.4*  NEUTROABS 12.0*  --   HGB 12.8* 11.6*  HCT 40.7 35.9*  MCV 87.2 86.1  PLT 257 251   Cardiac Enzymes: No results found for this basename: CKTOTAL, CKMB, CKMBINDEX, TROPONINI,  in the last 168 hours BNP: BNP (last 3 results)  Recent Labs  08/27/12 1030 12/13/12 1946 01/12/13 1530  PROBNP 108.8 119.1 102.4   CBG: No results found for this basename: GLUCAP,  in the last 168 hours     Signed:  Fraser Busche  [facility] 02/27/2013, 1:05 PM

## 2013-02-27 NOTE — Care Management Note (Signed)
Cm consulted by Palliative Medicine to offer home hospice choice to pt and family for [facility]. Per pt's son Terry Flynn, [name] choice of home hospice is [facility]. CM spoke with [facility] intake rep Terry Flynn concerning referral. Cm faxed pt information to [facility] at [phone]. Confirmation received. Pt request home oxygen. Per [facility] Care to delivery to home around 2pm this afternoon. Per family pt has hospital bed and other dme present in home provided by [facility]. Per pt family, pt will require tx home. CSW notified. No other needs identified at this time.  Terry Manns Donnell Beauchamp,RN,BSN [id number]

## 2013-02-27 NOTE — Consult Note (Signed)
Patient Terry W Krikorian Sr.      DOB: [birth year]      ZHY:[id number]     Consult Note from the Palliative Medicine Team at [facility]    Consult Requested by: Dr Gonzella Lex     PCP: [name], MD Reason for Consultation:Clarification of GOC and options     Phone [phone]  Assessment of patients Current state:79 year old male with history of dementia who was recently hospitalized for frequent falls and failure to thrive, UTI and altered on chronic CKD. Patient was discharged home last week however the daughter-in-law reports that he has slowly started to decline again with some low-grade fever 2 days later. Patient brought to the ED by EMS and admitted --family has verbalized comfort as a priority, minimal medical interventions initiated--shift to full comfort.   Consult is for review of medical treatment options, clarification of goals of care and end of life issues, disposition and options, and symptom recommendation.  This NP Lorinda Creed reviewed medical records, received report from team, assessed the patient and then meet at the patient's bedside along with his son and stated HPOA Terry Flynn  to discuss diagnosis prognosis, GOC, EOL wishes disposition and options.   A detailed discussion was had today regarding advanced directives.  Concepts specific to code status, artifical feeding and hydration, continued IV antibiotics and rehospitalization was had.  The difference between a aggressive medical intervention path  and a palliative comfort care path for this patient at this time was had.  Values and goals of care important to patient and family were attempted to be elicited.  Concept of Hospice and Palliative Care were discussed  Natural trajectory and expectations at EOL were discussed.  Questions and concerns addressed. Family encouraged to call with questions or concerns.  PMT will continue to support holistically.  MOST form completed  Goals of Care: 1.  Code  Status:DNR/DNI   2. Scope of Treatment: 1. Vital Signs:per unit  2. Respiratory/Oxygen: for comfort 3. Nutritional Support/Tube Feeds: no artifical feeding now or in the future 4. Antibiotics:none 5. Blood Products:none 6. MWN:UUVO 7. Review of Medications to be discontinued:minimize for comfort only 8. Labs:none 9. Telemetry: none   4. Disposition: Home with hospice services of [place], when appropriate transition to residential hospice.  Will write for choice   3. Symptom Management:   1. Anxiety/Agitation: Ativan 1 mg every 6 hrs prn 2. Pain:/Dyspnea: Roxanol 5 mg every 2 hrs prn   4. Psychosocial:  Emotional support offered to son and his wife by telephone, this is a very difficult decision even though they believe they are honoring the patients wishes.    Patient Documents Completed or Given: Document Given Completed  Advanced Directives Pkt    MOST  yes  DNR  yes  Gone from My [facility]ight    Hard Choices      Brief HPI: 79 year old male with history of dementia who was recently hospitalized for frequent falls and failure to thrive, UTI and altered on chronic CKD. Patient was discharged home last week however the daughter-in-law reports that he has slowly started to decline again with some low-grade fever 2 days later. Patient brought to the ED by EMS and admitted --family has verbalized comfort as a priority, minimal medical interventions initiated--shift to full comfort.    ROS:  Right  leg pain, weakness,     PMH:  Past Medical History  Diagnosis Date  . Back pain     DJD  . Hypertension   .  Arteriosclerotic cardiovascular disease (ASCVD)     stent to RCA Feb 07, 1996 and 02-07-2003  . COPD (chronic obstructive pulmonary disease)   . GERD (gastroesophageal reflux disease)   . Headache(784.0)     chronic  . Chronic renal insufficiency     baseline Creatinine-1.4  . Anxiety and depression 1996    Chronic since wife's death in 02/07/95  . Cognitive decline Feb 06, 2010     progressive since Feb 06, 2010, question of dementia  . Hyperlipidemia   . Cholelithiasis     Incidentally noted  . Heart attack     x2  . Stroke July 2013     PSH: Past Surgical History  Procedure Laterality Date  . Coronary stent placement  1997    RCA  . Coronary stent placement  02-07-2003    in-stent restenosis, stent replaced RCA.    . Colonoscopy  10/11/2012    Procedure: COLONOSCOPY;  Surgeon: Malissa Hippo, MD;  Location: [facility];  Service: Endoscopy;  Laterality: N/A;  100   I have reviewed the FH and SH and  If appropriate update it with new information. Allergies  Allergen Reactions  . Statins Other (See Comments)    REACTION: MYALGIA   Scheduled Meds: . rivastigmine  4.6 mg Transdermal Daily   Continuous Infusions: . dextrose 5 % and 0.9% NaCl 75 mL/hr at 02/27/13 0609   PRN Meds:.acetaminophen, acetaminophen, albuterol, LORazepam, morphine injection    BP 122/60  Pulse 59  Temp(Src) 97.5 F (36.4 C) (Axillary)  Resp 16  Ht 5\' 7"  (1.702 m)  Wt 94.9 kg (209 lb 3.5 oz)  BMI 32.76 kg/m2  SpO2 93%   PPS: 20 %   Intake/Output Summary (Last 24 hours) at 02/27/13 1134 Last data filed at 02/27/13 1000  Gross per 24 hour  Intake 1156.25 ml  Output    700 ml  Net 456.25 ml    Physical Exam:  General: Chronically ill appearing, obese HEENT:  MM, no exudate Chest:   Diminished in bases, (encouraged to CDB) CVS: RRR Abdomen:obese, soft NT +BS Ext: without edema,  Neuro: oriented to person and place  Labs: CBC    Component Value Date/Time   WBC 19.4* 02/27/2013 0845   RBC 4.17* 02/27/2013 0845   HGB 11.6* 02/27/2013 0845   HCT 35.9* 02/27/2013 0845   PLT 251 02/27/2013 0845   MCV 86.1 02/27/2013 0845   MCH 27.8 02/27/2013 0845   MCHC 32.3 02/27/2013 0845   RDW 14.1 02/27/2013 0845   LYMPHSABS 2.4 02/26/2013 1208   MONOABS 1.2* 02/26/2013 1208   EOSABS 0.2 02/26/2013 1208   BASOSABS 0.0 02/26/2013 1208    BMET    Component Value Date/Time   NA 137  02/27/2013 0845   K 4.2 02/27/2013 0845   CL 104 02/27/2013 0845   CO2 27 02/27/2013 0845   GLUCOSE 161* 02/27/2013 0845   BUN 27* 02/27/2013 0845   CREATININE 1.58* 02/27/2013 0845   CREATININE 1.96* 07/03/2012 1055   CALCIUM 8.7 02/27/2013 0845   GFRNONAA 40* 02/27/2013 0845   GFRAA 47* 02/27/2013 0845    CMP     Component Value Date/Time   NA 137 02/27/2013 0845   K 4.2 02/27/2013 0845   CL 104 02/27/2013 0845   CO2 27 02/27/2013 0845   GLUCOSE 161* 02/27/2013 0845   BUN 27* 02/27/2013 0845   CREATININE 1.58* 02/27/2013 0845   CREATININE 1.96* 07/03/2012 1055   CALCIUM 8.7 02/27/2013 0845   PROT 7.9 02/26/2013 02-07-1207  ALBUMIN 3.4* 02/26/2013 1208   AST 11 02/26/2013 1208   ALT 8 02/26/2013 1208   ALKPHOS 98 02/26/2013 1208   BILITOT 0.2* 02/26/2013 1208   GFRNONAA 40* 02/27/2013 0845   GFRAA 47* 02/27/2013 0845      Time In Time Out Total Time Spent with Patient Total Overall Time  1130 1245 70 min 75 min    Greater than 50%  of this time was spent counseling and coordinating care related to the above assessment and plan.  Lorinda Creed NP  Palliative Medicine Team Team Phone # [phone] Pager [phone]  Discussed with Dr Gonzella Lex

## 2013-03-08 NOTE — Consult Note (Signed)
I have reviewed and discussed the care of this patient in detail with the nurse practitioner including pertinent patient records, physical exam findings and data. I agree with details of this encounter.  

## 2013-03-20 DEATH — deceased

## 2013-03-21 ENCOUNTER — Ambulatory Visit: Payer: Self-pay | Admitting: Family Medicine

## 2013-04-18 NOTE — Telephone Encounter (Signed)
x

## 2014-03-29 IMAGING — CT CT CHEST W/O CM
1 of 2 series · 14 of 32 positions shown, 18 images · non-contrast
Comparison: 01/12/2013 and prior chest radiographs

CLINICAL DATA: 80-year-old male with increasing shortness of breath
and hypoxia.

CT CHEST WITHOUT CONTRAST
TECHNIQUE: Multidetector CT imaging of the chest was performed
following the standard protocol without IV contrast. Intravenous
contrast was not administered secondary to renal insufficiency.

[Series 2: chest w/o st · axial · non-contrast · 0.80mm/px · z∈[-352,-102]mm · 14 of 60 slices shown, 18 images]
[im 5/60  mediastinal]
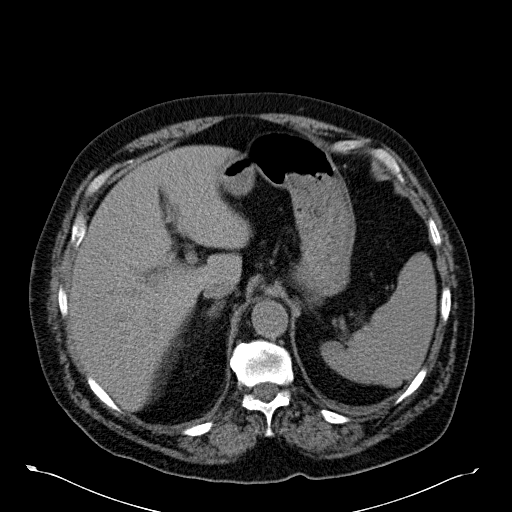
[im 5/60  lung]
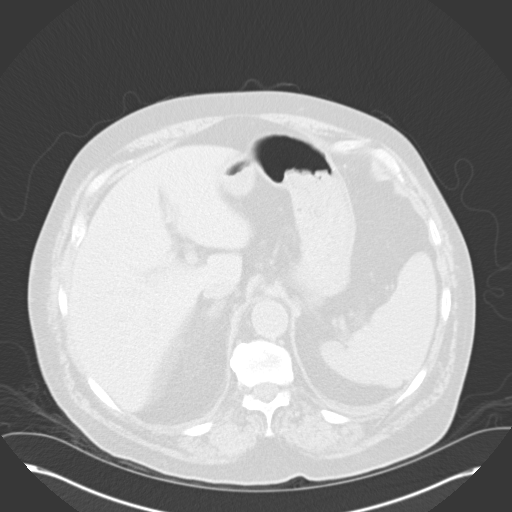
[im 10/60  lung]
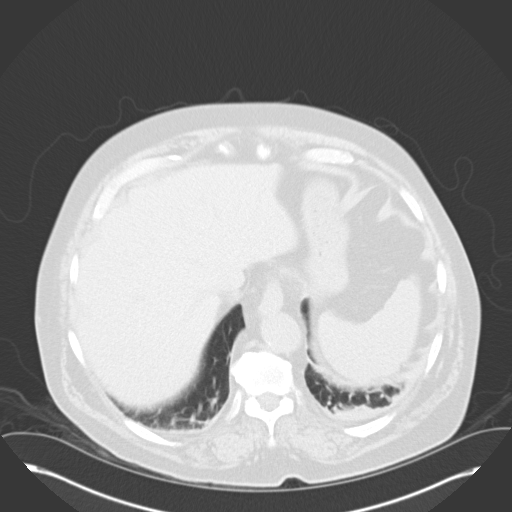
[im 14/60  lung]
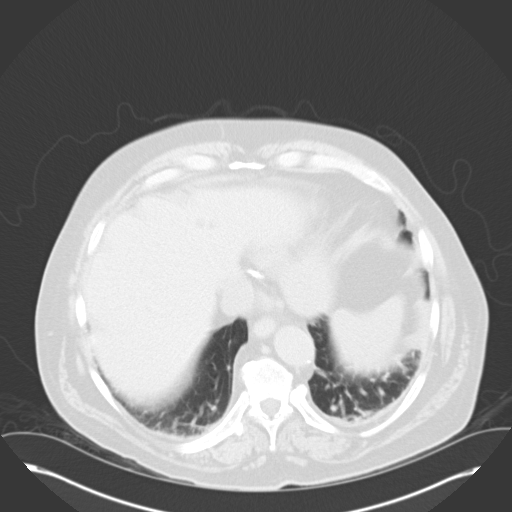
[im 19/60  lung]
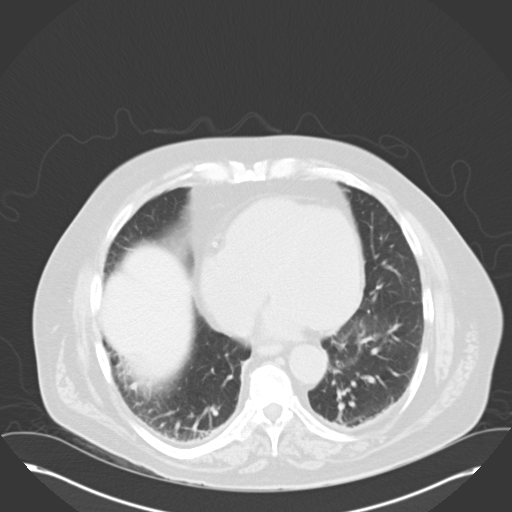
[im 23/60  mediastinal]
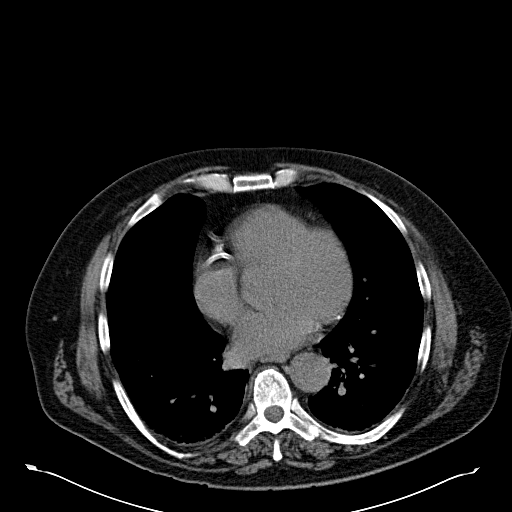
[im 23/60  lung]
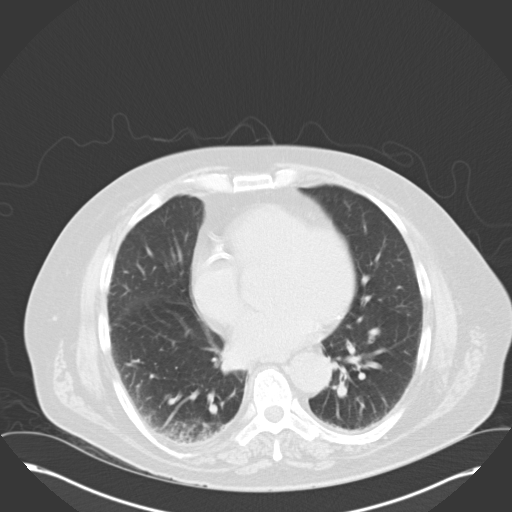
[im 28/60  lung]
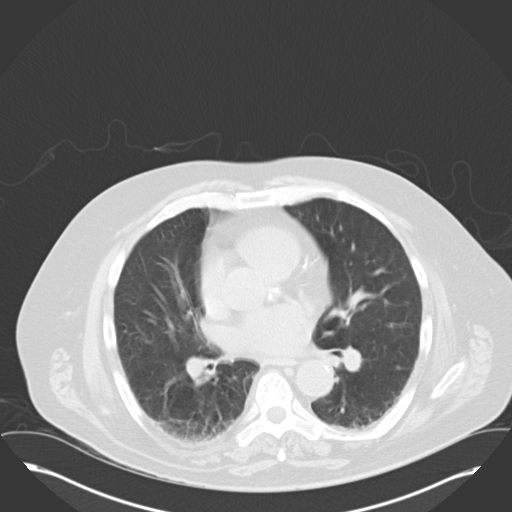
[im 29/60  lung]
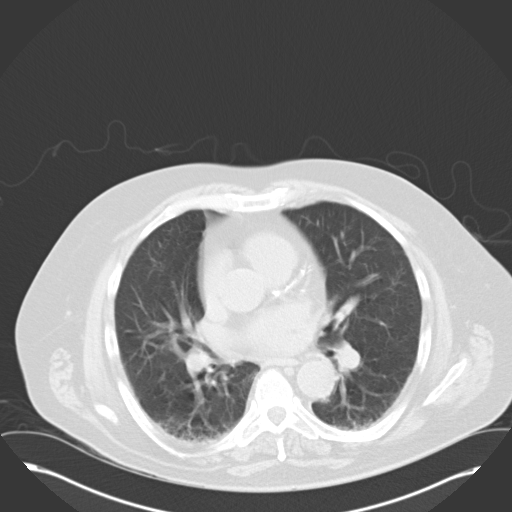
[im 30/60  lung]
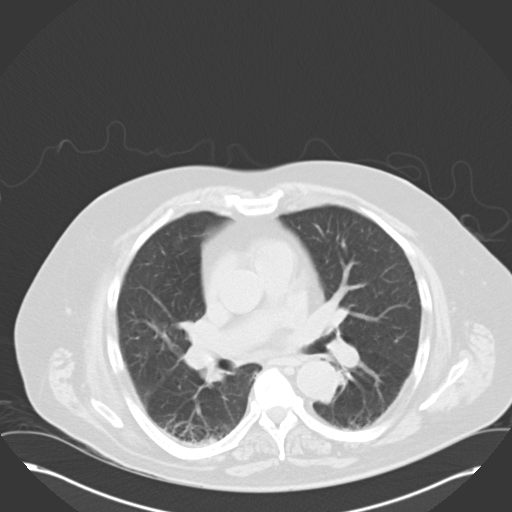
[im 32/60  mediastinal]
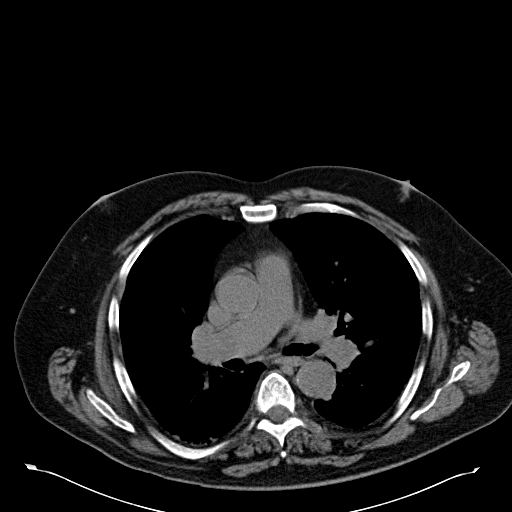
[im 32/60  lung]
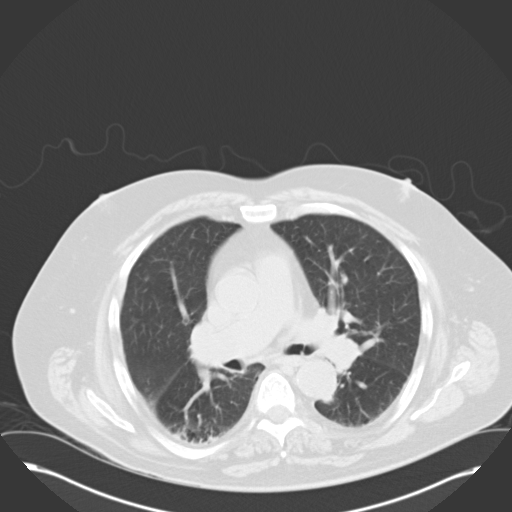
[im 37/60  lung]
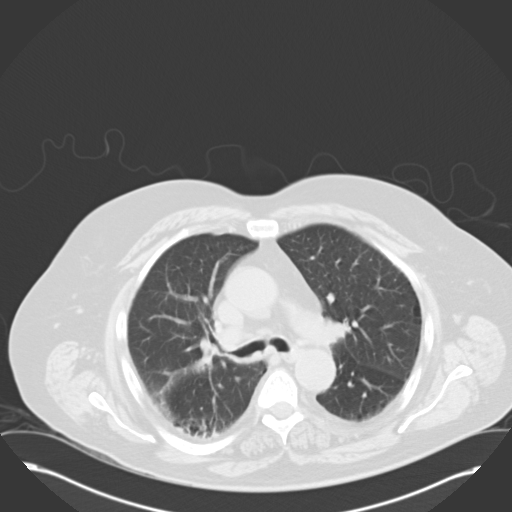
[im 41/60  lung]
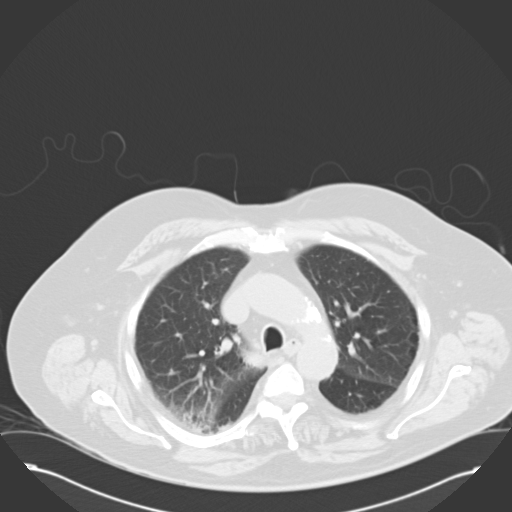
[im 46/60  lung]
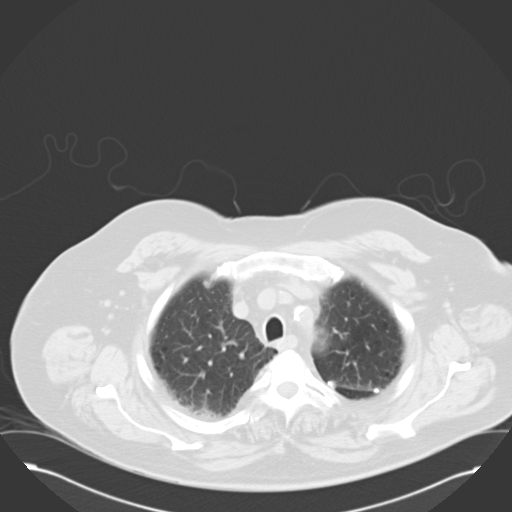
[im 50/60  mediastinal]
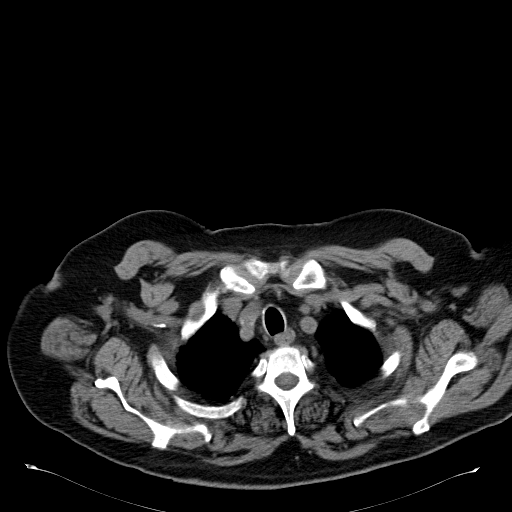
[im 50/60  lung]
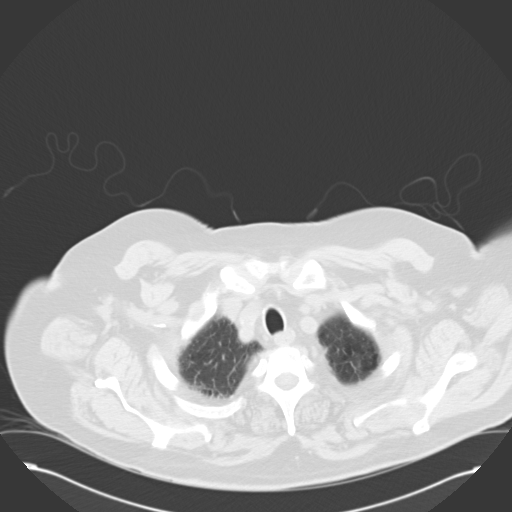
[im 55/60  lung]
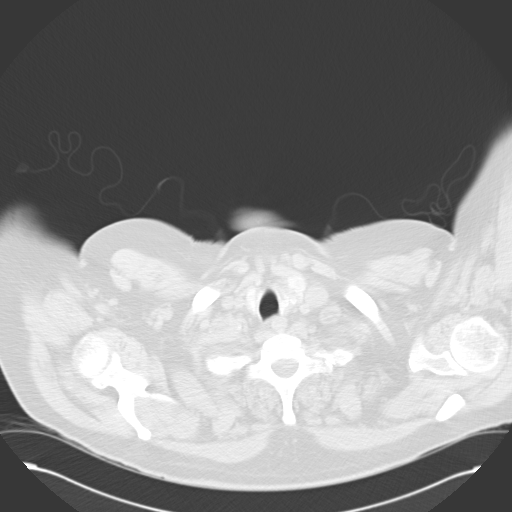

[14 of 32 positions shown; findings below may reference images not displayed]

FINDINGS: Moderate coronary artery calcifications are identified.
There is no evidence of pleural or pericardial effusion.
No enlarged lymph nodes are present.

Mild basilar atelectasis is noted.
Mild paraseptal emphysema is noted.
There is no evidence of dominant mass, suspicious nodule, airspace
disease or consolidation.

No acute or suspicious bony abnormalities are identified.
IMPRESSION: Mild bibasilar atelectasis without other acute abnormality.

Mild paraseptal emphysema.

## 2014-05-11 IMAGING — CR DG CHEST 2V
2 series · 2 of 2 positions shown · non-contrast
Comparison: 02/18/2013 and multiple prior chest radiographs

CLINICAL DATA: Altered mental status, fever and shortness of breath

CHEST - 2 VIEW

[w chest pa]
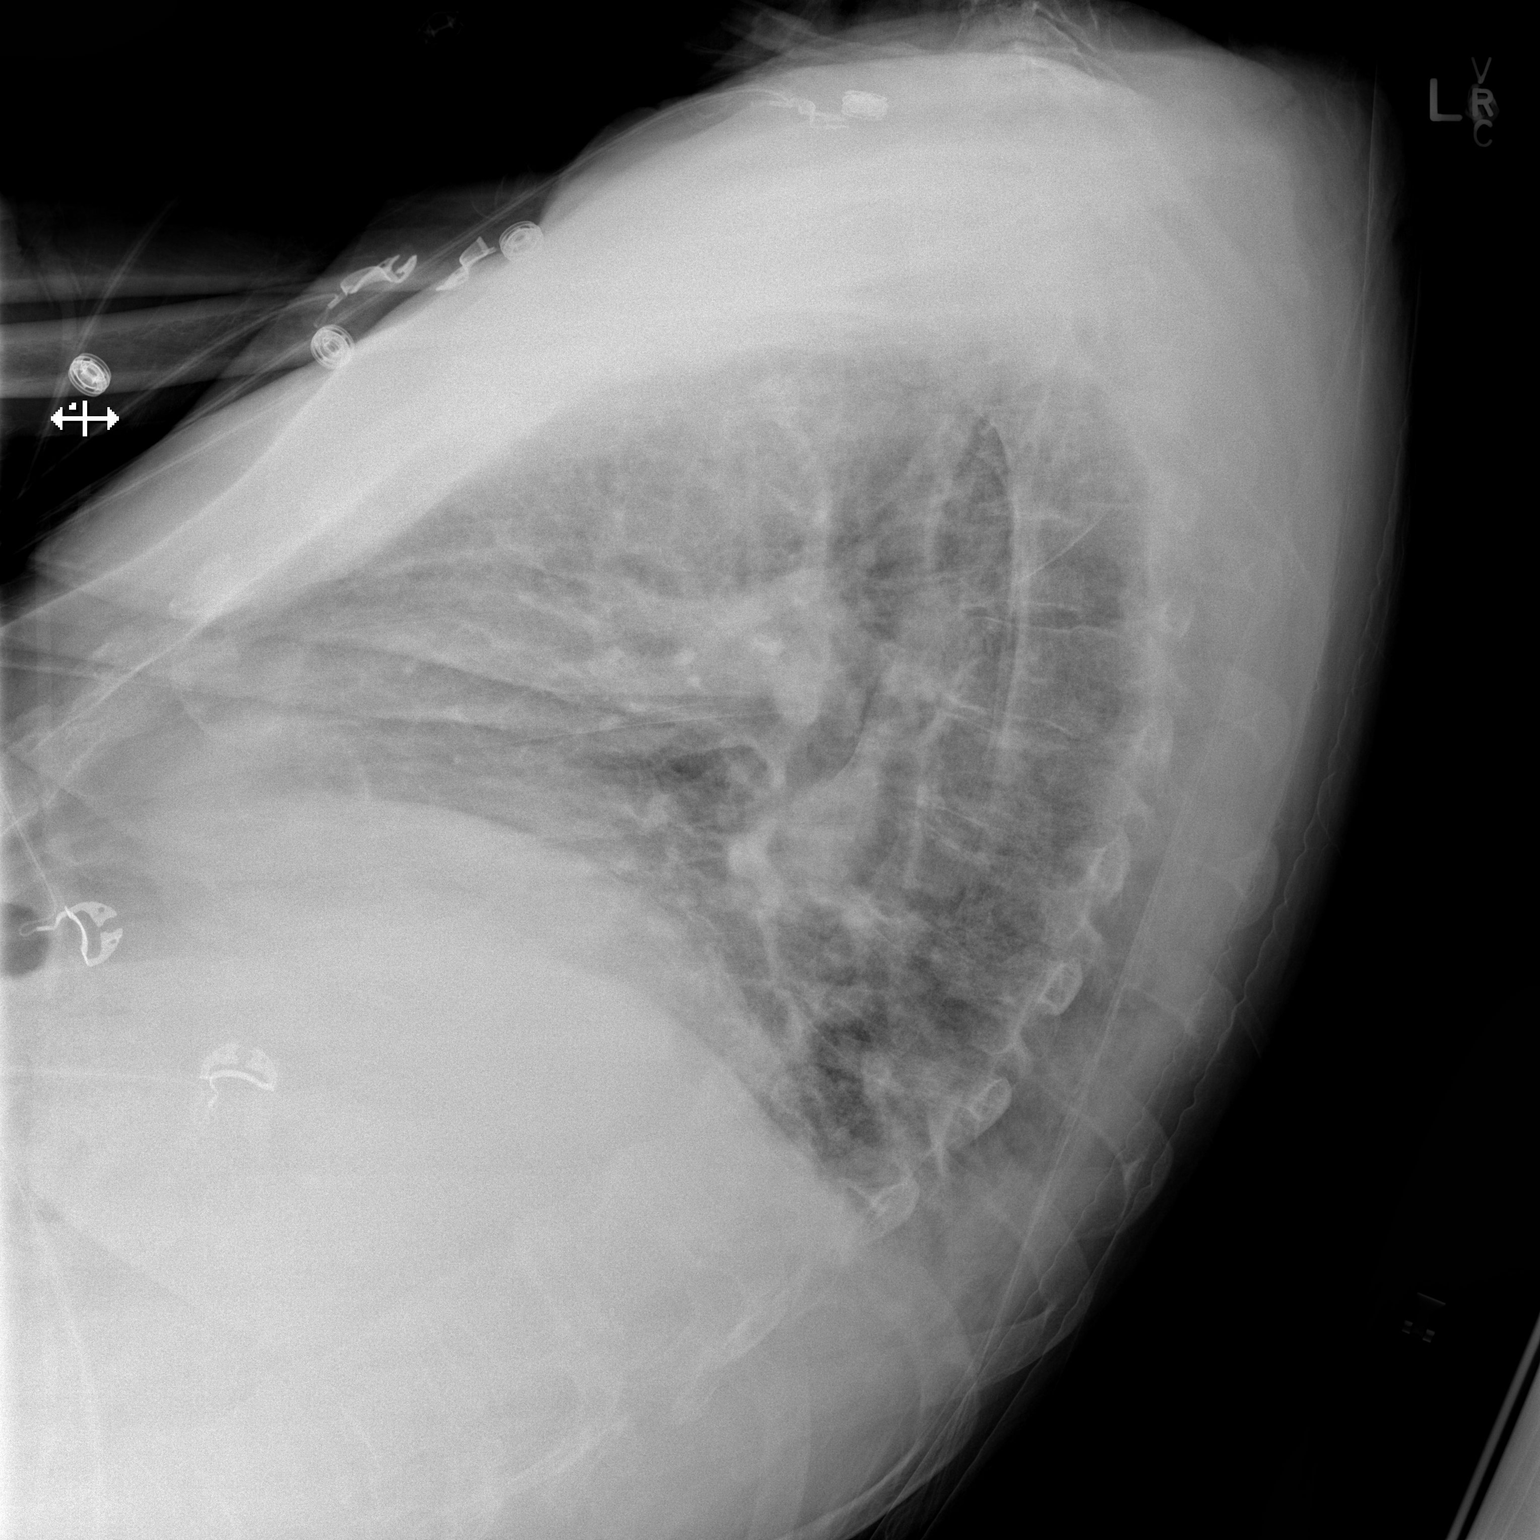

[x chest ap]
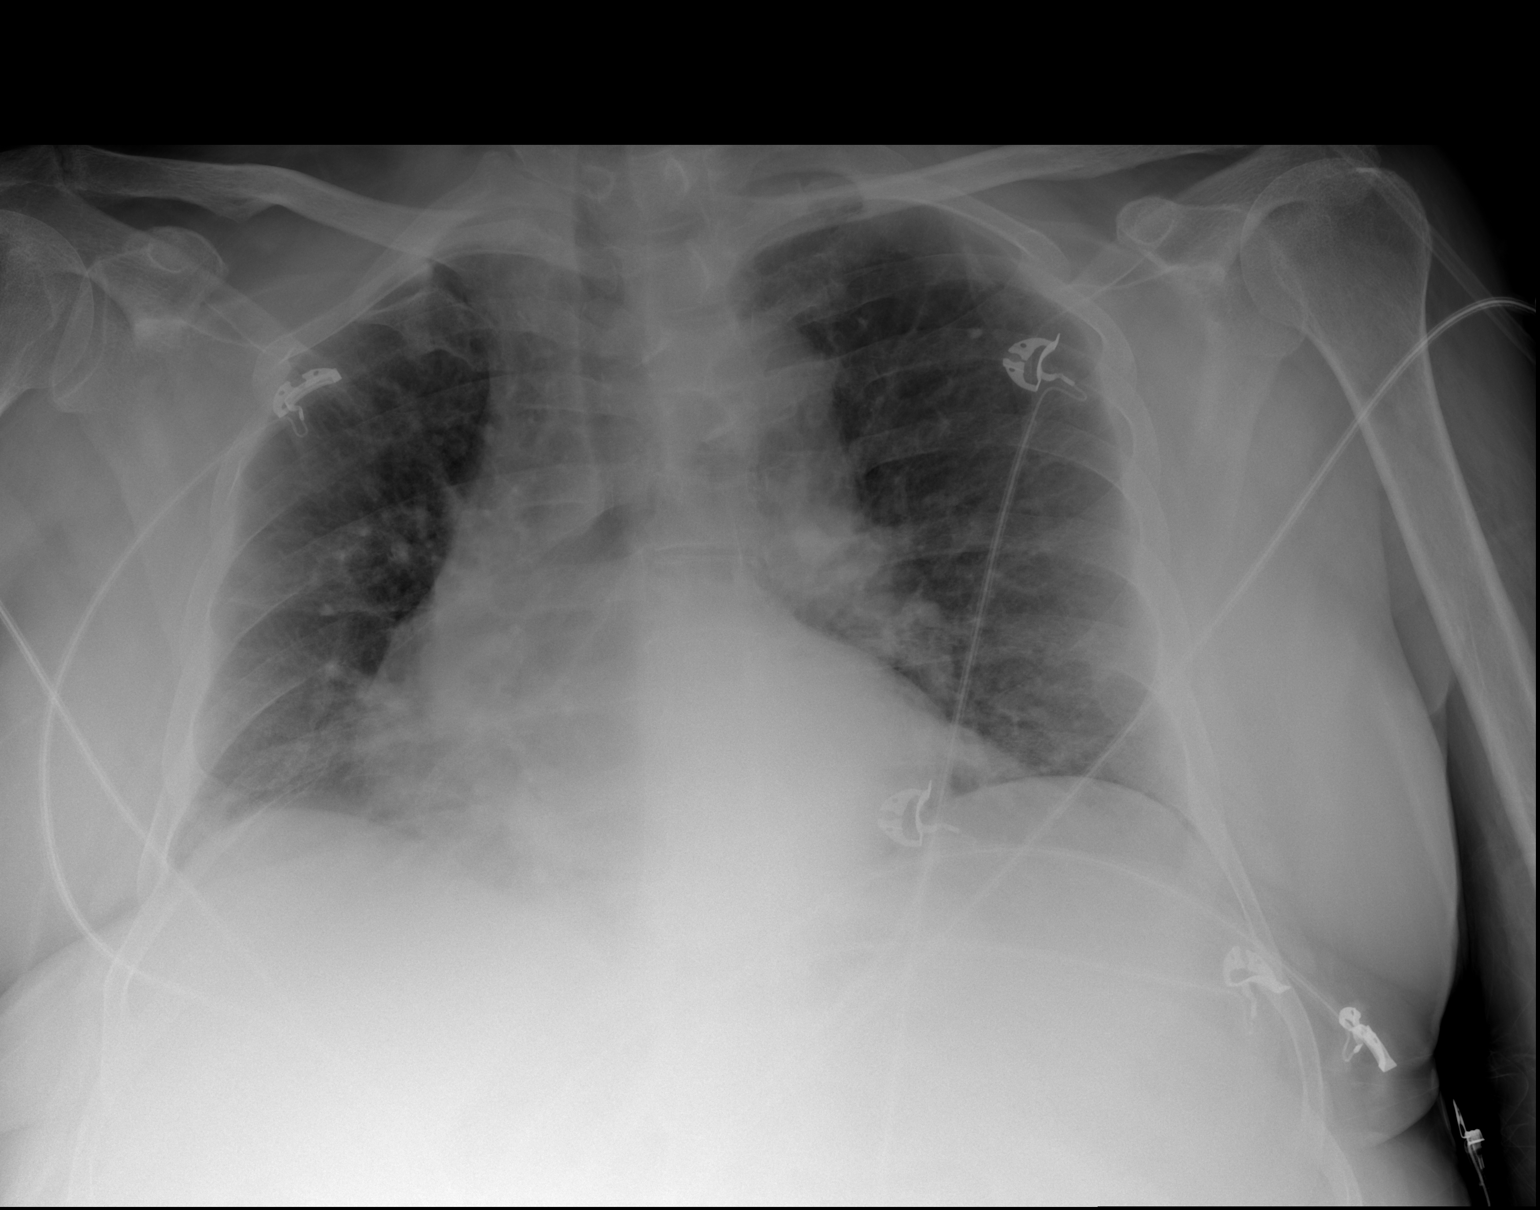

[2 of 2 positions shown; findings below may reference images not displayed]

FINDINGS: Cardiomegaly and peribronchial thickening again noted.
This is a mildly low volume film.
Mild bibasilar atelectasis is identified.
There is no evidence of focal airspace disease, pulmonary edema,
suspicious pulmonary nodule/mass, pleural effusion, or
pneumothorax.
No acute bony abnormalities are identified.
IMPRESSION: Cardiomegaly with mild bibasilar atelectasis.
